# Patient Record
Sex: Female | Born: 1975 | Race: White | Hispanic: No | Marital: Married | Smoking: Never smoker
Health system: Southern US, Community
[De-identification: ages and names within clinical notes are randomized; demographics above are authoritative.]

## PROBLEM LIST (undated history)

## (undated) DIAGNOSIS — R112 Nausea with vomiting, unspecified: Secondary | ICD-10-CM

## (undated) DIAGNOSIS — T7840XA Allergy, unspecified, initial encounter: Secondary | ICD-10-CM

## (undated) DIAGNOSIS — K219 Gastro-esophageal reflux disease without esophagitis: Secondary | ICD-10-CM

## (undated) DIAGNOSIS — G43909 Migraine, unspecified, not intractable, without status migrainosus: Secondary | ICD-10-CM

## (undated) DIAGNOSIS — F419 Anxiety disorder, unspecified: Secondary | ICD-10-CM

## (undated) DIAGNOSIS — I729 Aneurysm of unspecified site: Secondary | ICD-10-CM

## (undated) DIAGNOSIS — I871 Compression of vein: Secondary | ICD-10-CM

## (undated) DIAGNOSIS — Z9889 Other specified postprocedural states: Secondary | ICD-10-CM

## (undated) DIAGNOSIS — I10 Essential (primary) hypertension: Secondary | ICD-10-CM

## (undated) HISTORY — DX: Anxiety disorder, unspecified: F41.9

## (undated) HISTORY — PX: RENAL ANGIOPLASTY: SHX2316

## (undated) HISTORY — DX: Allergy, unspecified, initial encounter: T78.40XA

## (undated) HISTORY — PX: ABDOMINAL HYSTERECTOMY: SHX81

## (undated) HISTORY — DX: Migraine, unspecified, not intractable, without status migrainosus: G43.909

## (undated) HISTORY — PX: APPENDECTOMY: SHX54

## (undated) HISTORY — PX: TUBAL LIGATION: SHX77

## (undated) HISTORY — DX: Aneurysm of unspecified site: I72.9

## (undated) HISTORY — DX: Essential (primary) hypertension: I10

## (undated) HISTORY — DX: Gastro-esophageal reflux disease without esophagitis: K21.9

## (undated) HISTORY — PX: COLONOSCOPY: SHX174

---

## 2004-09-30 HISTORY — PX: OTHER SURGICAL HISTORY: SHX169

## 2014-07-31 LAB — HM MAMMOGRAPHY: HM Mammogram: NORMAL

## 2014-07-31 LAB — HM PAP SMEAR: HM Pap smear: NORMAL

## 2015-01-29 DIAGNOSIS — I729 Aneurysm of unspecified site: Secondary | ICD-10-CM

## 2015-01-29 HISTORY — DX: Aneurysm of unspecified site: I72.9

## 2015-01-29 HISTORY — PX: OTHER SURGICAL HISTORY: SHX169

## 2015-05-18 ENCOUNTER — Ambulatory Visit (INDEPENDENT_AMBULATORY_CARE_PROVIDER_SITE_OTHER): Payer: Managed Care, Other (non HMO) | Admitting: Neurology

## 2015-05-18 ENCOUNTER — Encounter: Payer: Self-pay | Admitting: Neurology

## 2015-05-18 VITALS — BP 126/68 | HR 66 | Resp 20 | Ht 65.0 in | Wt 150.8 lb

## 2015-05-18 DIAGNOSIS — R51 Headache: Secondary | ICD-10-CM

## 2015-05-18 DIAGNOSIS — I7771 Dissection of carotid artery: Secondary | ICD-10-CM

## 2015-05-18 DIAGNOSIS — G4441 Drug-induced headache, not elsewhere classified, intractable: Secondary | ICD-10-CM

## 2015-05-18 DIAGNOSIS — R519 Headache, unspecified: Secondary | ICD-10-CM

## 2015-05-18 DIAGNOSIS — G4440 Drug-induced headache, not elsewhere classified, not intractable: Secondary | ICD-10-CM

## 2015-05-18 MED ORDER — TRAMADOL HCL 50 MG PO TABS: 50.0000 mg | ORAL_TABLET | Freq: Three times a day (TID) | ORAL | Status: DC | PRN

## 2015-05-18 MED ORDER — TOPIRAMATE 50 MG PO TABS: 50.0000 mg | ORAL_TABLET | Freq: Two times a day (BID) | ORAL | Status: DC

## 2015-05-18 NOTE — Progress Notes (Addendum)
NEUROLOGY CONSULTATION NOTE  Samadhi Mahurin MRN: [id number] DOB: Jun 18, 1976  Referring provider: Dr. Gean Maidens (prior neurologist in [place]) Primary care provider: none  Reason for consult:  headache  HISTORY OF PRESENT ILLNESS: Jessica Molina is a 39 year old right-handed female with hypertension and hypotension and recent left internal carotid artery dissection who presents for headache.  History obtained by patient and prior neurologist's notes.  Reports of CTA of neck, MRI and MRA of head reviewed.  In February, she was in the hospital for hypotension.  She has history of fluctuating blood pressure and her blood pressure was in the 50s/30s.  She had several falls.  While in the hospital, it was noted that she had asymmetric pupils.  She denied focal numbness or weakness.  MRI of the brain was unremarkable.  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  She was started on ASA.  Repeat CTA of neck from 01/03/15 showed "mild irregularity of the distal cervical left ICA without significant stenosis, as well as a pseudoaneurysm projecting posteriorly from the distal cervical segment measuring up to 3.3 x 7.3 cm".  She underwent stent and embolization of the left ICA in May by Dr. Darnell Level.  Follow up CTA of the brain on 02/14/15 showed status post stenting of the upper cervical ICA with small residual pseudoaneurysm along the posterior wall, decreased from previous exam.  She was instructed that she could discontinue ASA in late July.  Since February, she has had chronic headaches.  They are located on top and at the crown of her head.  They are of a non-throbbing quality  There were initially severe and constant.  They have since improved.  They are now usually 4-6/10 intensity and rarely severe.  They are rarely associated with nausea but she sometimes notes photophobia or phonophobia.  They typically last  hours to one day.  They still occur daily.  She takes tramadol 50mg  and was taking it daily up until a week ago when she ran out.  She also takes topiramate 50mg  twice daily.  Prior medications included ibuprofen, Tylenol, Excedrin and Fioricet.  She does report past history of migraines when she was younger and she had headaches during her pregnancy with her twins.  She has a follow up with the vascular surgeon, Dr. Adela Glimpse, in December.  PAST MEDICAL HISTORY: Past Medical History  Diagnosis Date  . Headache   . Aneurysm     PAST SURGICAL HISTORY: Past Surgical History  Procedure Laterality Date  . Appendectomy    . Cesarean section      MEDICATIONS: No current outpatient prescriptions on file prior to visit.   No current facility-administered medications on file prior to visit.    ALLERGIES: Allergies  Allergen Reactions  . Lisinopril Cough    FAMILY HISTORY: Family History  Problem Relation Age of Onset  . Cancer Mother     breast   . Thyroid disease Mother   . Hypertension Father   . Stroke Father   . Cancer Father     melanoma   . Cancer Maternal Grandmother     breast   . Thyroid disease Maternal Grandmother   . Cancer Maternal Grandfather     unknown   . Cancer Paternal Grandmother     unknow     SOCIAL HISTORY: Social History   Social History  . Marital Status: Unknown    Spouse Name: N/A  .  Number of Children: N/A  . Years of Education: N/A   Occupational History  . Not on file.   Social History Main Topics  . Smoking status: Never Smoker   . Smokeless tobacco: Never Used  . Alcohol Use: 0.0 oz/week    0 Standard drinks or equivalent per week     Comment: social   . Drug Use: No  . Sexual Activity:    Partners: Male   Other Topics Concern  . Not on file   Social History Narrative  . No narrative on file    REVIEW OF SYSTEMS: Constitutional: No fevers, chills, or sweats, no generalized fatigue, change in appetite Eyes: No visual  changes, double vision, eye pain Ear, nose and throat: No hearing loss, ear pain, nasal congestion, sore throat Cardiovascular: No chest pain, palpitations Respiratory:  No shortness of breath at rest or with exertion, wheezes GastrointestinaI: No nausea, vomiting, diarrhea, abdominal pain, fecal incontinence Genitourinary:  No dysuria, urinary retention or frequency Musculoskeletal:  No neck pain, back pain Integumentary: No rash, pruritus, skin lesions Neurological: as above Psychiatric: No depression, insomnia, anxiety Endocrine: No palpitations, fatigue, diaphoresis, mood swings, change in appetite, change in weight, increased thirst Hematologic/Lymphatic:  No anemia, purpura, petechiae. Allergic/Immunologic: no itchy/runny eyes, nasal congestion, recent allergic reactions, rashes  PHYSICAL EXAM: Filed Vitals:   05/18/15 0829  BP: 126/68  Pulse: 66  Resp: 20   General: No acute distress.  Patient appears well-groomed.  Head:  Normocephalic/atraumatic Eyes:  fundi unremarkable, without vessel changes, exudates, hemorrhages or papilledema. Neck: supple, no paraspinal tenderness, full range of motion Back: No paraspinal tenderness Heart: regular rate and rhythm Lungs: Clear to auscultation bilaterally. Vascular: No carotid bruits. Neurological Exam: Mental status: alert and oriented to person, place, and time, recent and remote memory intact, fund of knowledge intact, attention and concentration intact, speech fluent and not dysarthric, language intact. Cranial nerves: CN I: not tested CN II: OD 2.20mm, OS 2mm, round and reactive to light, visual fields intact, fundi unremarkable, without vessel changes, exudates, hemorrhages or papilledema. CN III, IV, VI:  full range of motion, no nystagmus, no ptosis CN V: facial sensation intact CN VII: upper and lower face symmetric CN VIII: hearing intact CN IX, X: gag intact, uvula midline CN XI: sternocleidomastoid and trapezius muscles  intact CN XII: tongue midline Bulk & Tone: normal, no fasciculations. Motor:  5/5 throughout Sensation:  Pinprick and vibration intact Deep Tendon Reflexes:  2+ throughout, toes downgoing Finger to nose testing:  intact Heel to shin:  intact Gait:  Normal station and stride.  Able to turn and tandem walk. Romberg negative.  IMPRESSION: History of left internal carotid artery dissection, possibly traumatic from one of her falls Chronic daily headache, complicated by medication overuse  Hypertension/fluctuating blood pressure  PLAN: 1.  Continue topiramate  twice daily 2.  Tramadol  as needed for headache, limited to no more than 2 days out of the week. 3.  Will refer to establish with PCP to manage her other medications, such as her antihypertensive medications. 4.  Follow up with Dr. Adela Glimpse as scheduled 5.  Follow up with me in 3 months.  Thank you for allowing me to take part in the care of this patient.  Shon Millet, DO  CC: Gean Maidens, MD  Darnell Level, MD

## 2015-05-18 NOTE — Patient Instructions (Signed)
1.  Continue topiramate  twice daily 2.  Tramadol  as needed up to every 8 hours but take no more than 2 days out of the week 3.  Establish care with primary care provider 4.  Follow up in 3 months.

## 2015-05-22 ENCOUNTER — Telehealth: Payer: Self-pay | Admitting: *Deleted

## 2015-05-22 ENCOUNTER — Encounter: Payer: Self-pay | Admitting: *Deleted

## 2015-05-22 ENCOUNTER — Ambulatory Visit: Payer: 59 | Admitting: Family Medicine

## 2015-05-22 NOTE — Telephone Encounter (Signed)
Pre-Visit Call completed with patient and chart updated.   Pre-Visit Info documented in Specialty Comments under [employer].    

## 2015-05-23 ENCOUNTER — Encounter: Payer: Self-pay | Admitting: Family Medicine

## 2015-05-23 ENCOUNTER — Ambulatory Visit (INDEPENDENT_AMBULATORY_CARE_PROVIDER_SITE_OTHER): Payer: Managed Care, Other (non HMO) | Admitting: Family Medicine

## 2015-05-23 VITALS — BP 128/87 | HR 75 | Temp 98.5°F | Ht 63.0 in | Wt 153.0 lb

## 2015-05-23 DIAGNOSIS — R51 Headache: Secondary | ICD-10-CM

## 2015-05-23 DIAGNOSIS — R519 Headache, unspecified: Secondary | ICD-10-CM

## 2015-05-23 DIAGNOSIS — Z8742 Personal history of other diseases of the female genital tract: Secondary | ICD-10-CM

## 2015-05-23 DIAGNOSIS — G4700 Insomnia, unspecified: Secondary | ICD-10-CM | POA: Diagnosis not present

## 2015-05-23 DIAGNOSIS — I150 Renovascular hypertension: Secondary | ICD-10-CM | POA: Diagnosis not present

## 2015-05-23 DIAGNOSIS — I10 Essential (primary) hypertension: Secondary | ICD-10-CM | POA: Insufficient documentation

## 2015-05-23 DIAGNOSIS — R110 Nausea: Secondary | ICD-10-CM | POA: Diagnosis not present

## 2015-05-23 DIAGNOSIS — Z808 Family history of malignant neoplasm of other organs or systems: Secondary | ICD-10-CM

## 2015-05-23 DIAGNOSIS — I7771 Dissection of carotid artery: Secondary | ICD-10-CM | POA: Insufficient documentation

## 2015-05-23 MED ORDER — ZOLPIDEM TARTRATE ER 12.5 MG PO TBCR: EXTENDED_RELEASE_TABLET | ORAL | Status: DC

## 2015-05-23 MED ORDER — ALPRAZOLAM 0.25 MG PO TABS: 0.2500 mg | ORAL_TABLET | Freq: Two times a day (BID) | ORAL | Status: DC | PRN

## 2015-05-23 MED ORDER — PROMETHAZINE HCL 25 MG PO TABS: ORAL_TABLET | ORAL | Status: DC

## 2015-05-23 MED ORDER — ESCITALOPRAM OXALATE 10 MG PO TABS: 10.0000 mg | ORAL_TABLET | Freq: Every day | ORAL | Status: DC

## 2015-05-23 NOTE — Assessment & Plan Note (Signed)
Cont norvasc and chlorthiadone Per nephrology

## 2015-05-23 NOTE — Progress Notes (Signed)
Pre visit review using our clinic review tool, if applicable. No additional management support is needed unless otherwise documented below in the visit note. 

## 2015-05-23 NOTE — Assessment & Plan Note (Signed)
Per neuro 

## 2015-05-23 NOTE — Patient Instructions (Addendum)

## 2015-05-23 NOTE — Progress Notes (Signed)
Patient ID: Jessica Molina, female   DOB: 04/18/1976, 39 y.o.   MRN: [id number]   Jessica Molina  female [id number] Jul 22, 1976 39 y.o. 05/23/2015      Progress Note-Follow Up  Subjective   HPI  Patient is in today to establish.  She has a hx of renovascular htn and carotid dissection.   Chief Complaint  Patient presents with  . Establish Care    Wants to discuss medication's    Past Medical History  Diagnosis Date  . Headache   . Aneurysm   . Hypertension     Past Surgical History  Procedure Laterality Date  . Appendectomy    . Cesarean section    . Renal angioplasty      Family History  Problem Relation Age of Onset  . Breast cancer Mother     breast   . Thyroid disease Mother   . Cancer Mother 8    breast  . Hypertension Father   . Stroke Father   . Melanoma Father     melanoma   . Cancer Father     melanoma  . Breast cancer Maternal Grandmother     breast   . Thyroid disease Maternal Grandmother   . Cancer Maternal Grandfather     unknown   . Cancer Paternal Grandmother     unknow     Social History   Social History  . Marital Status: Unknown    Spouse Name: N/A  . Number of Children: N/A  . Years of Education: N/A   Occupational History  . Not on file.   Social History Main Topics  . Smoking status: Never Smoker   . Smokeless tobacco: Never Used  . Alcohol Use: 0.0 oz/week    0 Standard drinks or equivalent per week     Comment: social   . Drug Use: No  . Sexual Activity:    Partners: Male   Other Topics Concern  . Not on file   Social History Narrative    Current Outpatient Prescriptions on File Prior to Visit  Medication Sig Dispense Refill  . amLODipine (NORVASC) 10 MG tablet once daily.     . chlorthalidone (HYGROTON) 25 MG tablet Take 25 mg by mouth daily as needed.     Marland Kitchen losartan (COZAAR) 100 MG tablet Take 100 mg by mouth daily.    Marland Kitchen oxyCODONE-acetaminophen (PERCOCET/ROXICET) 5-325 MG per tablet ! tablet by motuh as  needed    . topiramate (TOPAMAX) 50 MG tablet Take 1 tablet (50 mg total) by mouth 2 (two) times daily. 60 tablet 6  . traMADol (ULTRAM) 50 MG tablet Take 1 tablet (50 mg total) by mouth every 8 (eight) hours as needed. 240 tablet 0   No current facility-administered medications on file prior to visit.    Allergies  Allergen Reactions  . Lisinopril Cough    Review of Systems  Review of Systems  Constitutional: Negative for fever, chills and malaise/fatigue.  HENT: Negative for congestion and hearing loss.   Eyes: Negative for discharge.  Respiratory: Negative for cough, sputum production and shortness of breath.   Cardiovascular: Negative for chest pain, palpitations and leg swelling.  Gastrointestinal: Negative for heartburn, nausea, vomiting, abdominal pain, diarrhea, constipation and blood in stool.  Genitourinary: Negative for dysuria, urgency, frequency and hematuria.  Musculoskeletal: Negative for myalgias, back pain and falls.  Skin: Negative for rash.  Neurological: Negative for dizziness, sensory change, loss of consciousness, weakness and headaches.  Endo/Heme/Allergies: Negative for environmental allergies.  Does not bruise/bleed easily.  Psychiatric/Behavioral: Negative for depression and suicidal ideas. The patient is not nervous/anxious and does not have insomnia.     Objective  Filed Vitals:   05/23/15 0837  BP: 128/87  Pulse: 75  Temp: 98.5 F (36.9 C)  TempSrc: Oral  Height: 5' 3" (1.6 m)  Weight: 153 lb (69.4 kg)  SpO2: 100%   Body mass index is 27.11 kg/(m^2).  Physical Exam  Physical Exam  Constitutional: She is oriented to person, place, and time and well-developed, well-nourished, and in no distress. No distress.  HENT:  Right Ear: External ear normal.  Left Ear: External ear normal.  Nose: Nose normal.  Mouth/Throat: Oropharynx is clear and moist.  Eyes: EOM are normal. Pupils are equal, round, and reactive to light. Right eye exhibits no  discharge. Left eye exhibits no discharge.  Neck: Normal range of motion. Neck supple.  Cardiovascular: Normal rate, regular rhythm and intact distal pulses.   No murmur heard. Pulmonary/Chest: Effort normal. No respiratory distress. She has no wheezes. She has no rales. She exhibits no tenderness.  Abdominal: Soft. She exhibits no distension and no mass. There is no tenderness. There is no rebound and no guarding.  Musculoskeletal: Normal range of motion. She exhibits no edema or tenderness.  Lymphadenopathy:    She has no cervical adenopathy.  Neurological: She is alert and oriented to person, place, and time. Gait normal.  Skin: No rash noted. She is not diaphoretic. No erythema.  Psychiatric: Affect and judgment normal.  Nursing note and vitals reviewed.   No results found for: TSH No results found for: WBC, HGB, HCT, MCV, PLT No results found for: EGFR, GFR No results found for: CHOL No results found for: HDL No results found for: LDLCALC No results found for: TRIG No results found for: CHOLHDL No results found for: HGBA1C    Assessment & Plan  1. Insomnia Refill ambien  - zolpidem (AMBIEN CR) 12.5 MG CR tablet; 1 tab by mouth nightly  Dispense: 30 tablet; Refill: 0  2. Nausea without vomiting  - promethazine (PHENERGAN) 25 MG tablet; Take 1 tablet by mouth once or twice a week as needed  Dispense: 30 tablet; Refill: 0  3. Hx carotid dissection-- per neurosurgeon in [place]  4 renovascular htn-- con't norvasc and chlorthiadone per nephrology

## 2015-05-23 NOTE — Assessment & Plan Note (Signed)
Surgery done in [place]  Still sees him

## 2015-06-28 ENCOUNTER — Other Ambulatory Visit: Payer: Self-pay | Admitting: Family Medicine

## 2015-06-28 NOTE — Telephone Encounter (Signed)
Pt was seen on 05/23/15 Last refill was 05/23/15  Please advise on refill.

## 2015-06-29 MED ORDER — ALPRAZOLAM 0.25 MG PO TABS: 0.2500 mg | ORAL_TABLET | Freq: Two times a day (BID) | ORAL | Status: DC | PRN

## 2015-06-29 MED ORDER — ZOLPIDEM TARTRATE ER 12.5 MG PO TBCR: 12.5000 mg | EXTENDED_RELEASE_TABLET | Freq: Every day | ORAL | Status: DC

## 2015-06-29 NOTE — Addendum Note (Signed)
Addended by: Neldon Labella on: 06/29/2015 08:15 AM   Modules accepted: Orders

## 2015-07-12 ENCOUNTER — Other Ambulatory Visit (HOSPITAL_COMMUNITY): Payer: Self-pay | Admitting: Neurosurgery

## 2015-07-12 DIAGNOSIS — I720 Aneurysm of carotid artery: Secondary | ICD-10-CM

## 2015-07-19 ENCOUNTER — Ambulatory Visit (HOSPITAL_COMMUNITY): Payer: Managed Care, Other (non HMO)

## 2015-07-25 ENCOUNTER — Ambulatory Visit (HOSPITAL_COMMUNITY): Payer: Managed Care, Other (non HMO)

## 2015-07-27 ENCOUNTER — Ambulatory Visit (HOSPITAL_COMMUNITY): Payer: Managed Care, Other (non HMO)

## 2015-08-01 ENCOUNTER — Other Ambulatory Visit: Payer: Self-pay

## 2015-08-01 MED ORDER — TOPIRAMATE 50 MG PO TABS: 50.0000 mg | ORAL_TABLET | Freq: Two times a day (BID) | ORAL | Status: DC

## 2015-08-01 NOTE — Telephone Encounter (Signed)
Last OV: 05/18/15 Next OV: 08/22/15

## 2015-08-02 ENCOUNTER — Other Ambulatory Visit: Payer: Self-pay | Admitting: Family Medicine

## 2015-08-02 ENCOUNTER — Ambulatory Visit (HOSPITAL_COMMUNITY)
Admission: RE | Admit: 2015-08-02 | Discharge: 2015-08-02 | Disposition: A | Discharge status: Home / Self Care | Payer: Managed Care, Other (non HMO) | Source: Ambulatory Visit | Attending: Neurosurgery | Admitting: Neurosurgery

## 2015-08-02 ENCOUNTER — Encounter (HOSPITAL_COMMUNITY): Payer: Self-pay

## 2015-08-02 DIAGNOSIS — R51 Headache: Secondary | ICD-10-CM | POA: Insufficient documentation

## 2015-08-02 DIAGNOSIS — I720 Aneurysm of carotid artery: Secondary | ICD-10-CM | POA: Diagnosis present

## 2015-08-02 DIAGNOSIS — I773 Arterial fibromuscular dysplasia: Secondary | ICD-10-CM | POA: Insufficient documentation

## 2015-08-02 DIAGNOSIS — Z95828 Presence of other vascular implants and grafts: Secondary | ICD-10-CM | POA: Diagnosis not present

## 2015-08-02 MED ORDER — IOHEXOL 350 MG/ML SOLN
100.0000 mL | Freq: Once | INTRAVENOUS | Status: AC | PRN
  Administered 2015-08-02: 100 mL via INTRAVENOUS

## 2015-08-03 NOTE — Telephone Encounter (Signed)
Last seen 05/23/15 and filled 06/29/15 #30  Please advise     KP

## 2015-08-15 ENCOUNTER — Other Ambulatory Visit: Payer: Self-pay

## 2015-08-15 MED ORDER — ESCITALOPRAM OXALATE 10 MG PO TABS: 10.0000 mg | ORAL_TABLET | Freq: Every day | ORAL | Status: DC

## 2015-08-22 ENCOUNTER — Ambulatory Visit: Payer: 59 | Admitting: Neurology

## 2015-08-29 ENCOUNTER — Other Ambulatory Visit: Payer: Self-pay | Admitting: Family Medicine

## 2015-08-30 NOTE — Telephone Encounter (Signed)
Last OV 05/23/15 Alprazolam last filled 06/29/15 #30 with 0 ambien last filled 08/03/15 #30 with 0  No CSC on file

## 2015-08-30 NOTE — Telephone Encounter (Signed)
Medication filled to pharmacy as requested.   

## 2015-09-02 ENCOUNTER — Other Ambulatory Visit: Payer: Self-pay | Admitting: Family Medicine

## 2015-09-04 ENCOUNTER — Other Ambulatory Visit: Payer: Self-pay | Admitting: Family Medicine

## 2015-09-04 MED ORDER — ZOLPIDEM TARTRATE ER 12.5 MG PO TBCR: 12.5000 mg | EXTENDED_RELEASE_TABLET | Freq: Every day | ORAL | Status: DC

## 2015-09-04 NOTE — Addendum Note (Signed)
Addended by: Geannie RisenBRODMERKEL, JESSICA L on: 09/04/2015 04:29 PM   Modules accepted: Orders

## 2015-09-04 NOTE — Telephone Encounter (Signed)
Medication filled to pharmacy as requested.   

## 2015-09-04 NOTE — Telephone Encounter (Signed)
Ok for Ambien refill #30

## 2015-09-04 NOTE — Telephone Encounter (Signed)
Last OV 823/16 ambien last filled 08/30/15 #30 with 0

## 2015-09-04 NOTE — Telephone Encounter (Signed)
Please advise, two requests come in on this/

## 2015-09-04 NOTE — Telephone Encounter (Signed)
Med denied, faxed to pharmacy on 08/30/15

## 2015-09-04 NOTE — Telephone Encounter (Signed)
Pt called in to fu on Rx refill request. Pt says that she is now all out. She says that the pharmacy asked that she call in to request refill.   Medication: AMBIEN CR  CB: [phone]  Pharmacy: [id number] - [place], [place] - [address]

## 2015-10-02 ENCOUNTER — Other Ambulatory Visit: Payer: Self-pay | Admitting: Physician Assistant

## 2015-10-02 NOTE — Telephone Encounter (Signed)
Will defer further refills of patient's medications to PCP  

## 2015-10-03 NOTE — Telephone Encounter (Signed)
Last seen 05/23/15 and filled 08/30/15 #30  Please advise     KP

## 2015-10-05 ENCOUNTER — Other Ambulatory Visit: Payer: Self-pay | Admitting: Family Medicine

## 2015-10-25 ENCOUNTER — Other Ambulatory Visit: Payer: Self-pay | Admitting: Neurology

## 2015-10-25 NOTE — Telephone Encounter (Signed)
Last OV: 05/18/15 Next OV: 12/19/15 1.  Continue topiramate  twice daily

## 2015-11-07 ENCOUNTER — Other Ambulatory Visit (HOSPITAL_COMMUNITY)
Admission: RE | Admit: 2015-11-07 | Discharge: 2015-11-07 | Disposition: A | Discharge status: Home / Self Care | Payer: Managed Care, Other (non HMO) | Source: Ambulatory Visit | Attending: Women's Health | Admitting: Women's Health

## 2015-11-07 ENCOUNTER — Encounter: Payer: Self-pay | Admitting: Women's Health

## 2015-11-07 ENCOUNTER — Ambulatory Visit (INDEPENDENT_AMBULATORY_CARE_PROVIDER_SITE_OTHER): Payer: Managed Care, Other (non HMO) | Admitting: Women's Health

## 2015-11-07 ENCOUNTER — Other Ambulatory Visit: Payer: Self-pay | Admitting: Family Medicine

## 2015-11-07 VITALS — BP 128/80 | Ht 63.0 in | Wt 136.0 lb

## 2015-11-07 DIAGNOSIS — Z01419 Encounter for gynecological examination (general) (routine) without abnormal findings: Secondary | ICD-10-CM | POA: Diagnosis not present

## 2015-11-07 DIAGNOSIS — Z1151 Encounter for screening for human papillomavirus (HPV): Secondary | ICD-10-CM | POA: Insufficient documentation

## 2015-11-07 DIAGNOSIS — N920 Excessive and frequent menstruation with regular cycle: Secondary | ICD-10-CM

## 2015-11-07 LAB — CBC WITH DIFFERENTIAL/PLATELET
Basophils Absolute: 0 10*3/uL (ref 0.0–0.1)
Basophils Relative: 0 % (ref 0–1)
Eosinophils Absolute: 0 10*3/uL (ref 0.0–0.7)
Eosinophils Relative: 0 % (ref 0–5)
HCT: 45.8 % (ref 36.0–46.0)
Hemoglobin: 14.9 g/dL (ref 12.0–15.0)
Lymphocytes Relative: 17 % (ref 12–46)
Lymphs Abs: 1.5 10*3/uL (ref 0.7–4.0)
MCH: 27.7 pg (ref 26.0–34.0)
MCHC: 32.5 g/dL (ref 30.0–36.0)
MCV: 85.3 fL (ref 78.0–100.0)
MPV: 11.1 fL (ref 8.6–12.4)
Monocytes Absolute: 0.4 10*3/uL (ref 0.1–1.0)
Monocytes Relative: 4 % (ref 3–12)
Neutro Abs: 7 10*3/uL (ref 1.7–7.7)
Neutrophils Relative %: 79 % — ABNORMAL HIGH (ref 43–77)
Platelets: 438 10*3/uL — ABNORMAL HIGH (ref 150–400)
RBC: 5.37 MIL/uL — ABNORMAL HIGH (ref 3.87–5.11)
RDW: 14.1 % (ref 11.5–15.5)
WBC: 8.9 10*3/uL (ref 4.0–10.5)

## 2015-11-07 LAB — TSH: TSH: 0.72 mIU/L

## 2015-11-07 LAB — PROLACTIN: Prolactin: 3 ng/mL

## 2015-11-07 NOTE — Patient Instructions (Addendum)
Menorrhagia Menorrhagia is the medical term for when your menstrual periods are heavy or last longer than usual. With menorrhagia, every period you have may cause enough blood loss and cramping that you are unable to maintain your usual activities. CAUSES  In some cases, the cause of heavy periods is unknown, but a number of conditions may cause menorrhagia. Common causes include:  A problem with the hormone-producing thyroid gland (hypothyroid).  Noncancerous growths in the uterus (polyps or fibroids).  An imbalance of the estrogen and progesterone hormones.  One of your ovaries not releasing an egg during one or more months.  Side effects of having an intrauterine device (IUD).  Side effects of some medicines, such as anti-inflammatory medicines or blood thinners.  A bleeding disorder that stops your blood from clotting normally. SIGNS AND SYMPTOMS  During a normal period, bleeding lasts between 4 and 8 days. Signs that your periods are too heavy include:  You routinely have to change your pad or tampon every 1 or 2 hours because it is completely soaked.  You pass blood clots larger than 1 inch (2.5 cm) in size.  You have bleeding for more than 7 days.  You need to use pads and tampons at the same time because of heavy bleeding.  You need to wake up to change your pads or tampons during the night.  You have symptoms of anemia, such as tiredness, fatigue, or shortness of breath. DIAGNOSIS  Your health care provider will perform a physical exam and ask you questions about your symptoms and menstrual history. Other tests may be ordered based on what the health care provider finds during the exam. These tests can include:  Blood tests. Blood tests are used to check if you are pregnant or have hormonal changes, a bleeding or thyroid disorder, low iron levels (anemia), or other problems.  Endometrial biopsy. Your health care provider takes a sample of tissue from the inside of your  uterus to be examined under a microscope.  Pelvic ultrasound. This test uses sound waves to make a picture of your uterus, ovaries, and vagina. The pictures can show if you have fibroids or other growths.  Hysteroscopy. For this test, your health care provider will use a small telescope to look inside your uterus. Based on the results of your initial tests, your health care provider may recommend further testing. TREATMENT  Treatment may not be needed. If it is needed, your health care provider may recommend treatment with one or more medicines first. If these do not reduce bleeding enough, a surgical treatment might be an option. The best treatment for you will depend on:   Whether you need to prevent pregnancy.  Your desire to have children in the future.  The cause and severity of your bleeding.  Your opinion and personal preference.  Medicines for menorrhagia may include:  Birth control methods that use hormones. These include the pill, skin patch, vaginal ring, shots that you get every 3 months, hormonal IUD, and implant. These treatments reduce bleeding during your menstrual period.  Medicines that thicken blood and slow bleeding.  Medicines that reduce swelling, such as ibuprofen.  Medicines that contain a synthetic hormone called progestin.   Medicines that make the ovaries stop working for a short time.  You may need surgical treatment for menorrhagia if the medicines are unsuccessful. Treatment options include:  Dilation and curettage (D&C). In this procedure, your health care provider opens (dilates) your cervix and then scrapes or suctions tissue from  the lining of your uterus to reduce menstrual bleeding.  Operative hysteroscopy. This procedure uses a tiny tube with a light (hysteroscope) to view your uterine cavity and can help in the surgical removal of a polyp that may be causing heavy periods.  Endometrial ablation. Through various techniques, your health care  provider permanently destroys the entire lining of your uterus (endometrium). After endometrial ablation, most women have little or no menstrual flow. Endometrial ablation reduces your ability to become pregnant.  Endometrial resection. This surgical procedure uses an electrosurgical wire loop to remove the lining of the uterus. This procedure also reduces your ability to become pregnant.  Hysterectomy. Surgical removal of the uterus and cervix is a permanent procedure that stops menstrual periods. Pregnancy is not possible after a hysterectomy. This procedure requires anesthesia and hospitalization. HOME CARE INSTRUCTIONS   Only take over-the-counter or prescription medicines as directed by your health care provider. Take prescribed medicines exactly as directed. Do not change or switch medicines without consulting your health care provider.  Take any prescribed iron pills exactly as directed by your health care provider. Long-term heavy bleeding may result in low iron levels. Iron pills help replace the iron your body lost from heavy bleeding. Iron may cause constipation. If this becomes a problem, increase the bran, fruits, and roughage in your diet.  Do not take aspirin or medicines that contain aspirin 1 week before or during your menstrual period. Aspirin may make the bleeding worse.  If you need to change your sanitary pad or tampon more than once every 2 hours, stay in bed and rest as much as possible until the bleeding stops.  Eat well-balanced meals. Eat foods high in iron. Examples are leafy green vegetables, meat, liver, eggs, and whole grain breads and cereals. Do not try to lose weight until the abnormal bleeding has stopped and your blood iron level is back to normal. SEEK MEDICAL CARE IF:   You soak through a pad or tampon every 1 or 2 hours, and this happens every time you have a period.  You need to use pads and tampons at the same time because you are bleeding so much.  You  need to change your pad or tampon during the night.  You have a period that lasts for more than 8 days.  You pass clots bigger than 1 inch wide.  You have irregular periods that happen more or less often than once a month.  You feel dizzy or faint.  You feel very weak or tired.  You feel short of breath or feel your heart is beating too fast when you exercise.  You have nausea and vomiting or diarrhea while you are taking your medicine.  You have any problems that may be related to the medicine you are taking. SEEK IMMEDIATE MEDICAL CARE IF:   You soak through 4 or more pads or tampons in 2 hours.  You have any bleeding while you are pregnant. MAKE SURE YOU:   Understand these instructions.  Will watch your condition.  Will get help right away if you are not doing well or get worse.   This information is not intended to replace advice given to you by your health care provider. Make sure you discuss any questions you have with your health care provider.   Document Released: 09/16/2005 Document Revised: 09/21/2013 Document Reviewed: 03/07/2013 Elsevier Interactive Patient Education 2016 [place] Maintenance, Female Adopting a healthy lifestyle and getting preventive care can go a long way  to promote health and wellness. Talk with your health care provider about what schedule of regular examinations is right for you. This is a good chance for you to check in with your provider about disease prevention and staying healthy. In between checkups, there are plenty of things you can do on your own. Experts have done a lot of research about which lifestyle changes and preventive measures are most likely to keep you healthy. Ask your health care provider for more information. WEIGHT AND DIET  Eat a healthy diet  Be sure to include plenty of vegetables, fruits, low-fat dairy products, and lean protein.  Do not eat a lot of foods high in solid fats, added sugars, or  salt.  Get regular exercise. This is one of the most important things you can do for your health.  Most adults should exercise for at least 150 minutes each week. The exercise should increase your heart rate and make you sweat (moderate-intensity exercise).  Most adults should also do strengthening exercises at least twice a week. This is in addition to the moderate-intensity exercise.  Maintain a healthy weight  Body mass index (BMI) is a measurement that can be used to identify possible weight problems. It estimates body fat based on height and weight. Your health care provider can help determine your BMI and help you achieve or maintain a healthy weight.  For females 37 years of age and older:   A BMI below 18.5 is considered underweight.  A BMI of 18.5 to 24.9 is normal.  A BMI of 25 to 29.9 is considered overweight.  A BMI of 30 and above is considered obese.  Watch levels of cholesterol and blood lipids  You should start having your blood tested for lipids and cholesterol at 41 years of age, then have this test every 5 years.  You may need to have your cholesterol levels checked more often if:  Your lipid or cholesterol levels are high.  You are older than 41 years of age.  You are at high risk for heart disease.  CANCER SCREENING   Lung Cancer  Lung cancer screening is recommended for adults 43-97 years old who are at high risk for lung cancer because of a history of smoking.  A yearly low-dose CT scan of the lungs is recommended for people who:  Currently smoke.  Have quit within the past 15 years.  Have at least a 30-pack-year history of smoking. A pack year is smoking an average of one pack of cigarettes a day for 1 year.  Yearly screening should continue until it has been 15 years since you quit.  Yearly screening should stop if you develop a health problem that would prevent you from having lung cancer treatment.  Breast Cancer  Practice breast  self-awareness. This means understanding how your breasts normally appear and feel.  It also means doing regular breast self-exams. Let your health care provider know about any changes, no matter how small.  If you are in your 20s or 30s, you should have a clinical breast exam (CBE) by a health care provider every 1-3 years as part of a regular health exam.  If you are 74 or older, have a CBE every year. Also consider having a breast X-ray (mammogram) every year.  If you have a family history of breast cancer, talk to your health care provider about genetic screening.  If you are at high risk for breast cancer, talk to your health care provider about having  an MRI and a mammogram every year.  Breast cancer gene (BRCA) assessment is recommended for women who have family members with BRCA-related cancers. BRCA-related cancers include:  Breast.  Ovarian.  Tubal.  Peritoneal cancers.  Results of the assessment will determine the need for genetic counseling and BRCA1 and BRCA2 testing. Cervical Cancer Your health care provider may recommend that you be screened regularly for cancer of the pelvic organs (ovaries, uterus, and vagina). This screening involves a pelvic examination, including checking for microscopic changes to the surface of your cervix (Pap test). You may be encouraged to have this screening done every 3 years, beginning at age 52.  For women ages 30-65, health care providers may recommend pelvic exams and Pap testing every 3 years, or they may recommend the Pap and pelvic exam, combined with testing for human papilloma virus (HPV), every 5 years. Some types of HPV increase your risk of cervical cancer. Testing for HPV may also be done on women of any age with unclear Pap test results.  Other health care providers may not recommend any screening for nonpregnant women who are considered low risk for pelvic cancer and who do not have symptoms. Ask your health care provider if a  screening pelvic exam is right for you.  If you have had past treatment for cervical cancer or a condition that could lead to cancer, you need Pap tests and screening for cancer for at least 20 years after your treatment. If Pap tests have been discontinued, your risk factors (such as having a new sexual partner) need to be reassessed to determine if screening should resume. Some women have medical problems that increase the chance of getting cervical cancer. In these cases, your health care provider may recommend more frequent screening and Pap tests. Colorectal Cancer  This type of cancer can be detected and often prevented.  Routine colorectal cancer screening usually begins at 41 years of age and continues through 41 years of age.  Your health care provider may recommend screening at an earlier age if you have risk factors for colon cancer.  Your health care provider may also recommend using home test kits to check for hidden blood in the stool.  A small camera at the end of a tube can be used to examine your colon directly (sigmoidoscopy or colonoscopy). This is done to check for the earliest forms of colorectal cancer.  Routine screening usually begins at age 8.  Direct examination of the colon should be repeated every 5-10 years through 41 years of age. However, you may need to be screened more often if early forms of precancerous polyps or small growths are found. Skin Cancer  Check your skin from head to toe regularly.  Tell your health care provider about any new moles or changes in moles, especially if there is a change in a mole's shape or color.  Also tell your health care provider if you have a mole that is larger than the size of a pencil eraser.  Always use sunscreen. Apply sunscreen liberally and repeatedly throughout the day.  Protect yourself by wearing long sleeves, pants, a wide-brimmed hat, and sunglasses whenever you are outside. HEART DISEASE, DIABETES, AND HIGH  BLOOD PRESSURE   High blood pressure causes heart disease and increases the risk of stroke. High blood pressure is more likely to develop in:  People who have blood pressure in the high end of the normal range (130-139/85-89 mm Hg).  People who are overweight or obese.  People who are African American.  If you are 85-51 years of age, have your blood pressure checked every 3-5 years. If you are 92 years of age or older, have your blood pressure checked every year. You should have your blood pressure measured twice--once when you are at a hospital or clinic, and once when you are not at a hospital or clinic. Record the average of the two measurements. To check your blood pressure when you are not at a hospital or clinic, you can use:  An automated blood pressure machine at a pharmacy.  A home blood pressure monitor.  If you are between 16 years and 78 years old, ask your health care provider if you should take aspirin to prevent strokes.  Have regular diabetes screenings. This involves taking a blood sample to check your fasting blood sugar level.  If you are at a normal weight and have a low risk for diabetes, have this test once every three years after 41 years of age.  If you are overweight and have a high risk for diabetes, consider being tested at a younger age or more often. PREVENTING INFECTION  Hepatitis B  If you have a higher risk for hepatitis B, you should be screened for this virus. You are considered at high risk for hepatitis B if:  You were born in a country where hepatitis B is common. Ask your health care provider which countries are considered high risk.  Your parents were born in a high-risk country, and you have not been immunized against hepatitis B (hepatitis B vaccine).  You have HIV or AIDS.  You use needles to inject street drugs.  You live with someone who has hepatitis B.  You have had sex with someone who has hepatitis B.  You get hemodialysis  treatment.  You take certain medicines for conditions, including cancer, organ transplantation, and autoimmune conditions. Hepatitis C  Blood testing is recommended for:  Everyone born from [birth year] through 1966.  Anyone with known risk factors for hepatitis C. Sexually transmitted infections (STIs)  You should be screened for sexually transmitted infections (STIs) including gonorrhea and chlamydia if:  You are sexually active and are younger than 41 years of age.  You are older than 41 years of age and your health care provider tells you that you are at risk for this type of infection.  Your sexual activity has changed since you were last screened and you are at an increased risk for chlamydia or gonorrhea. Ask your health care provider if you are at risk.  If you do not have HIV, but are at risk, it may be recommended that you take a prescription medicine daily to prevent HIV infection. This is called pre-exposure prophylaxis (PrEP). You are considered at risk if:  You are sexually active and do not regularly use condoms or know the HIV status of your partner(s).  You take drugs by injection.  You are sexually active with a partner who has HIV. Talk with your health care provider about whether you are at high risk of being infected with HIV. If you choose to begin PrEP, you should first be tested for HIV. You should then be tested every 3 months for as long as you are taking PrEP.  PREGNANCY   If you are premenopausal and you may become pregnant, ask your health care provider about preconception counseling.  If you may become pregnant, take 400 to 800 micrograms (mcg) of folic acid every day.  If you  want to prevent pregnancy, talk to your health care provider about birth control (contraception). OSTEOPOROSIS AND MENOPAUSE   Osteoporosis is a disease in which the bones lose minerals and strength with aging. This can result in serious bone fractures. Your risk for osteoporosis can  be identified using a bone density scan.  If you are 25 years of age or older, or if you are at risk for osteoporosis and fractures, ask your health care provider if you should be screened.  Ask your health care provider whether you should take a calcium or vitamin D supplement to lower your risk for osteoporosis.  Menopause may have certain physical symptoms and risks.  Hormone replacement therapy may reduce some of these symptoms and risks. Talk to your health care provider about whether hormone replacement therapy is right for you.  HOME CARE INSTRUCTIONS   Schedule regular health, dental, and eye exams.  Stay current with your immunizations.   Do not use any tobacco products including cigarettes, chewing tobacco, or electronic cigarettes.  If you are pregnant, do not drink alcohol.  If you are breastfeeding, limit how much and how often you drink alcohol.  Limit alcohol intake to no more than 1 drink per day for nonpregnant women. One drink equals 12 ounces of beer, 5 ounces of wine, or 1 ounces of hard liquor.  Do not use street drugs.  Do not share needles.  Ask your health care provider for help if you need support or information about quitting drugs.  Tell your health care provider if you often feel depressed.  Tell your health care provider if you have ever been abused or do not feel safe at home.   This information is not intended to replace advice given to you by your health care provider. Make sure you discuss any questions you have with your health care provider.   Document Released: 04/01/2011 Document Revised: 10/07/2014 Document Reviewed: 08/18/2013 Elsevier Interactive Patient Education 2016 [place]   [id number]

## 2015-11-07 NOTE — Telephone Encounter (Signed)
Last seen 05/23/15 and filled 10/05/15 #30  Please advise    KP

## 2015-11-07 NOTE — Addendum Note (Signed)
Addended by: Kem Parkinson on: 11/07/2015 09:48 AM   Modules accepted: Orders

## 2015-11-07 NOTE — Progress Notes (Signed)
Jessica Molina December 01, 1975 [id number]    History:    Presents for annual exam.  Regular monthly cycle, for the past 5 months cycles have been lasting up to 2 weeks heavy changing pad and tampon every 1-2 hours on the heaviest days. BTL. Cycles lasting 6-7 days prior. Same partner greater than 17 years. 2004 LEEP in [place] with normal Paps after. Had a normal screening mammogram. Mother died of breast cancer with metastasis to bone, first diagnosed with breast cancer at about age 24. Recently moved here from [place].   Past medical history, past surgical history, family history and social history were all reviewed and documented in the EPIC chart. CNA. 4 daughters ages 23, 76, 38 year old twins.  ROS:  A ROS was performed and pertinent positives and negatives are included.  Exam:  Filed Vitals:   11/07/15 0854  BP: 128/80    General appearance:  Normal Thyroid:  Symmetrical, normal in size, without palpable masses or nodularity. Respiratory  Auscultation:  Clear without wheezing or rhonchi Cardiovascular  Auscultation:  Regular rate, without rubs, murmurs or gallops  Edema/varicosities:  Not grossly evident Abdominal  Soft,nontender, without masses, guarding or rebound.  Liver/spleen:  No organomegaly noted  Hernia:  None appreciated  Skin  Inspection:  Grossly normal   Breasts: Examined lying and sitting.     Right: Without masses, retractions, discharge or axillary adenopathy.     Left: Without masses, retractions, discharge or axillary adenopathy. Gentitourinary   Inguinal/mons:  Normal without inguinal adenopathy  External genitalia:  Normal  BUS/Urethra/Skene's glands:  Normal  Vagina:  Normal  Cervix:  Normal  Uterus:  Retroverted, normal in size, shape and contour.  Midline and mobile  Adnexa/parametria:     Rt: Without masses or tenderness.   Lt: Without masses or tenderness.  Anus and perineum: Normal  Digital rectal exam: Normal sphincter tone without palpated  masses or tenderness  Assessment/Plan:  40 y.o. MWF G3 P4 for annual exam.   Monthly cycle, past 5 months lasting 2 weeks heavy./Dyspareunia/BTL 2004 LEEP normal Paps after Mother breast cancer age 62 BRCA unknown deceased Hypertension-managed by [facility] meds and  labs  Plan: CBC, lipid panel, TSH, UA, Pap with HR HPV typing. Schedule sonohysterogram with Dr. Phineas Real after next cycle. SBE's, annual screening mammogram 3-D tomography reviewed and encouraged breast center information given instructed to schedule. Encouraged regular exercise, calcium rich diet, MVI daily, vitamin D 1000 daily encouraged.    Huel Cote [facility], 9:37 AM 11/07/2015

## 2015-11-08 ENCOUNTER — Telehealth: Payer: Self-pay | Admitting: Family Medicine

## 2015-11-08 LAB — URINALYSIS W MICROSCOPIC + REFLEX CULTURE
Bacteria, UA: NONE SEEN [HPF]
Bilirubin Urine: NEGATIVE
Casts: NONE SEEN [LPF]
Crystals: NONE SEEN [HPF]
Glucose, UA: NEGATIVE
Hgb urine dipstick: NEGATIVE
Ketones, ur: NEGATIVE
Leukocytes, UA: NEGATIVE
Nitrite: NEGATIVE
Protein, ur: NEGATIVE
Specific Gravity, Urine: 1.017 (ref 1.001–1.035)
pH: 8 (ref 5.0–8.0)

## 2015-11-08 LAB — CYTOLOGY - PAP

## 2015-11-08 NOTE — Telephone Encounter (Signed)
Updated.      KP 

## 2015-11-08 NOTE — Telephone Encounter (Signed)
Patient had her Flu Shot in late December 2016 @ [facility]

## 2015-11-09 ENCOUNTER — Other Ambulatory Visit: Payer: Self-pay | Admitting: Women's Health

## 2015-11-09 ENCOUNTER — Other Ambulatory Visit: Payer: Self-pay

## 2015-11-09 DIAGNOSIS — Z1231 Encounter for screening mammogram for malignant neoplasm of breast: Secondary | ICD-10-CM

## 2015-11-09 LAB — URINE CULTURE: Colony Count: >=100000

## 2015-11-09 MED ORDER — FLUCONAZOLE 150 MG PO TABS: 150.0000 mg | ORAL_TABLET | Freq: Once | ORAL | Status: DC

## 2015-11-11 ENCOUNTER — Other Ambulatory Visit: Payer: Self-pay | Admitting: Neurology

## 2015-11-13 ENCOUNTER — Other Ambulatory Visit: Payer: Self-pay | Admitting: Gynecology

## 2015-11-13 DIAGNOSIS — N939 Abnormal uterine and vaginal bleeding, unspecified: Secondary | ICD-10-CM

## 2015-11-13 NOTE — Telephone Encounter (Signed)
Last OV: 05/18/15 Next OV: 12/19/15

## 2015-11-21 ENCOUNTER — Telehealth: Payer: Self-pay | Admitting: Gynecology

## 2015-11-21 NOTE — Telephone Encounter (Signed)
11/21/15-I told pt today that her [employer] ins will cover the sonohysterogram and bx if needed with her $30 copay. Per [phone] [email or web address]

## 2015-11-23 ENCOUNTER — Ambulatory Visit: Payer: Managed Care, Other (non HMO) | Admitting: Family Medicine

## 2015-11-23 ENCOUNTER — Other Ambulatory Visit: Payer: Managed Care, Other (non HMO)

## 2015-11-23 ENCOUNTER — Ambulatory Visit: Payer: Managed Care, Other (non HMO) | Admitting: Gynecology

## 2015-11-27 ENCOUNTER — Other Ambulatory Visit: Payer: Self-pay | Admitting: Family Medicine

## 2015-11-27 NOTE — Telephone Encounter (Signed)
Last seen 05/23/15 and filled 10/03/15 #30  Please advise    KP

## 2015-11-27 NOTE — Telephone Encounter (Signed)
Faxed.   KP 

## 2015-11-29 ENCOUNTER — Other Ambulatory Visit: Payer: Self-pay | Admitting: Gynecology

## 2015-11-29 ENCOUNTER — Encounter: Payer: Self-pay | Admitting: Gynecology

## 2015-11-29 ENCOUNTER — Ambulatory Visit (INDEPENDENT_AMBULATORY_CARE_PROVIDER_SITE_OTHER): Payer: Managed Care, Other (non HMO)

## 2015-11-29 ENCOUNTER — Ambulatory Visit (INDEPENDENT_AMBULATORY_CARE_PROVIDER_SITE_OTHER): Payer: Managed Care, Other (non HMO) | Admitting: Gynecology

## 2015-11-29 VITALS — BP 120/76

## 2015-11-29 DIAGNOSIS — N920 Excessive and frequent menstruation with regular cycle: Secondary | ICD-10-CM

## 2015-11-29 DIAGNOSIS — N939 Abnormal uterine and vaginal bleeding, unspecified: Secondary | ICD-10-CM | POA: Diagnosis not present

## 2015-11-29 NOTE — Patient Instructions (Signed)
Office will call with the biopsy results.  You will be thinking about her options to include observation, hormonal manipulation, Mirena IUD, endometrial ablation, hysterectomy.

## 2015-11-29 NOTE — Progress Notes (Signed)
Kiannah Grunow [birth year] [id number]        40 y.o.  [name] presents for sonohysterogram. Recently saw [place] with a history of worsening menorrhagia requiring double to triple protection with bleed through episodes. Sometimes menses would last up to 2 weeks.  They do occur monthly without intermenstrual bleeding. Recent hemoglobin 14 with normal TSH. History of C-section 1 for twins at approximately 32 weeks with 2 other vaginal deliveries.  Past medical history,surgical history, problem list, medications, allergies, family history and social history were all reviewed and documented in the EPIC chart.  Directed ROS with pertinent positives and negatives documented in the history of present illness/assessment and plan.  Exam: [place] assistant Filed Vitals:   11/29/15 1132  BP: 120/76   General appearance:  Normal Abdomen soft nontender without masses guarding rebound pelvic external BUS vagina normal. Cervix normal. Uterus anteverted normal size midline mobile nontender. Adnexa without masses or tenderness.  Ultrasound shows uterus normal size and echotexture. Endometrial echo 16 mm. Right and left ovaries normal. Cul-de-sac negative.  Sonohysterogram performed, sterile technique, easy catheter introduction, good distention with no abnormalities. Endometrial sample taken. Patient tolerated well.  Assessment/Plan:  40 y.o. [id number] with worsening menorrhagia. Ultrasound/sonohysterogram normal. Reviewed options with the patient to include observation, hormonal manipulation, Mirena IUD, endometrial ablation, hysterectomy. The pros/cons, risks/benefits of each choice reviewed. Is status post BTL with no future childbearing desires. 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 Patient will think of her options and she will follow up with the biopsy results in  several days.    Dara Lords MD, 12:01 PM 11/29/2015

## 2015-11-30 ENCOUNTER — Ambulatory Visit
Admission: RE | Admit: 2015-11-30 | Discharge: 2015-11-30 | Disposition: A | Discharge status: Home / Self Care | Payer: Managed Care, Other (non HMO) | Source: Ambulatory Visit

## 2015-11-30 DIAGNOSIS — Z1231 Encounter for screening mammogram for malignant neoplasm of breast: Secondary | ICD-10-CM

## 2015-12-05 ENCOUNTER — Ambulatory Visit: Payer: Managed Care, Other (non HMO) | Admitting: Family Medicine

## 2015-12-05 ENCOUNTER — Telehealth: Payer: Self-pay

## 2015-12-05 NOTE — Telephone Encounter (Signed)
Patient called with some general questions about surgery and recovery for hysterectomy.  I could not be specific since not stated how hysterectomy will be performed but I did provider her with ballpark estimate regarding stay and recovery.  She would like for me to check her ins benefits and send her a financial letter about benefits/surgery prepayment and I will get that out to her this week.  She knows she does not want surgery prior to May. She will consider and call me when she is ready to schedule.

## 2015-12-10 ENCOUNTER — Other Ambulatory Visit: Payer: Self-pay | Admitting: Family Medicine

## 2015-12-11 ENCOUNTER — Other Ambulatory Visit: Payer: Self-pay | Admitting: Family Medicine

## 2015-12-11 NOTE — Telephone Encounter (Signed)
Last seen 05/23/15 and filled 11/07/15 #30   Please advise     KP

## 2015-12-11 NOTE — Telephone Encounter (Signed)
Pt would like to know if she can receive a 90 day supply instead of 30. Pt says that she has to call in to often to refill. Pt would like to have confirmation when Rx is sent to pharmacy if possible .    Thanks.

## 2015-12-11 NOTE — Telephone Encounter (Signed)
We do not prescribe 90 days of Ambien.     KP

## 2015-12-12 ENCOUNTER — Telehealth: Payer: Self-pay

## 2015-12-12 NOTE — Telephone Encounter (Signed)
The Rx was faxed yesterday for 12.5 of Ambien. The patient is requesting refills on her Ambien.. Please advise    KP

## 2015-12-12 NOTE — Telephone Encounter (Signed)
Patient has decided to proceed with hysterectomy. Please send me your order sheet. Thanks.

## 2015-12-12 NOTE — Telephone Encounter (Signed)
Called pt. Explained to pt that PCP is only able to prescribe 30 days at a time. Pt would like to know if she can have refills on file instead to prevent her from having to call back so often.  I think I miss understood her question before.

## 2015-12-13 NOTE — Telephone Encounter (Signed)
Ok to put 1 refill

## 2015-12-18 ENCOUNTER — Encounter: Payer: Self-pay | Admitting: Family Medicine

## 2015-12-18 ENCOUNTER — Ambulatory Visit (INDEPENDENT_AMBULATORY_CARE_PROVIDER_SITE_OTHER): Payer: Managed Care, Other (non HMO) | Admitting: Family Medicine

## 2015-12-18 VITALS — BP 132/76 | HR 78 | Temp 98.8°F | Ht 63.0 in | Wt 127.8 lb

## 2015-12-18 DIAGNOSIS — G4700 Insomnia, unspecified: Secondary | ICD-10-CM | POA: Diagnosis not present

## 2015-12-18 DIAGNOSIS — I10 Essential (primary) hypertension: Secondary | ICD-10-CM | POA: Diagnosis not present

## 2015-12-18 MED ORDER — ZOLPIDEM TARTRATE ER 12.5 MG PO TBCR: 12.5000 mg | EXTENDED_RELEASE_TABLET | Freq: Every day | ORAL | Status: DC

## 2015-12-18 NOTE — Progress Notes (Signed)
Patient ID: Jessica Molina, female    DOB: [birth year]  Age: 40 y.o. MRN: [id number]    Subjective:  Subjective HPI Jessica Molina presents for insomnia and htn f/u.  Pt had fever and cough   Review of Systems  Constitutional: Negative for diaphoresis, appetite change, fatigue and unexpected weight change.  Eyes: Negative for pain, redness and visual disturbance.  Respiratory: Negative for cough, chest tightness, shortness of breath and wheezing.   Cardiovascular: Negative for chest pain, palpitations and leg swelling.  Endocrine: Negative for cold intolerance, heat intolerance, polydipsia, polyphagia and polyuria.  Genitourinary: Negative for dysuria, frequency and difficulty urinating.  Neurological: Negative for dizziness, light-headedness, numbness and headaches.    History Past Medical History  Diagnosis Date  . Headache   . Aneurysm (HCC)     Left carotid, repaired with no special restrictions or treatments to follow    She has past surgical history that includes Appendectomy; Renal angioplasty; ANEURYSM SURGERY (01/2015); Cesarean section; and BTL (2006).   Her family history includes Breast cancer in her maternal grandmother and mother; Cancer in her father, maternal grandfather, and paternal grandmother; Cancer (age of onset: 55) in her mother; Hypertension in her father; Melanoma in her father; Stroke in her father; Thyroid disease in her maternal grandmother and mother.She reports that she has never smoked. She has never used smokeless tobacco. She reports that she drinks alcohol. She reports that she does not use illicit drugs.  Current Outpatient Prescriptions on File Prior to Visit  Medication Sig Dispense Refill  . ALPRAZolam (XANAX) 0.25 MG tablet TAKE 1 TABLET TWICE A DAY AS NEEDED 30 tablet 0  . amLODipine (NORVASC) 10 MG tablet once daily.     . chlorthalidone (HYGROTON) 25 MG tablet Take 25 mg by mouth daily as needed.     Marland Kitchen losartan (COZAAR) 100 MG tablet Take 100  mg by mouth daily. Reported on 11/07/2015    . promethazine (PHENERGAN) 25 MG tablet Take 1 tablet by mouth once or twice a week as needed 30 tablet 0  . traMADol (ULTRAM) 50 MG tablet TAKE 1 TABLET BY MOUTH EVERY 8 HRS AS NEEDED 240 tablet 0   No current facility-administered medications on file prior to visit.     Objective:  Objective Physical Exam  Constitutional: She is oriented to person, place, and time. She appears well-developed and well-nourished.  HENT:  Head: Normocephalic and atraumatic.  Eyes: Conjunctivae and EOM are normal.  Neck: Normal range of motion. Neck supple. No JVD present. Carotid bruit is not present. No thyromegaly present.  Cardiovascular: Normal rate, regular rhythm and normal heart sounds.   No murmur heard. Pulmonary/Chest: Effort normal and breath sounds normal. No respiratory distress. She has no wheezes. She has no rales. She exhibits no tenderness.  Musculoskeletal: She exhibits no edema.  Neurological: She is alert and oriented to person, place, and time.  Psychiatric: She has a normal mood and affect.  Nursing note and vitals reviewed.  BP 132/76 mmHg  Pulse 78  Temp(Src) 98.8 F (37.1 C) (Oral)  Ht  (1.6 m)  Wt 127 lb 12.8 oz (57.97 kg)  BMI 22.64 kg/m2  SpO2 99%  LMP 11/15/2015 Wt Readings from Last 3 Encounters:  12/19/15 128 lb (58.06 kg)  12/18/15 127 lb 12.8 oz (57.97 kg)  11/07/15 136 lb (61.689 kg)     Lab Results  Component Value Date   WBC 8.9 11/07/2015   HGB 14.9 11/07/2015   HCT 45.8 11/07/2015  PLT 438* 11/07/2015   TSH 0.72 11/07/2015    Mm Screening Breast Tomo Bilateral  12/15/2015  CLINICAL DATA:  Screening. EXAM: DIGITAL SCREENING BILATERAL MAMMOGRAM WITH 3D TOMO WITH CAD COMPARISON:  Previous exam(s). ACR Breast Density Category b: There are scattered areas of fibroglandular density. FINDINGS: There are no findings suspicious for malignancy. Images were processed with CAD. IMPRESSION: No mammographic  evidence of malignancy. A result letter of this screening mammogram will be mailed directly to the patient. RECOMMENDATION: Screening mammogram in one year. (Code:SM-B-01Y) BI-RADS CATEGORY  1: Negative. Electronically Signed   By: Hulan Saashomas  Lawrence M.D.   On: 12/15/2015 08:17     Assessment & Plan:  Plan I have changed Jessica Molina's zolpidem. I am also having her maintain her amLODipine, chlorthalidone, losartan, promethazine, traMADol, and ALPRAZolam.  Meds ordered this encounter  Medications  . zolpidem (AMBIEN CR) 12.5 MG CR tablet    Sig: Take 1 tablet (12.5 mg total) by mouth at bedtime.    Dispense:  30 tablet    Refill:  2    Not to exceed 5 additional fills before 05/05/2016    Problem List Items Addressed This Visit    None    Visit Diagnoses    Insomnia    -  Primary    Relevant Medications    zolpidem (AMBIEN CR) 12.5 MG CR tablet       Follow-up: Return for annual exam, fasting.  Loreen FreudYvonne Lowne, DO

## 2015-12-18 NOTE — Progress Notes (Signed)
Pre visit review using our clinic review tool, if applicable. No additional management support is needed unless otherwise documented below in the visit note. 

## 2015-12-19 ENCOUNTER — Other Ambulatory Visit (INDEPENDENT_AMBULATORY_CARE_PROVIDER_SITE_OTHER): Payer: Managed Care, Other (non HMO)

## 2015-12-19 ENCOUNTER — Telehealth: Payer: Self-pay

## 2015-12-19 ENCOUNTER — Ambulatory Visit (INDEPENDENT_AMBULATORY_CARE_PROVIDER_SITE_OTHER): Payer: Managed Care, Other (non HMO) | Admitting: Neurology

## 2015-12-19 ENCOUNTER — Encounter: Payer: Self-pay | Admitting: Neurology

## 2015-12-19 VITALS — BP 122/84 | HR 71 | Ht 63.0 in | Wt 128.0 lb

## 2015-12-19 DIAGNOSIS — R202 Paresthesia of skin: Secondary | ICD-10-CM

## 2015-12-19 DIAGNOSIS — I7771 Dissection of carotid artery: Secondary | ICD-10-CM

## 2015-12-19 DIAGNOSIS — G43709 Chronic migraine without aura, not intractable, without status migrainosus: Secondary | ICD-10-CM | POA: Diagnosis not present

## 2015-12-19 DIAGNOSIS — G43109 Migraine with aura, not intractable, without status migrainosus: Secondary | ICD-10-CM

## 2015-12-19 LAB — VITAMIN B12: Vitamin B-12: 424 pg/mL (ref 211–911)

## 2015-12-19 MED ORDER — PROPRANOLOL HCL ER 60 MG PO CP24: 60.0000 mg | ORAL_CAPSULE | Freq: Every day | ORAL | Status: DC

## 2015-12-19 NOTE — Telephone Encounter (Signed)
Results were left on pt's voicemail, with instructions to call back with any questions or concerns in relation to results.   

## 2015-12-19 NOTE — Progress Notes (Addendum)
NEUROLOGY FOLLOW UP OFFICE NOTE  Jessica Molina [id number]  HISTORY OF PRESENT ILLNESS: Jessica Abboticole Hypes is a 40 year old right-handed female with hypertension and hypotension and recent left internal carotid artery dissection who follows up for chronic daily headaches.  UPDATE: She followed up with her vascular surgeon, Dr. Adela GlimpseBernard, in December.  CTA was unremarkable.  She was told to follow up with him as needed.  She is taking the topiramate 50mg  twice daily.  Headaches are pretty much unchanged.  They occur daily.  She limits intake of tramadol.  She reports a couple of new issues: Over the past 6 months, she has had episode of numbness and tingling involving the fingers and feet.  They occurred while in the gym and lasts all day.  Sometimes, the symptoms are so severe, she cannot keep her balance.  It has occurred about 5 times over the past 6 months.  She has had 2 episodes of visual disturbance.  She reports seeing wavy rainbows in her peripheral vision (right more than left).  Her right pupil dilates larger than baseline.  There is no associated headache.  It lasts about one hour.  HISTORY: In February, she was in the hospital for hypotension.  She has history of fluctuating blood pressure and her blood pressure was in the 50s/30s.  She had several falls.  While in the hospital, it was noted that she had asymmetric pupils.  She denied focal numbness or weakness.  MRI of the brain was unremarkable.  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  She was started on ASA.  Repeat CTA of neck from 01/03/15 showed "mild irregularity of the distal cervical left ICA without significant stenosis, as well as a pseudoaneurysm projecting posteriorly from the distal cervical segment measuring up to 3.3 x 7.3 cm".  She underwent stent and embolization of the left ICA in May by Dr. Darnell LevelJoe David Bernard.  Follow up CTA of the brain on  02/14/15 showed status post stenting of the upper cervical ICA with small residual pseudoaneurysm along the posterior wall, decreased from previous exam.  She was instructed that she could discontinue ASA in late July.  Since February, she has had chronic headaches.  They are located on top and at the crown of her head.  They are of a non-throbbing quality  There were initially severe and constant.  They have since improved.  At first office visit, they were usually 4-6/10 intensity and rarely severe.  They were rarely associated with nausea but she sometimes notes photophobia or phonophobia.  They typically last hours to one day.  They occur daily.  Prior medications included ibuprofen, Tylenol, Excedrin and Fioricet.  She does report past history of migraines when she was younger and she had headaches during her pregnancy with her twins.  PAST MEDICAL HISTORY: Past Medical History  Diagnosis Date  . Headache   . Aneurysm (HCC)     Left carotid, repaired with no special restrictions or treatments to follow    MEDICATIONS: Current Outpatient Prescriptions on File Prior to Visit  Medication Sig Dispense Refill  . ALPRAZolam (XANAX) 0.25 MG tablet TAKE 1 TABLET TWICE A DAY AS NEEDED 30 tablet 0  . amLODipine (NORVASC) 10 MG tablet once daily.     . chlorthalidone (HYGROTON) 25 MG tablet Take 25 mg by mouth daily as needed.     Marland Kitchen. losartan (COZAAR) 100 MG tablet Take 100 mg by mouth  daily. Reported on 11/07/2015    . promethazine (PHENERGAN) 25 MG tablet Take 1 tablet by mouth once or twice a week as needed 30 tablet 0  . traMADol (ULTRAM) 50 MG tablet TAKE 1 TABLET BY MOUTH EVERY 8 HRS AS NEEDED 240 tablet 0  . zolpidem (AMBIEN CR) 12.5 MG CR tablet Take 1 tablet (12.5 mg total) by mouth at bedtime. 30 tablet 2   No current facility-administered medications on file prior to visit.    ALLERGIES: Allergies  Allergen Reactions  . Lisinopril Cough  . Metoprolol Other (See Comments)    did not  feel well when taking     FAMILY HISTORY: Family History  Problem Relation Age of Onset  . Breast cancer Mother     breast   . Thyroid disease Mother   . Cancer Mother 61    breast  . Hypertension Father   . Stroke Father   . Melanoma Father     melanoma   . Cancer Father     melanoma  . Breast cancer Maternal Grandmother     breast   . Thyroid disease Maternal Grandmother   . Cancer Maternal Grandfather     unknown   . Cancer Paternal Grandmother     unknow     SOCIAL HISTORY: Social History   Social History  . Marital Status: Married    Spouse Name: N/A  . Number of Children: N/A  . Years of Education: N/A   Occupational History  . Not on file.   Social History Main Topics  . Smoking status: Never Smoker   . Smokeless tobacco: Never Used  . Alcohol Use: 0.0 oz/week    0 Standard drinks or equivalent per week     Comment: social   . Drug Use: No  . Sexual Activity:    Partners: Male   Other Topics Concern  . Not on file   Social History Narrative    REVIEW OF SYSTEMS: Constitutional: No fevers, chills, or sweats, no generalized fatigue, change in appetite Eyes: No visual changes, double vision, eye pain Ear, nose and throat: No hearing loss, ear pain, nasal congestion, sore throat Cardiovascular: No chest pain, palpitations Respiratory:  No shortness of breath at rest or with exertion, wheezes GastrointestinaI: No nausea, vomiting, diarrhea, abdominal pain, fecal incontinence Genitourinary:  No dysuria, urinary retention or frequency Musculoskeletal:  No neck pain, back pain Integumentary: No rash, pruritus, skin lesions Neurological: as above Psychiatric: No depression, insomnia, anxiety Endocrine: No palpitations, fatigue, diaphoresis, mood swings, change in appetite, change in weight, increased thirst Hematologic/Lymphatic:  No anemia, purpura, petechiae. Allergic/Immunologic: no itchy/runny eyes, nasal congestion, recent allergic reactions,  rashes  PHYSICAL EXAM: Filed Vitals:   12/19/15 0855  BP: 122/84  Pulse: 71   General: No acute distress.  Patient appears well-groomed.  normal body habitus. Head:  Normocephalic/atraumatic Eyes:  Fundoscopic exam unremarkable without vessel changes, exudates, hemorrhages or papilledema. Neck: supple, no paraspinal tenderness, full range of motion Heart:  Regular rate and rhythm Lungs:  Clear to auscultation bilaterally Back: No paraspinal tenderness Neurological Exam: alert and oriented to person, place, and time. Attention span and concentration intact, recent and remote memory intact, fund of knowledge intact.  Speech fluent and not dysarthric, language intact.  OD 3mm, OS 2.5 mm, otherwise, CN II-XII intact. Fundoscopic exam unremarkable without vessel changes, exudates, hemorrhages or papilledema.  Bulk and tone normal, muscle strength 5/5 throughout.  Sensation to light touch, temperature and vibration intact.  Deep  tendon reflexes 2+ throughout, toes downgoing.  Finger to nose and heel to shin testing intact.  Gait normal, Romberg negative.  IMPRESSION: 1.  Chronic migraines 2.  Paresthesias.  Unclear etiology as they are episodic and infrequent and only occur with exercise.  Consider side effect of topiramate 3.  Visual disturbance.  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  History of carotid artery dissection  PLAN: 1.  We will discontinue topiramate.  Instead, we will try Inderal LA  daily and see if headaches improve and if she no longer has episodes of paresthesia 2.  She will continue tramadol as needed, limited to no more than 2 days out of the week. 3.  Will check B12 level 4.  Follow up in 5 to 6 months.  She is to call in 4 weeks with update. 5.  Get notes from Dr. Adela Glimpse.  15 minutes spent face to face with patient, over 50% spent discussing management and diagnosis.  Shon Millet, DO  CC:  Loreen Freud, DO

## 2015-12-19 NOTE — Telephone Encounter (Signed)
-----   Message from Drema DallasAdam R Jaffe, DO sent at 12/19/2015 12:33 PM EDT ----- b12 is normal

## 2015-12-19 NOTE — Patient Instructions (Signed)
1.  Start propranolol ER 60mg  at bedtime.  Caution for lightheadedness as it may lower blood pressure and heart rate.  Call in 4 to 5 weeks with update on headaches 2.  Decrease topamax to 50mg  at bedtime for 7 days, then stop.  Monitor for episodes of numbness 3.  Follow up in 6 months. 4.  Check B12

## 2015-12-20 NOTE — Patient Instructions (Signed)
Hypertension Hypertension, commonly called high blood pressure, is when the force of blood pumping through your arteries is too strong. Your arteries are the blood vessels that carry blood from your heart throughout your body. A blood pressure reading consists of a higher number over a lower number, such as 110/72. The higher number (systolic) is the pressure inside your arteries when your heart pumps. The lower number (diastolic) is the pressure inside your arteries when your heart relaxes. Ideally you want your blood pressure below 120/80. Hypertension forces your heart to work harder to pump blood. Your arteries may become narrow or stiff. Having untreated or uncontrolled hypertension can cause heart attack, stroke, kidney disease, and other problems. RISK FACTORS Some risk factors for high blood pressure are controllable. Others are not.  Risk factors you cannot control include:   Race. You may be at higher risk if you are African American.  Age. Risk increases with age.  Gender. Men are at higher risk than women before age 45 years. After age 65, women are at higher risk than men. Risk factors you can control include:  Not getting enough exercise or physical activity.  Being overweight.  Getting too much fat, sugar, calories, or salt in your diet.  Drinking too much alcohol. SIGNS AND SYMPTOMS Hypertension does not usually cause signs or symptoms. Extremely high blood pressure (hypertensive crisis) may cause headache, anxiety, shortness of breath, and nosebleed. DIAGNOSIS To check if you have hypertension, your health care provider will measure your blood pressure while you are seated, with your arm held at the level of your heart. It should be measured at least twice using the same arm. Certain conditions can cause a difference in blood pressure between your right and left arms. A blood pressure reading that is higher than normal on one occasion does not mean that you need treatment. If  it is not clear whether you have high blood pressure, you may be asked to return on a different day to have your blood pressure checked again. Or, you may be asked to monitor your blood pressure at home for 1 or more weeks. TREATMENT Treating high blood pressure includes making lifestyle changes and possibly taking medicine. Living a healthy lifestyle can help lower high blood pressure. You may need to change some of your habits. Lifestyle changes may include:  Following the DASH diet. This diet is high in fruits, vegetables, and whole grains. It is low in salt, red meat, and added sugars.  Keep your sodium intake below 2,300 mg per day.  Getting at least 30-45 minutes of aerobic exercise at least 4 times per week.  Losing weight if necessary.  Not smoking.  Limiting alcoholic beverages.  Learning ways to reduce stress. Your health care provider may prescribe medicine if lifestyle changes are not enough to get your blood pressure under control, and if one of the following is true:  You are 18-59 years of age and your systolic blood pressure is above 140.  You are 60 years of age or older, and your systolic blood pressure is above 150.  Your diastolic blood pressure is above 90.  You have diabetes, and your systolic blood pressure is over 140 or your diastolic blood pressure is over 90.  You have kidney disease and your blood pressure is above 140/90.  You have heart disease and your blood pressure is above 140/90. Your personal target blood pressure may vary depending on your medical conditions, your age, and other factors. HOME CARE INSTRUCTIONS    Have your blood pressure rechecked as directed by your health care provider.   Take medicines only as directed by your health care provider. Follow the directions carefully. Blood pressure medicines must be taken as prescribed. The medicine does not work as well when you skip doses. Skipping doses also puts you at risk for  problems.  Do not smoke.   Monitor your blood pressure at home as directed by your health care provider. SEEK MEDICAL CARE IF:   You think you are having a reaction to medicines taken.  You have recurrent headaches or feel dizzy.  You have swelling in your ankles.  You have trouble with your vision. SEEK IMMEDIATE MEDICAL CARE IF:  You develop a severe headache or confusion.  You have unusual weakness, numbness, or feel faint.  You have severe chest or abdominal pain.  You vomit repeatedly.  You have trouble breathing. MAKE SURE YOU:   Understand these instructions.  Will watch your condition.  Will get help right away if you are not doing well or get worse.   This information is not intended to replace advice given to you by your health care provider. Make sure you discuss any questions you have with your health care provider.   Document Released: 09/16/2005 Document Revised: 01/31/2015 Document Reviewed: 07/09/2013 Elsevier Interactive Patient Education 2016 [employer]  

## 2015-12-20 NOTE — Assessment & Plan Note (Signed)
norvasc chlorthiadone Cozaar con't meds Stable

## 2015-12-21 ENCOUNTER — Encounter: Payer: Self-pay | Admitting: Gynecology

## 2015-12-21 ENCOUNTER — Telehealth: Payer: Self-pay | Admitting: *Deleted

## 2015-12-21 NOTE — Telephone Encounter (Signed)
Message relayed to patient. Verbalized understanding and denied questions. States she will stop propranolol, however, she does not want to start an antidepressant. States she has never done well with them in the past. Has had weight gain which she said caused greater depression. Pt aware that was essentially the last option.

## 2015-12-21 NOTE — Telephone Encounter (Signed)
I would stop the Inderal.  At this point, we are pretty much left with antidepressants.  Instead, we can try nortriptyline 10mg  at bedtime, which is an antidepressant frequently used for chronic headache.  She should call in 4 weeks with update.  I would like her to stop tramadol, due to potential side effects of tramadol with antidepressants (such as serotonin syndrome).

## 2015-12-21 NOTE — Telephone Encounter (Signed)
Patient called stating that she was seen on March 21 and was started on Propranolol.  Her BP readings this morning were 101/69 pulse 52 and 98/67 pulse 49.  She is also feeling light headed.  Please advise.

## 2015-12-28 ENCOUNTER — Other Ambulatory Visit: Payer: Self-pay | Admitting: Family Medicine

## 2016-01-08 ENCOUNTER — Telehealth: Payer: Self-pay

## 2016-01-08 NOTE — Telephone Encounter (Signed)
Patient called to cancel her LAVH, Bilat Salpingectomy.  She said she is going to be moving and cannot be down recovering from surgery.  She said she wants me to cancel it and she will call me in a few mos when she is ready to reschedule.  Surgery cancelled.

## 2016-01-29 ENCOUNTER — Institutional Professional Consult (permissible substitution): Payer: Managed Care, Other (non HMO) | Admitting: Gynecology

## 2016-02-06 ENCOUNTER — Encounter (HOSPITAL_COMMUNITY): Admission: RE | Payer: Self-pay | Source: Ambulatory Visit

## 2016-02-06 ENCOUNTER — Ambulatory Visit (HOSPITAL_COMMUNITY)
Admission: RE | Admit: 2016-02-06 | Payer: Managed Care, Other (non HMO) | Source: Ambulatory Visit | Admitting: Gynecology

## 2016-02-06 SURGERY — HYSTERECTOMY, VAGINAL, LAPAROSCOPY-ASSISTED, WITH SALPINGECTOMY
Anesthesia: General | Laterality: Bilateral

## 2016-02-06 SURGERY — Surgical Case
Anesthesia: *Unknown

## 2016-02-12 ENCOUNTER — Other Ambulatory Visit: Payer: Self-pay

## 2016-02-12 ENCOUNTER — Telehealth: Payer: Self-pay | Admitting: Neurology

## 2016-02-12 DIAGNOSIS — I7771 Dissection of carotid artery: Secondary | ICD-10-CM

## 2016-02-12 DIAGNOSIS — R51 Headache: Secondary | ICD-10-CM

## 2016-02-12 DIAGNOSIS — R519 Headache, unspecified: Secondary | ICD-10-CM

## 2016-02-12 MED ORDER — PREDNISONE 10 MG PO TABS: ORAL_TABLET | ORAL | Status: DC

## 2016-02-12 NOTE — Telephone Encounter (Signed)
Pt is having really bad headaches with rainbow vision and bad pain and would like to talk to someone please call [phone]

## 2016-02-12 NOTE — Telephone Encounter (Signed)
We can prescribe her a prednisone taper to try and break the headache, which I believe are migraines.  Prednisone 10mg  tablets.  Take 6tabs x1day, then 5tabs x1day, then 4tabs x1day, then 3tabs x1day, then 2tabs x1day, then 1tab x1day, then STOP  I would like to repeat CTA of head and neck to rule out any change or recurrence of carotid dissection.

## 2016-02-12 NOTE — Telephone Encounter (Signed)
Spoke w/ patient. She has been having off/on headaches over the last 3 days. The pain in on the top/back of her head. Pain level is 6 or 7/10. She is also experiencing some Right sided neck pain, states in the same place I had pain w/ my pseudo-aneurysm. Pt is also experiencing "rainbow color" vision. Pt has not taken any medication. Please advise.

## 2016-02-13 ENCOUNTER — Telehealth: Payer: Self-pay | Admitting: Neurology

## 2016-02-13 ENCOUNTER — Emergency Department (HOSPITAL_COMMUNITY): Payer: Managed Care, Other (non HMO)

## 2016-02-13 ENCOUNTER — Emergency Department (HOSPITAL_COMMUNITY)
Admission: EM | Admit: 2016-02-13 | Discharge: 2016-02-13 | Disposition: A | Discharge status: Home / Self Care | Payer: Managed Care, Other (non HMO) | Attending: Emergency Medicine | Admitting: Emergency Medicine

## 2016-02-13 ENCOUNTER — Encounter (HOSPITAL_COMMUNITY): Payer: Self-pay

## 2016-02-13 DIAGNOSIS — Z79899 Other long term (current) drug therapy: Secondary | ICD-10-CM | POA: Diagnosis not present

## 2016-02-13 DIAGNOSIS — Z7952 Long term (current) use of systemic steroids: Secondary | ICD-10-CM | POA: Insufficient documentation

## 2016-02-13 DIAGNOSIS — Z79891 Long term (current) use of opiate analgesic: Secondary | ICD-10-CM | POA: Diagnosis not present

## 2016-02-13 DIAGNOSIS — I773 Arterial fibromuscular dysplasia: Secondary | ICD-10-CM

## 2016-02-13 DIAGNOSIS — M542 Cervicalgia: Secondary | ICD-10-CM | POA: Diagnosis present

## 2016-02-13 LAB — I-STAT CHEM 8, ED
BUN: 8 mg/dL (ref 6–20)
Calcium, Ion: 1.25 mmol/L — ABNORMAL HIGH (ref 1.12–1.23)
Chloride: 106 mmol/L (ref 101–111)
Creatinine, Ser: 0.7 mg/dL (ref 0.44–1.00)
Glucose, Bld: 86 mg/dL (ref 65–99)
HCT: 39 % (ref 36.0–46.0)
Hemoglobin: 13.3 g/dL (ref 12.0–15.0)
Potassium: 4 mmol/L (ref 3.5–5.1)
Sodium: 142 mmol/L (ref 135–145)
TCO2: 25 mmol/L (ref 0–100)

## 2016-02-13 MED ORDER — MAGNESIUM SULFATE 2 GM/50ML IV SOLN
2.0000 g | Freq: Once | INTRAVENOUS | Status: AC
  Administered 2016-02-13: 2 g via INTRAVENOUS
  Filled 2016-02-13: qty 50

## 2016-02-13 MED ORDER — PROCHLORPERAZINE EDISYLATE 5 MG/ML IJ SOLN
10.0000 mg | Freq: Once | INTRAMUSCULAR | Status: AC
  Administered 2016-02-13: 10 mg via INTRAVENOUS
  Filled 2016-02-13: qty 2

## 2016-02-13 MED ORDER — SODIUM CHLORIDE 0.9 % IV BOLUS (SEPSIS)
1000.0000 mL | Freq: Once | INTRAVENOUS | Status: AC
  Administered 2016-02-13: 1000 mL via INTRAVENOUS

## 2016-02-13 MED ORDER — DIPHENHYDRAMINE HCL 50 MG/ML IJ SOLN
25.0000 mg | Freq: Once | INTRAMUSCULAR | Status: AC
  Administered 2016-02-13: 25 mg via INTRAVENOUS
  Filled 2016-02-13: qty 1

## 2016-02-13 MED ORDER — DEXAMETHASONE SODIUM PHOSPHATE 4 MG/ML IJ SOLN
12.0000 mg | Freq: Once | INTRAMUSCULAR | Status: AC
  Administered 2016-02-13: 12 mg via INTRAVENOUS
  Filled 2016-02-13: qty 3

## 2016-02-13 MED ORDER — IOPAMIDOL (ISOVUE-370) INJECTION 76%
100.0000 mL | Freq: Once | INTRAVENOUS | Status: AC | PRN
  Administered 2016-02-13: 100 mL via INTRAVENOUS

## 2016-02-13 MED ORDER — KETOROLAC TROMETHAMINE 30 MG/ML IJ SOLN
30.0000 mg | Freq: Once | INTRAMUSCULAR | Status: AC
  Administered 2016-02-13: 30 mg via INTRAVENOUS
  Filled 2016-02-13: qty 1

## 2016-02-13 NOTE — ED Notes (Signed)
MD at bedside. 

## 2016-02-13 NOTE — ED Notes (Signed)
Discharge instructions and follow up care reviewed with patient. Patient verbalized understanding. 

## 2016-02-13 NOTE — ED Notes (Signed)
Pt presents with c/o migraine and neck pain. Pt reports that she has a hx of neck surgery and also of migraines. Pt has been referred to her neurologist and then was sent here for possible CT of her neck. Pt reports some nausea, one episode of vomiting yesterday, no diarrhea.

## 2016-02-13 NOTE — Telephone Encounter (Signed)
CTA orders placed yesterday. Pt will call [facility] To schedule. Pt also states that pain is not going away w/ Tramadol. She took 50 mg at 6 a.m. And states that she's had no relief. Worst headache pt has said she's ever had. Pt would like to know if there is anything else she can take, or if she has to wait it out. Pt started prednisone last night.

## 2016-02-13 NOTE — ED Notes (Signed)
Patient transported to CT 

## 2016-02-13 NOTE — Telephone Encounter (Signed)
My advice is to go to the ED, given her history and the fact that she is saying these are the worst headache of her life.

## 2016-02-13 NOTE — Telephone Encounter (Signed)
Relayed message to pt. [facility] were to give her medication or something. Reiterated to pt that with her condition, it was better to err on the side of caution. Pt CTA were scheduled. [facility] Cannot see her until next Friday 02/23/16. Okay to wait that long?   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   Please advise.

## 2016-02-13 NOTE — Telephone Encounter (Signed)
Jessica Molina 09/21/1976. She called wanting to see about a follow up (CTA)? She also said she was not feeling well. She said the Tramadol was not working. Her number is [phone]. Thank you

## 2016-02-13 NOTE — Telephone Encounter (Signed)
Message relayed to patient. Verbalized understanding and denied questions. Pt stated she would see about [facility]

## 2016-02-13 NOTE — ED Provider Notes (Signed)
CSN: [id number]     Arrival date & time 02/13/16  1047 History   First MD Initiated Contact with Patient 02/13/16 1101     Chief Complaint  Patient presents with  . Migraine  . Neck Pain     (Consider location/radiation/quality/duration/timing/severity/associated sxs/prior Treatment) Patient is a 40 y.o. female presenting with headaches.  Headache Pain location:  Generalized Quality:  Dull Onset quality:  Gradual Duration:  2 days Timing:  Constant Progression:  Worsening Chronicity:  New Similar to prior headaches: yes   Relieved by:  None tried Worsened by:  Light Ineffective treatments:  None tried Associated symptoms: back pain (chronic) and neck pain   Associated symptoms: no abdominal pain, no fatigue, no fever, no near-syncope, no neck stiffness, no numbness and no syncope     Past Medical History  Diagnosis Date  . Headache   . Aneurysm (HCC)     Left carotid, repaired with no special restrictions or treatments to follow   Past Surgical History  Procedure Laterality Date  . Appendectomy    . Renal angioplasty    . Aneurysm surgery  01/2015  . Cesarean section      Twins,  BTL  . Btl  2006   Family History  Problem Relation Age of Onset  . Breast cancer Mother     breast   . Thyroid disease Mother   . Cancer Mother 60    breast  . Hypertension Father   . Stroke Father   . Melanoma Father     melanoma   . Cancer Father     melanoma  . Breast cancer Maternal Grandmother     breast   . Thyroid disease Maternal Grandmother   . Cancer Maternal Grandfather     unknown   . Cancer Paternal Grandmother     unknow    Social History  Substance Use Topics  . Smoking status: Never Smoker   . Smokeless tobacco: Never Used  . Alcohol Use: 0.0 oz/week    0 Standard drinks or equivalent per week     Comment: social    OB History    Gravida Para Term Preterm AB TAB SAB Ectopic Multiple Living   4    0   0  4     Review of Systems  Constitutional:  Negative for fever and fatigue.  Eyes: Positive for visual disturbance.  Cardiovascular: Negative for syncope and near-syncope.  Gastrointestinal: Negative for abdominal pain.  Genitourinary: Negative for dysuria.  Musculoskeletal: Positive for back pain (chronic) and neck pain. Negative for neck stiffness.  Neurological: Positive for headaches. Negative for numbness.  All other systems reviewed and are negative.     Allergies  Lisinopril and Metoprolol  Home Medications   Prior to Admission medications   Medication Sig Start Date End Date Taking? Authorizing Provider  ALPRAZolam (XANAX) 0.25 MG tablet TAKE 1 TABLET TWICE DAILY AS NEEDED. Patient taking differently: TAKE 1 TABLET TWICE DAILY AS NEEDED FOR ANXIETY. 12/28/15  Yes Yvonne R Lowne Chase, DO  amLODipine (NORVASC) 10 MG tablet take one tablet by mouth once daily. 02/14/15  Yes Historical Provider, MD  chlorthalidone (HYGROTON) 25 MG tablet Take 25 mg by mouth daily.  04/18/15 04/17/16 Yes Historical Provider, MD  losartan (COZAAR) 100 MG tablet Take 100 mg by mouth daily. Reported on 11/07/2015   Yes Historical Provider, MD  Multiple Vitamin (MULTIVITAMIN WITH MINERALS) TABS tablet Take 1 tablet by mouth daily.   Yes Historical Provider, MD  predniSONE (DELTASONE) 10 MG tablet 10mg  tablets. Take 6tabs x1day, then 5tabs x1day, then 4tabs x1day, then 3tabs x1day, then 2tabs x1day, then 1tab x1day, then STOP 02/12/16  Yes Adam Mliss Fritz, DO  promethazine (PHENERGAN) 25 MG tablet Take 1 tablet by mouth once or twice a week as needed Patient taking differently: Take 25 mg by mouth every 4 (four) hours as needed for nausea or vomiting.  05/23/15  Yes Grayling Congress Lowne Chase, DO  traMADol (ULTRAM) 50 MG tablet TAKE 1 TABLET BY MOUTH EVERY 8 HRS AS NEEDED Patient taking differently: TAKE 1 TABLET BY MOUTH EVERY 8 HRS AS NEEDED FOR PAIN. 10/25/15  Yes Drema Dallas, DO  zolpidem (AMBIEN CR) 12.5 MG CR tablet Take 1 tablet (12.5 mg total) by mouth at  bedtime. 12/18/15  Yes Yvonne R Lowne Chase, DO   BP 135/83 mmHg  Pulse 90  Temp(Src) 97.7 F (36.5 C) (Oral)  Resp 18  SpO2 99%  LMP 02/06/2016 (Approximate) Physical Exam  Constitutional: She is oriented to person, place, and time. She appears well-developed and well-nourished.  HENT:  Head: Normocephalic and atraumatic.  Neck: Normal range of motion.  Cardiovascular: Normal rate and regular rhythm.   Pulmonary/Chest: Effort normal and breath sounds normal. No stridor. No respiratory distress.  Abdominal: Soft. She exhibits no distension. There is no tenderness.  Genitourinary: Guaiac negative stool. No vaginal discharge found.  Musculoskeletal: Normal range of motion. She exhibits tenderness (trapezius). She exhibits no edema.  Neurological: She is alert and oriented to person, place, and time. No cranial nerve deficit. Coordination normal.  No altered mental status, able to give full seemingly accurate history.  Face is symmetric, EOM's intact, pupils equal and reactive, vision intact, tongue and uvula midline without deviation Upper and Lower extremity motor 5/5, intact pain perception in distal extremities, 2+ reflexes in biceps, patella and achilles tendons. Finger to nose normal, heel to shin normal. Walks without assistance or evident ataxia.   Skin: Skin is warm and dry.  Nursing note and vitals reviewed.   ED Course  Procedures (including critical care time) Labs Review Labs Reviewed  I-STAT CHEM 8, ED - Abnormal; Notable for the following:    Calcium, Ion 1.25 (*)    All other components within normal limits    Imaging Review Ct Angio Head W/cm &/or Wo Cm  02/13/2016  CLINICAL DATA:  Migraine and neck pain.  Evaluate for aneurysm. EXAM: CT ANGIOGRAPHY HEAD AND NECK TECHNIQUE: Multidetector CT imaging of the head and neck was performed using the standard protocol during bolus administration of intravenous contrast. Multiplanar CT image reconstructions and MIPs were  obtained to evaluate the vascular anatomy. Carotid stenosis measurements (when applicable) are obtained utilizing NASCET criteria, using the distal internal carotid diameter as the denominator. CONTRAST:  100 cc Isovue 370 intravenous COMPARISON:  08/02/2015 CTA of the neck FINDINGS: CT HEAD Skull and Sinuses:Negative for fracture or destructive process. The visualized mastoids, middle ears, and imaged paranasal sinuses are clear. Visualized orbits: Negative. Brain: Unremarkable. No evidence of acute infarction, hemorrhage, hydrocephalus, or mass lesion/mass effect. CTA NECK Aortic arch: No aneurysm or dissection where seen. Three vessel branching Right carotid system: Mid and distal cervical beading and widening consistent with fibromuscular dysplasia. No change from prior to suggest aneurysm formation. Posterior wall proximal ICA contour abnormality which is stable and attributed to early noncalcified atherosclerosis. Left carotid system: Distal ICA stent without signs of compromise. No stenosis. Vertebral arteries:Dominant right vertebral artery with early origin from  the right subclavian. No subclavian artery stenosis. Smooth and patent to the dura. Skeleton: No contributory finding. Other neck: No incidental mass, adenopathy or inflammation to explain the history. Dilated right laryngeal ventricle without other signs of vocal paresis. CTA HEAD Anterior circulation: Symmetric carotid arteries. Bilateral para clinoid ICA narrowing, moderate, at atherosclerotic calcifications. Hypoplastic right A1 segment. Negative for aneurysm or major vessel occlusion. Posterior circulation: Strong right dominance. No unusual vertebrobasilar branching. No flow limiting stenosis or major branch occlusion. Negative for aneurysm. Venous sinuses: Patent Anatomic variants: None significant Delayed phase: No parenchymal enhancement or mass. IMPRESSION: 1. No acute arterial finding.  Negative for aneurysm. 2. Fibromuscular dysplasia  noted at the right cervical ICA. 3. Widely patent left cervical ICA stent. 4. Moderate range bilateral para clinoid ICA stenosis neighboring atherosclerotic calcification. Electronically Signed   By: Marnee Spring M.D.   On: 02/13/2016 13:58   Ct Angio Neck W/cm &/or Wo/cm  02/13/2016  CLINICAL DATA:  Migraine and neck pain.  Evaluate for aneurysm. EXAM: CT ANGIOGRAPHY HEAD AND NECK TECHNIQUE: Multidetector CT imaging of the head and neck was performed using the standard protocol during bolus administration of intravenous contrast. Multiplanar CT image reconstructions and MIPs were obtained to evaluate the vascular anatomy. Carotid stenosis measurements (when applicable) are obtained utilizing NASCET criteria, using the distal internal carotid diameter as the denominator. CONTRAST:  100 cc Isovue 370 intravenous COMPARISON:  08/02/2015 CTA of the neck FINDINGS: CT HEAD Skull and Sinuses:Negative for fracture or destructive process. The visualized mastoids, middle ears, and imaged paranasal sinuses are clear. Visualized orbits: Negative. Brain: Unremarkable. No evidence of acute infarction, hemorrhage, hydrocephalus, or mass lesion/mass effect. CTA NECK Aortic arch: No aneurysm or dissection where seen. Three vessel branching Right carotid system: Mid and distal cervical beading and widening consistent with fibromuscular dysplasia. No change from prior to suggest aneurysm formation. Posterior wall proximal ICA contour abnormality which is stable and attributed to early noncalcified atherosclerosis. Left carotid system: Distal ICA stent without signs of compromise. No stenosis. Vertebral arteries:Dominant right vertebral artery with early origin from the right subclavian. No subclavian artery stenosis. Smooth and patent to the dura. Skeleton: No contributory finding. Other neck: No incidental mass, adenopathy or inflammation to explain the history. Dilated right laryngeal ventricle without other signs of vocal  paresis. CTA HEAD Anterior circulation: Symmetric carotid arteries. Bilateral para clinoid ICA narrowing, moderate, at atherosclerotic calcifications. Hypoplastic right A1 segment. Negative for aneurysm or major vessel occlusion. Posterior circulation: Strong right dominance. No unusual vertebrobasilar branching. No flow limiting stenosis or major branch occlusion. Negative for aneurysm. Venous sinuses: Patent Anatomic variants: None significant Delayed phase: No parenchymal enhancement or mass. IMPRESSION: 1. No acute arterial finding.  Negative for aneurysm. 2. Fibromuscular dysplasia noted at the right cervical ICA. 3. Widely patent left cervical ICA stent. 4. Moderate range bilateral para clinoid ICA stenosis neighboring atherosclerotic calcification. Electronically Signed   By: Marnee Spring M.D.   On: 02/13/2016 13:58   I have personally reviewed and evaluated these images and lab results as part of my medical decision-making.   EKG Interpretation None      MDM   Final diagnoses:  Fibromuscular dysplasia (HCC)    Here at request of neurologist and neurosurgeon for headache similar to previous but associated with left sided neck pain as well. ttp in trapezius area after moving this weekend so possibly MSK as well. Does have some type of stent in place on left. cta done to ensure patency. Ha cocktail  provided with moderate relief in symptoms. Doubt meningitis or SAH based on exam and imaging. Will dc to fu w/ neurology and medicine (for fibromuscular dysplasia).   New Prescriptions: Discharge Medication List as of 02/13/2016  3:45 PM      I have personally and contemperaneously reviewed labs and imaging and used in my decision making as above.   A medical screening exam was performed and I feel the patient has had an appropriate workup for their chief complaint at this time and likelihood of emergent condition existing is low and thus workup can continue on an outpatient basis.. Their  vital signs are stable. They have been counseled on decision, discharge, follow up and which symptoms necessitate immediate return to the emergency department.  They verbally stated understanding and agreement with plan and discharged in stable condition.      Marily MemosJason Mitchell Epling, MD 02/14/16 [phone]

## 2016-02-13 NOTE — Telephone Encounter (Signed)
Prednisone should help break it.  If it is the worst headache of her life, she should go to the ED.

## 2016-02-15 ENCOUNTER — Other Ambulatory Visit: Payer: Self-pay | Admitting: Family Medicine

## 2016-02-23 ENCOUNTER — Other Ambulatory Visit: Payer: Managed Care, Other (non HMO)

## 2016-03-18 ENCOUNTER — Telehealth: Payer: Self-pay

## 2016-03-18 DIAGNOSIS — I720 Aneurysm of carotid artery: Secondary | ICD-10-CM

## 2016-03-18 NOTE — Telephone Encounter (Signed)
Ok to refer.

## 2016-03-18 NOTE — Telephone Encounter (Signed)
Last seen 12/18/15, please advise     KP

## 2016-03-18 NOTE — Telephone Encounter (Signed)
Ref placed.      KP 

## 2016-03-18 NOTE — Telephone Encounter (Signed)
Patient calling to get a referral for migraine headaches and pseudoaneurysm carotid artery and had recent CTA see [place] notes and it was recommended that she see a neurologist. She wants to see [facility] [phone]  Call patient at [phone] when referral is ready

## 2016-03-25 ENCOUNTER — Other Ambulatory Visit: Payer: Self-pay | Admitting: Neurology

## 2016-04-09 ENCOUNTER — Ambulatory Visit (INDEPENDENT_AMBULATORY_CARE_PROVIDER_SITE_OTHER): Payer: Managed Care, Other (non HMO) | Admitting: Neurology

## 2016-04-09 ENCOUNTER — Encounter: Payer: Self-pay | Admitting: Neurology

## 2016-04-09 VITALS — BP 114/87 | HR 78 | Ht 63.0 in | Wt 136.8 lb

## 2016-04-09 DIAGNOSIS — G43709 Chronic migraine without aura, not intractable, without status migrainosus: Secondary | ICD-10-CM | POA: Diagnosis not present

## 2016-04-09 DIAGNOSIS — IMO0002 Reserved for concepts with insufficient information to code with codable children: Secondary | ICD-10-CM | POA: Insufficient documentation

## 2016-04-09 MED ORDER — NORTRIPTYLINE HCL 10 MG PO CAPS: 20.0000 mg | ORAL_CAPSULE | Freq: Every day | ORAL | Status: DC

## 2016-04-09 MED ORDER — ONDANSETRON 4 MG PO TBDP: 4.0000 mg | ORAL_TABLET | Freq: Three times a day (TID) | ORAL | Status: DC | PRN

## 2016-04-09 MED ORDER — RIZATRIPTAN BENZOATE 5 MG PO TBDP: 5.0000 mg | ORAL_TABLET | ORAL | Status: DC | PRN

## 2016-04-09 MED ORDER — ALPRAZOLAM 0.5 MG PO TABS: 0.5000 mg | ORAL_TABLET | Freq: Every evening | ORAL | Status: DC | PRN

## 2016-04-09 NOTE — Progress Notes (Signed)
PATIENT: Jessica Molina DOB: 06/13/1976  Chief Complaint  Patient presents with  . Pseudoaneurysm of Carotid Artery    She was diagnosed with this condition in March 2016 and underwent star closure surgery in May 2016.  . Migraine    The frequency of her migraines have increased and she is now getting two per week.  She usually takes Tramadol but it has only been mildly helpful.  She has never tried triptan medications.  She has been on Topamax in the past but developed dizziness and arm numbness.  She tends to have visual disturbances and nausea with her migraines.     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  In February 2016, she developed significant blood pressure variation, with associated strokelike symptoms, slurred speech, [facility], was diagnosed with atypical left carotid artery aneurysm, had star close device in May 2016 [facility]sur[facility] , has been followed up regularly, which showed no recurrent aneurysm. She had significant elevated blood pressure since 2013, was found to have bilateral renal artery stenosis, was diagnosed with fibromuscular dysplasia, had bilateral renal artery angioplasty 3 times in the past, which only helped her blood pressure control temporarily, she continue have significant blood pressure variations, [facility]  She reported migraine headaches since 2015, sometimes preceding by visual aura, lasting for a few hours, followed by lateralized severe pounding headache with light noise sensitivity, nauseous, her headache last about one day, since February 2017, she is having headache 2-3 times each week, she has tried tramadol without significant help.  Previously she tried  Topamax, complains of numbness tingling of her hands, without helping her headache much, antidepression in the past, cause worsening mood swing,  Trigger for her migraines are weather change, stress, sleep deprivation, she work on night shift 7 PM to 7 AM 2 days a week  She is now taking frequent over counter medication, Aleve Tylenol with limited help  REVIEW OF SYSTEMS: Full 14 system review of systems performed and notable only for as above  ALLERGIES: Allergies  Allergen Reactions  . Lisinopril Cough  . Metoprolol Other (See Comments)    did not feel well when taking     HOME MEDICATIONS: Current Outpatient Prescriptions  Medication Sig Dispense Refill  . ALPRAZolam (XANAX) 0.25 MG tablet TAKE 1 TABLET TWICE DAILY AS NEEDED 30 tablet 0  . amLODipine (NORVASC) 10 MG tablet take one tablet by mouth once daily.    . chlorthalidone (HYGROTON) 25 MG tablet Take 25 mg by mouth daily.     Marland Kitchen. losartan (COZAAR) 100 MG tablet Take 100 mg by mouth daily. Reported on 11/07/2015    . Multiple Vitamin (MULTIVITAMIN WITH MINERALS) TABS tablet Take 1 tablet by mouth daily.    . promethazine (PHENERGAN) 25 MG tablet Take 1 tablet by mouth once or twice a week as needed (Patient taking differently: Take 25 mg by mouth every 4 (four) hours as needed for nausea or vomiting. ) 30 tablet 0  . traMADol (ULTRAM) 50 MG tablet TAKE 1 TABLET BY MOUTH EVERY 8 HRS AS NEEDED**INS WILL ONLY ALLOW 30 DAYS** 90 tablet 0  . zolpidem (AMBIEN CR) 12.5 MG CR tablet Take 1 tablet (12.5 mg total) by mouth at bedtime. 30  tablet 2   No current facility-administered medications for this visit.    PAST MEDICAL HISTORY: Past Medical History  Diagnosis Date  . Migraine   . Aneurysm (HCC)     Left carotid, repaired with no special restrictions or treatments to follow  . Anxiety   . Hypertension     PAST SURGICAL HISTORY: Past Surgical History  Procedure Laterality Date  . Appendectomy    . Renal angioplasty    .  Aneurysm surgery  01/2015  . Cesarean section      Twins,  BTL  . Btl  2006    FAMILY HISTORY: Family History  Problem Relation Age of Onset  . Breast cancer Mother     breast   . Thyroid disease Mother   . Cancer Mother 47    breast  . Hypertension Father   . Stroke Father   . Melanoma Father     melanoma   . Cancer Father     melanoma  . Breast cancer Maternal Grandmother     breast   . Thyroid disease Maternal Grandmother   . Cancer Maternal Grandfather     unknown   . Cancer Paternal Grandmother     unknow   . Hyperlipidemia Mother     SOCIAL HISTORY:  Social History   Social History  . Marital Status: Married    Spouse Name: N/A  . Number of Children: 4  . Years of Education: HS+   Occupational History  . Nurse Tech    Social History Main Topics  . Smoking status: Never Smoker   . Smokeless tobacco: Never Used  . Alcohol Use: 0.0 oz/week    0 Standard drinks or equivalent per week     Comment: 2-3 drinks per week  . Drug Use: No  . Sexual Activity:    Partners: Male   Other Topics Concern  . Not on file   Social History Narrative   Lives at home with husband and children.   Right-handed.   2-3 cups caffeine per week.     PHYSICAL EXAM   Filed Vitals:   04/09/16 0917  BP: 114/87  Pulse: 78  Height: 5\' 3"  (1.6 m)  Weight: 136 lb 12 oz (62.029 kg)    Not recorded      Body mass index is 24.23 kg/(m^2).  PHYSICAL EXAMNIATION:  Gen: NAD, conversant, well nourised, obese, well groomed                     Cardiovascular: Regular rate rhythm, no peripheral edema, warm, nontender. Eyes: Conjunctivae clear without exudates or hemorrhage Neck: Supple, no carotid bruise. Pulmonary: Clear to auscultation bilaterally   NEUROLOGICAL EXAM:  MENTAL STATUS: Speech:    Speech is normal; fluent and spontaneous with normal comprehension.  Cognition:     Orientation to time, place and person     Normal recent and remote memory     Normal  Attention span and concentration     Normal Language, naming, repeating,spontaneous speech     Fund of knowledge   CRANIAL NERVES: CN II: Visual fields are full to confrontation. Fundoscopic exam is normal with sharp discs and no vascular changes. Pupils are round equal and briskly reactive to light. CN III, IV, VI: extraocular movement are normal. No ptosis. CN V: Facial sensation is intact to pinprick in all 3 divisions bilaterally. Corneal responses are intact.  CN VII: Face is symmetric with normal eye closure and smile. CN VIII:  Hearing is normal to rubbing fingers CN IX, X: Palate elevates symmetrically. Phonation is normal. CN XI: Head turning and shoulder shrug are intact CN XII: Tongue is midline with normal movements and no atrophy.  MOTOR: There is no pronator drift of out-stretched arms. Muscle bulk and tone are normal. Muscle strength is normal.  REFLEXES: Reflexes are 2+ and symmetric at the biceps, triceps, knees, and ankles. Plantar responses are flexor.  SENSORY: Intact to light touch, pinprick, positional sensation and vibratory sensation are intact in fingers and toes.  COORDINATION: Rapid alternating movements and fine finger movements are intact. There is no dysmetria on finger-to-nose and heel-knee-shin.    GAIT/STANCE: Posture is normal. Gait is steady with normal steps, base, arm swing, and turning. Heel and toe walking are normal. Tandem gait is normal.  Romberg is absent.   DIAGNOSTIC DATA (LABS, IMAGING, TESTING) - I reviewed patient records, labs, notes, testing and imaging myself where available.   ASSESSMENT AND PLAN  Natayla Cadenhead is a 40 y.o. female   Chronic migraine headaches History of left carotid artery aneurysm, status post Starstent placement  MRI brain and MRA head to rule out structural relation  Start preventive medications nortriptyline 10 mg titrating to 20 mg every day  Maxalt 5 mg, Zofran as needed for abortive  treatment    Levert Feinstein, M.D. Ph.D.  [facility]   [address] [place], [place] [address] Ph: [phone] Fax: [phone]  CC:Donato Schultz, DO

## 2016-04-09 NOTE — Patient Instructions (Signed)
Magnesium oxide 400 mg twice a day Riboflavin  100 mg twice a day 

## 2016-04-24 ENCOUNTER — Telehealth: Payer: Self-pay | Admitting: Neurology

## 2016-04-24 DIAGNOSIS — G43109 Migraine with aura, not intractable, without status migrainosus: Secondary | ICD-10-CM

## 2016-04-24 MED ORDER — SUMATRIPTAN SUCCINATE 50 MG PO TABS: ORAL_TABLET | ORAL | 0 refills | Status: DC

## 2016-04-24 NOTE — Telephone Encounter (Signed)
Discussed with pt over the phone. She said Maxalt not able to control pain effectively. She stated that she tried everytime to take it at early phase of HA but not always can do so. I still emphasized that the rescue medication should be taking as early as possible of HA onset. She expressed understanding.   We will give her imitrex for a try. I have ordered to her pharmacy. She understands that she can take extra one in 2 hours if no relief, but no more than 2 a day and no more than 5 a week. She expressed appreciation.   Marvel Plan, MD PhD Stroke Neurology 04/24/2016 5:52 PM  Meds ordered this encounter  Medications  . SUMAtriptan (IMITREX) 50 MG tablet    Sig: May repeat in 2 hours if headache persists or recurs. No more than 2 a day and no more than 5 a week.    Dispense:  10 tablet    Refill:  0

## 2016-04-24 NOTE — Telephone Encounter (Signed)
Pt called in stating her headaches are more frequently and her pupil size is also changing more often. 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 She says that she is not getting enough sleep.  Please call and [phone]

## 2016-04-24 NOTE — Telephone Encounter (Signed)
Left message for a return call

## 2016-04-24 NOTE — Telephone Encounter (Signed)
Spoke to patient - she just started taking nortriptyline 20mg , qhs on 04/10/16 and is aware that it is a little premature for this medication to be working just yet.  She has pending appointments for MRI brain and MRA head on 05/08/16.  Encouraged her to develop a regular sleep schedule.  States she has tried Maxalt for migraine management, without relief.  She is requesting to try an alternate rescue medication.  She has never tried any other triptans - allergies: lisinopril, metoprolol.

## 2016-04-24 NOTE — Addendum Note (Signed)
Addended by: Marvel Plan on: 04/24/2016 05:53 PM   Modules accepted: Orders

## 2016-04-26 ENCOUNTER — Other Ambulatory Visit: Payer: Self-pay | Admitting: Family Medicine

## 2016-04-26 DIAGNOSIS — G4700 Insomnia, unspecified: Secondary | ICD-10-CM

## 2016-04-26 NOTE — Telephone Encounter (Signed)
Last seen and filled 12/18/15 #30 with 2 refills   Please advise    KP

## 2016-05-01 ENCOUNTER — Other Ambulatory Visit: Payer: Managed Care, Other (non HMO)

## 2016-05-07 ENCOUNTER — Other Ambulatory Visit: Payer: Self-pay | Admitting: Neurology

## 2016-05-08 ENCOUNTER — Ambulatory Visit (INDEPENDENT_AMBULATORY_CARE_PROVIDER_SITE_OTHER): Payer: Managed Care, Other (non HMO)

## 2016-05-08 DIAGNOSIS — G43709 Chronic migraine without aura, not intractable, without status migrainosus: Secondary | ICD-10-CM | POA: Diagnosis not present

## 2016-05-08 DIAGNOSIS — IMO0002 Reserved for concepts with insufficient information to code with codable children: Secondary | ICD-10-CM

## 2016-05-13 ENCOUNTER — Other Ambulatory Visit: Payer: Self-pay | Admitting: Family Medicine

## 2016-05-13 NOTE — Telephone Encounter (Signed)
Last filled #30 02/15/16 Last seen 12/18/15 Please Advise.

## 2016-05-22 ENCOUNTER — Ambulatory Visit (INDEPENDENT_AMBULATORY_CARE_PROVIDER_SITE_OTHER): Payer: Managed Care, Other (non HMO) | Admitting: Neurology

## 2016-05-22 ENCOUNTER — Encounter: Payer: Self-pay | Admitting: Neurology

## 2016-05-22 VITALS — BP 130/84 | HR 76 | Resp 20 | Ht 63.0 in | Wt 136.0 lb

## 2016-05-22 DIAGNOSIS — G43109 Migraine with aura, not intractable, without status migrainosus: Secondary | ICD-10-CM

## 2016-05-22 DIAGNOSIS — I7771 Dissection of carotid artery: Secondary | ICD-10-CM

## 2016-05-22 MED ORDER — ELETRIPTAN HYDROBROMIDE 40 MG PO TABS
40.0000 mg | ORAL_TABLET | ORAL | 6 refills | Status: DC | PRN
Start: 2016-05-22 — End: 2016-07-31

## 2016-05-22 MED ORDER — DICLOFENAC POTASSIUM(MIGRAINE) 50 MG PO PACK: 50.0000 mg | PACK | ORAL | 11 refills | Status: DC | PRN

## 2016-05-22 MED ORDER — NORTRIPTYLINE HCL 25 MG PO CAPS: 50.0000 mg | ORAL_CAPSULE | Freq: Every day | ORAL | 11 refills | Status: DC

## 2016-05-22 NOTE — Progress Notes (Signed)
PATIENT: Jessica Molina DOB: 08/20/1976  Chief Complaint  Patient presents with  . 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  In February 2016, she developed significant blood pressure variation, with associated strokelike symptoms, slurred speech, [facility], was diagnosed with atypical left carotid artery aneurysm, had star close device in May 2016 by neurosurgeon Dr[facility]ard [facility] [place], has been followed up regularly, which showed no recurrent aneurysm. She had significant elevated blood pressure since 2013, was found to have bilateral renal artery stenosis, was diagnosed with fibromuscular dysplasia, had bilateral renal artery angioplasty 3 times in the past, which only helped her blood pressure control temporarily, she continue have significant blood pressure variations, has been [facility].  She reported migraine headaches since 2015, sometimes preceding by visual aura, lasting for a few hours, followed by lateralized severe pounding headache with light noise sensitivity, nauseous, her headache last about one day, since February 2017, she is having headache 2-3 times each week, she has tried tramadol without significant help.  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  We have personally reviewed MRI of the brain in August 2017, nonspecific white matter small vessel disease, MRA of the brain was normal  REVIEW OF SYSTEMS: Full 14 system review of systems performed and notable only for as above  ALLERGIES: Allergies  Allergen Reactions  . Lisinopril Cough  . Metoprolol Other (See Comments)    did not feel well when taking     HOME MEDICATIONS: Current Outpatient Prescriptions  Medication Sig Dispense Refill  . ALPRAZolam (XANAX) 0.25 MG tablet TAKE 1 TABLET TWICE DAILY AS NEEDED. 30 tablet 0  . amLODipine (NORVASC) 10 MG tablet take one tablet by mouth once daily.    Marland Kitchen. losartan (COZAAR) 100 MG tablet Take 100 mg by mouth daily. Reported on 11/07/2015    . Multiple Vitamin (MULTIVITAMIN WITH MINERALS) TABS tablet Take 1 tablet by mouth daily.    . nortriptyline (PAMELOR) 10 MG capsule Take 2 capsules (20 mg total) by mouth at bedtime. 60 capsule 11  . ondansetron (ZOFRAN ODT) 4 MG disintegrating tablet Take 1 tablet (4 mg total) by mouth every 8 (eight) hours as needed for nausea or vomiting. 20 tablet 6  . promethazine (PHENERGAN) 25 MG tablet Take 1 tablet by mouth once or twice a week as needed (Patient taking differently: Take 25 mg by mouth every 4 (four) hours as needed for nausea or  vomiting. ) 30 tablet 0  . SUMAtriptan (IMITREX) 50 MG tablet May repeat in 2 hours if headache persists or recurs. No more than 2 a day and no more than 5 a week. 10 tablet 0  . zolpidem (AMBIEN CR) 12.5 MG CR tablet TAKE 1 TABLET BY MOUTH AT BEDTIME 30 tablet 2  . chlorthalidone  (HYGROTON) 25 MG tablet Take 25 mg by mouth daily.      No current facility-administered medications for this visit.     PAST MEDICAL HISTORY: Past Medical History:  Diagnosis Date  . Aneurysm (HCC)    Left carotid, repaired with no special restrictions or treatments to follow  . Anxiety   . Hypertension   . Migraine     PAST SURGICAL HISTORY: Past Surgical History:  Procedure Laterality Date  . ANEURYSM SURGERY  01/2015  . APPENDECTOMY    . BTL  2006  . CESAREAN SECTION     Twins,  BTL  . RENAL ANGIOPLASTY      FAMILY HISTORY: Family History  Problem Relation Age of Onset  . Breast cancer Mother     breast   . Thyroid disease Mother   . Cancer Mother [id number]    breast  . Hypertension Father   . Stroke Father   . Melanoma Father     melanoma   . Cancer Father     melanoma  . Breast cancer Maternal Grandmother     breast   . Thyroid disease Maternal Grandmother   . Cancer Maternal Grandfather     unknown   . Cancer Paternal Grandmother     unknow   . Hyperlipidemia Mother     SOCIAL HISTORY:  Social History   Social History  . Marital status: Married    Spouse name: N/A  . Number of children: 4  . Years of education: HS+   Occupational History  . Nurse Tech    Social History Main Topics  . Smoking status: Never Smoker  . Smokeless tobacco: Never Used  . Alcohol use 0.0 oz/week     Comment: 2-3 drinks per week  . Drug use: No  . Sexual activity: Yes    Partners: Male   Other Topics Concern  . Not on file   Social History Narrative   Lives at home with husband and children.   Right-handed.   2-3 cups caffeine per week.     PHYSICAL EXAM   Vitals:   05/22/16 0728  BP: 130/84  Pulse: 76  Resp: 20  Weight: 136 lb (61.7 kg)  Height: 5\' 3"  (1.6 m)    Not recorded      Body mass index is 24.09 kg/m.  PHYSICAL EXAMNIATION:  Gen: NAD, conversant, well nourised, obese, well groomed                     Cardiovascular: Regular rate  rhythm, no peripheral edema, warm, nontender. Eyes: Conjunctivae clear without exudates or hemorrhage Neck: Supple, no carotid bruise. Pulmonary: Clear to auscultation bilaterally   NEUROLOGICAL EXAM:  MENTAL STATUS: Speech:    Speech is normal; fluent and spontaneous with normal comprehension.  Cognition:     Orientation to time, place and person     Normal recent and remote memory     Normal Attention span and concentration     Normal Language, naming, repeating,spontaneous speech     Fund of knowledge   CRANIAL NERVES: CN II: Visual fields are full to confrontation. Fundoscopic  exam is normal with sharp discs and no vascular changes. Pupils are round equal and briskly reactive to light. CN III, IV, VI: extraocular movement are normal. No ptosis. CN V: Facial sensation is intact to pinprick in all 3 divisions bilaterally. Corneal responses are intact.  CN VII: Face is symmetric with normal eye closure and smile. CN VIII: Hearing is normal to rubbing fingers CN IX, X: Palate elevates symmetrically. Phonation is normal. CN XI: Head turning and shoulder shrug are intact CN XII: Tongue is midline with normal movements and no atrophy.  MOTOR: There is no pronator drift of out-stretched arms. Muscle bulk and tone are normal. Muscle strength is normal.  REFLEXES: Reflexes are 2+ and symmetric at the biceps, triceps, knees, and ankles. Plantar responses are flexor.  SENSORY: Intact to light touch, pinprick, positional sensation and vibratory sensation are intact in fingers and toes.  COORDINATION: Rapid alternating movements and fine finger movements are intact. There is no dysmetria on finger-to-nose and heel-knee-shin.    GAIT/STANCE: Posture is normal. Gait is steady with normal steps, base, arm swing, and turning. Heel and toe walking are normal. Tandem gait is normal.  Romberg is absent.   DIAGNOSTIC DATA (LABS, IMAGING, TESTING) - I reviewed patient records, labs, notes,  testing and imaging myself where available.   ASSESSMENT AND PLAN  Amandajo Gonder is a 40 y.o. female   Chronic migraine headaches History of left carotid artery aneurysm, status post Starstent placement  Titrating up preventive medications nortriptyline 25 mg 2 tablets every night  Relpax, Cambian, zofran as needed  Levert Feinstein, M.D. Ph.D.  [facility]   [address], [address] [place], [place] [address] Ph: [phone] Fax: [phone]  CC:Donato Schultz, DO

## 2016-05-29 ENCOUNTER — Emergency Department (HOSPITAL_COMMUNITY): Payer: Managed Care, Other (non HMO)

## 2016-05-29 ENCOUNTER — Encounter (HOSPITAL_COMMUNITY): Payer: Self-pay | Admitting: Emergency Medicine

## 2016-05-29 ENCOUNTER — Emergency Department (HOSPITAL_COMMUNITY)
Admission: EM | Admit: 2016-05-29 | Discharge: 2016-05-29 | Disposition: A | Discharge status: Home / Self Care | Payer: Managed Care, Other (non HMO) | Attending: Emergency Medicine | Admitting: Emergency Medicine

## 2016-05-29 ENCOUNTER — Other Ambulatory Visit: Payer: Self-pay

## 2016-05-29 DIAGNOSIS — R0789 Other chest pain: Secondary | ICD-10-CM | POA: Diagnosis present

## 2016-05-29 DIAGNOSIS — G43909 Migraine, unspecified, not intractable, without status migrainosus: Secondary | ICD-10-CM | POA: Diagnosis not present

## 2016-05-29 DIAGNOSIS — I10 Essential (primary) hypertension: Secondary | ICD-10-CM | POA: Insufficient documentation

## 2016-05-29 DIAGNOSIS — Z79899 Other long term (current) drug therapy: Secondary | ICD-10-CM | POA: Diagnosis not present

## 2016-05-29 LAB — CBC WITH DIFFERENTIAL/PLATELET
Basophils Absolute: 0 10*3/uL (ref 0.0–0.1)
Basophils Relative: 1 %
Eosinophils Absolute: 0 10*3/uL (ref 0.0–0.7)
Eosinophils Relative: 0 %
HCT: 43.1 % (ref 36.0–46.0)
Hemoglobin: 14.7 g/dL (ref 12.0–15.0)
Lymphocytes Relative: 36 %
Lymphs Abs: 2.6 10*3/uL (ref 0.7–4.0)
MCH: 30.3 pg (ref 26.0–34.0)
MCHC: 34.1 g/dL (ref 30.0–36.0)
MCV: 88.9 fL (ref 78.0–100.0)
Monocytes Absolute: 0.3 10*3/uL (ref 0.1–1.0)
Monocytes Relative: 4 %
Neutro Abs: 4.4 10*3/uL (ref 1.7–7.7)
Neutrophils Relative %: 59 %
Platelets: 412 10*3/uL — ABNORMAL HIGH (ref 150–400)
RBC: 4.85 MIL/uL (ref 3.87–5.11)
RDW: 12.5 % (ref 11.5–15.5)
WBC: 7.3 10*3/uL (ref 4.0–10.5)

## 2016-05-29 LAB — TROPONIN I: Troponin I: <0.03 ng/mL (ref <0.03)

## 2016-05-29 LAB — BASIC METABOLIC PANEL
Anion gap: 14 (ref 5–15)
BUN: 9 mg/dL (ref 6–20)
CO2: 23 mmol/L (ref 22–32)
Calcium: 10.2 mg/dL (ref 8.9–10.3)
Chloride: 101 mmol/L (ref 101–111)
Creatinine, Ser: 0.72 mg/dL (ref 0.44–1.00)
GFR calc Af Amer: >60 mL/min (ref >60)
GFR calc non Af Amer: >60 mL/min (ref >60)
Glucose, Bld: 125 mg/dL — ABNORMAL HIGH (ref 65–99)
Potassium: 3.6 mmol/L (ref 3.5–5.1)
Sodium: 138 mmol/L (ref 135–145)

## 2016-05-29 LAB — D-DIMER, QUANTITATIVE: D-Dimer, Quant: <0.27 ug/mL-FEU (ref 0.00–0.50)

## 2016-05-29 MED ORDER — KETOROLAC TROMETHAMINE 30 MG/ML IJ SOLN
30.0000 mg | Freq: Once | INTRAMUSCULAR | Status: AC
  Administered 2016-05-29: 30 mg via INTRAVENOUS
  Filled 2016-05-29: qty 1

## 2016-05-29 MED ORDER — MAGNESIUM SULFATE IN D5W 1-5 GM/100ML-% IV SOLN
1.0000 g | Freq: Once | INTRAVENOUS | Status: AC
  Administered 2016-05-29: 1 g via INTRAVENOUS
  Filled 2016-05-29: qty 100

## 2016-05-29 MED ORDER — SODIUM CHLORIDE 0.9 % IV BOLUS (SEPSIS)
1000.0000 mL | Freq: Once | INTRAVENOUS | Status: AC
  Administered 2016-05-29: 1000 mL via INTRAVENOUS

## 2016-05-29 MED ORDER — LOSARTAN POTASSIUM 50 MG PO TABS: 100.0000 mg | ORAL_TABLET | Freq: Every day | ORAL | Status: DC

## 2016-05-29 MED ORDER — PROCHLORPERAZINE EDISYLATE 5 MG/ML IJ SOLN
10.0000 mg | Freq: Once | INTRAMUSCULAR | Status: AC
  Administered 2016-05-29: 10 mg via INTRAVENOUS
  Filled 2016-05-29: qty 2

## 2016-05-29 MED ORDER — VALPROATE SODIUM 500 MG/5ML IV SOLN
500.0000 mg | Freq: Once | INTRAVENOUS | Status: AC
  Administered 2016-05-29: 500 mg via INTRAVENOUS
  Filled 2016-05-29: qty 5

## 2016-05-29 MED ORDER — AMLODIPINE BESYLATE 5 MG PO TABS
10.0000 mg | ORAL_TABLET | Freq: Once | ORAL | Status: AC
  Administered 2016-05-29: 10 mg via ORAL
  Filled 2016-05-29: qty 2

## 2016-05-29 MED ORDER — DIPHENHYDRAMINE HCL 50 MG/ML IJ SOLN
25.0000 mg | Freq: Once | INTRAMUSCULAR | Status: AC
  Administered 2016-05-29: 25 mg via INTRAVENOUS
  Filled 2016-05-29: qty 1

## 2016-05-29 MED ORDER — DEXAMETHASONE SODIUM PHOSPHATE 10 MG/ML IJ SOLN
10.0000 mg | Freq: Once | INTRAMUSCULAR | Status: AC
  Administered 2016-05-29: 10 mg via INTRAVENOUS
  Filled 2016-05-29: qty 1

## 2016-05-29 NOTE — ED Provider Notes (Signed)
Pt is feeling better.  She is still a little tachycardic, but ddimer is negative and o2 sat on ra 99%, so I don't think she has a PE.  CP is gone.  She wants to go home and try to sleep.  She knows she can return at any time.   Jacalyn LefevreJulie Laird Runnion, MD 05/29/16 [phone]

## 2016-05-29 NOTE — ED Provider Notes (Signed)
[facility]-EMERGENCY DEPT Provider Note   CSN: [id number] Arrival date & time: 05/29/16  0525     History   Chief Complaint Chief Complaint  Patient presents with  . Migraine  . Chest Pain    HPI Jessica Molina is a 40 y.o. female.  Patient presents to the ER for evaluation of headache and chest pain. Patient has a history of migraine headaches. Patient reports that she started to have a headache yesterday. Her headache did not respond to her rescue medication. Overnight the headache has progressively worsened. She has had nausea but no vomiting. Headache is frontal and middle of the head, consistent with previous migraines.  Patient also complaining of chest pain. She has discomfort in the left side of her chest that radiates to her left arm with some tingling in the arm. There is no associated shortness of breath.      Past Medical History:  Diagnosis Date  . Aneurysm (HCC)    Left carotid, repaired with no special restrictions or treatments to follow  . Anxiety   . Hypertension   . Migraine     Patient Active Problem List   Diagnosis Date Noted  . Chronic migraine 04/09/2016  . Chronic migraine without aura without status migrainosus, not intractable 12/19/2015  . Migraine aura without headache 12/19/2015  . HTN, goal below 130/80 05/23/2015  . 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 Cephalalgia 05/18/2015  . Chronic daily headache 05/18/2015    Past Surgical History:  Procedure Laterality Date  . ANEURYSM SURGERY  01/2015  . APPENDECTOMY    . BTL  2006  . CESAREAN SECTION     Twins,  BTL  . RENAL ANGIOPLASTY      OB History    Gravida Para Term Preterm AB Living   4       0 4   SAB TAB Ectopic Multiple Live Births       0           Home Medications    Prior to Admission medications   Medication Sig Start Date End Date Taking? Authorizing Provider  ALPRAZolam Prudy Feeler) 0.25 MG tablet TAKE 1 TABLET TWICE DAILY AS NEEDED. 05/13/16  Yes Yvonne R Lowne  Chase, DO  amLODipine (NORVASC) 10 MG tablet take one tablet by mouth once daily. 02/14/15  Yes Historical Provider, MD  Diclofenac Potassium 50 MG PACK Take 50 mg by mouth as needed. 05/22/16  Yes Levert Feinstein, MD  eletriptan (RELPAX) 40 MG tablet Take 1 tablet (40 mg total) by mouth as needed for migraine or headache. May repeat in 2 hours if headache persists or recurs. 05/22/16  Yes Levert Feinstein, MD  hydrochlorothiazide (HYDRODIURIL) 25 MG tablet Take 25 mg by mouth daily.   Yes Historical Provider, MD  losartan (COZAAR) 100 MG tablet Take 100 mg by mouth daily. Reported on 11/07/2015   Yes Historical Provider, MD  Multiple Vitamin (MULTIVITAMIN WITH MINERALS) TABS tablet Take 1 tablet by mouth daily.   Yes Historical Provider, MD  nortriptyline (PAMELOR) 25 MG capsule Take 2 capsules (50 mg total) by mouth at bedtime. 05/22/16  Yes Levert Feinstein, MD  ondansetron (ZOFRAN ODT) 4 MG disintegrating tablet Take 1 tablet (4 mg total) by mouth every 8 (eight) hours as needed for nausea or vomiting. 04/09/16  Yes Levert Feinstein, MD  promethazine (PHENERGAN) 25 MG tablet Take 1 tablet by mouth once or twice a week as needed Patient taking differently: Take 25 mg by mouth every 4 (four)  hours as needed for nausea or vomiting.  05/23/15  Yes Donato SchultzYvonne R Lowne Chase, DO  SUMAtriptan (IMITREX) 50 MG tablet May repeat in 2 hours if headache persists or recurs. No more than 2 a day and no more than 5 a week. 04/24/16  Yes Marvel PlanJindong Xu, MD  zolpidem (AMBIEN CR) 12.5 MG CR tablet TAKE 1 TABLET BY MOUTH AT BEDTIME 04/26/16  Yes Donato SchultzYvonne R Lowne Chase, DO    Family History Family History  Problem Relation Age of Onset  . Breast cancer Mother     breast   . Thyroid disease Mother   . Cancer Mother [id number]    breast  . Hyperlipidemia Mother   . Hypertension Father   . Stroke Father   . Melanoma Father     melanoma   . Cancer Father     melanoma  . Breast cancer Maternal Grandmother     breast   . Thyroid disease Maternal Grandmother     . Cancer Maternal Grandfather     unknown   . Cancer Paternal Grandmother     unknow     Social History Social History  Substance Use Topics  . Smoking status: Never Smoker  . Smokeless tobacco: Never Used  . Alcohol use 0.0 oz/week     Comment: 2-3 drinks per week     Allergies   Lisinopril and Metoprolol   Review of Systems Review of Systems  Respiratory: Negative for shortness of breath.   Cardiovascular: Positive for chest pain.  Neurological: Positive for headaches.  All other systems reviewed and are negative.    Physical Exam Updated Vital Signs BP 134/88   Pulse 107   Temp 97.9 F (36.6 C) (Oral)   Resp 20   Ht 5\' 3"  (1.6 m)   Wt 135 lb (61.2 kg)   LMP 05/21/2016 (Approximate)   SpO2 99%   BMI 23.91 kg/m   Physical Exam  Constitutional: She is oriented to person, place, and time. She appears well-developed and well-nourished. No distress.  HENT:  Head: Normocephalic and atraumatic.  Right Ear: Hearing normal.  Left Ear: Hearing normal.  Nose: Nose normal.  Mouth/Throat: Oropharynx is clear and moist and mucous membranes are normal.  Eyes: Conjunctivae and EOM are normal. Pupils are equal, round, and reactive to light.  Neck: Normal range of motion. Neck supple.  Cardiovascular: Regular rhythm, S1 normal and S2 normal.  Exam reveals no gallop and no friction rub.   No murmur heard. Pulmonary/Chest: Effort normal and breath sounds normal. No respiratory distress. She exhibits no tenderness.  Abdominal: Soft. Normal appearance and bowel sounds are normal. There is no hepatosplenomegaly. There is no tenderness. There is no rebound, no guarding, no tenderness at McBurney's point and negative Murphy's sign. No hernia.  Musculoskeletal: Normal range of motion.  Neurological: She is alert and oriented to person, place, and time. She has normal strength. No cranial nerve deficit or sensory deficit. Coordination normal. GCS eye subscore is 4. GCS verbal  subscore is 5. GCS motor subscore is 6.  Skin: Skin is warm, dry and intact. No rash noted. No cyanosis.  Psychiatric: She has a normal mood and affect. Her speech is normal and behavior is normal. Thought content normal.  Nursing note and vitals reviewed.    ED Treatments / Results  Labs (all labs ordered are listed, but only abnormal results are displayed) Labs Reviewed  CBC WITH DIFFERENTIAL/PLATELET - Abnormal; Notable for the following:  Result Value   Platelets 412 (*)    All other components within normal limits  BASIC METABOLIC PANEL - Abnormal; Notable for the following:    Glucose, Bld 125 (*)    All other components within normal limits  TROPONIN I  D-DIMER, QUANTITATIVE (NOT AT [facility]    EKG  EKG Interpretation None      ED ECG REPORT   Date: 05/29/2016  Rate: 117  Rhythm: sinus tachycardia  QRS Axis: normal  Intervals: normal  ST/T Wave abnormalities: nonspecific ST changes  Conduction Disutrbances:none  Narrative Interpretation:   Old EKG Reviewed: none available  I have personally reviewed the EKG tracing and agree with the computerized printout as noted.  Radiology Ct Head Wo Contrast  Result Date: 05/29/2016 CLINICAL DATA:  Headache at vertex since yesterday, history migraines, hypertension EXAM: CT HEAD WITHOUT CONTRAST TECHNIQUE: Contiguous axial images were obtained from the base of the skull through the vertex without intravenous contrast. Sagittal and coronal MPR images reconstructed from axial data set. COMPARISON:  02/13/2016 FINDINGS: Normal ventricular morphology. No midline shift or mass effect. Normal appearance of brain parenchyma. No intracranial hemorrhage, mass lesion, evidence of acute infarction, or extra-axial fluid collection. Bones and sinuses unremarkable. IMPRESSION: No acute intracranial abnormalities. Electronically Signed   By: Ulyses Southward M.D.   On: 05/29/2016 09:07   Dg Chest Port 1 View  Result Date: 05/29/2016 CLINICAL  DATA:  chest pain EXAM: PORTABLE CHEST 1 VIEW COMPARISON:  None. FINDINGS: A single AP portable view of the chest demonstrates no focal airspace consolidation or alveolar edema. The lungs are grossly clear. There is no large effusion or pneumothorax. Cardiac and mediastinal contours appear unremarkable. IMPRESSION: No active disease. Electronically Signed   By: Ellery Plunk M.D.   On: 05/29/2016 05:59    Procedures Procedures (including critical care time)  Medications Ordered in ED Medications  sodium chloride 0.9 % bolus 1,000 mL (0 mLs Intravenous Stopped 05/29/16 0657)  dexamethasone (DECADRON) injection 10 mg (10 mg Intravenous Given 05/29/16 0558)  prochlorperazine (COMPAZINE) injection 10 mg (10 mg Intravenous Given 05/29/16 0558)  ketorolac (TORADOL) 30 MG/ML injection 30 mg (30 mg Intravenous Given 05/29/16 0558)  diphenhydrAMINE (BENADRYL) injection 25 mg (25 mg Intravenous Given 05/29/16 0558)  amLODipine (NORVASC) tablet 10 mg (10 mg Oral Given 05/29/16 0641)  magnesium sulfate IVPB 1 g 100 mL (0 g Intravenous Stopped 05/29/16 0744)  valproate (DEPACON) 500 mg in dextrose 5 % 50 mL IVPB (0 mg Intravenous Stopped 05/29/16 0921)     Initial Impression / Assessment and Plan / ED Course  I have reviewed the triage vital signs and the nursing notes.  Pertinent labs & imaging results that were available during my care of the patient were reviewed by me and considered in my medical decision making (see chart for details).  Clinical Course    Patient presented with complaints of chest pain and headache. She has a history of migraines with chronic recurrence. She did not report unusual features with her headache today. Additionally she was complaining of left-sided chest pain with numbness of the left hand and arm. No neurologic deficits noted. EKG does not show ischemic changes. Troponin negative. Patient has minimal cardiac risk factors, specifically only hypertension.  Patient  administered migraine cocktail and only had minimal improvement. At this point patient was scheduled for CT scan to further evaluate head. She was given additional medication for migraine, including magnesium and Depakote. Case signed out to oncoming ER physician to follow-up  CT scan and progress.  Final Clinical Impressions(s) / ED Diagnoses   Final diagnoses:  Migraine without status migrainosus, not intractable, unspecified migraine type  Atypical chest pain    New Prescriptions Discharge Medication List as of 05/29/2016  9:24 AM       Gilda Crease, MD 05/29/16 [phone]

## 2016-05-29 NOTE — ED Triage Notes (Signed)
Pt. reports migraine headache with nausea onset yesterday , denies emesis , no blurred vision , pt. added left chest pain onset this morning , denies SOB , no diaphoresis .

## 2016-06-07 ENCOUNTER — Encounter: Payer: Self-pay | Admitting: Medical

## 2016-06-07 ENCOUNTER — Other Ambulatory Visit (HOSPITAL_COMMUNITY)
Admission: RE | Admit: 2016-06-07 | Discharge: 2016-06-07 | Disposition: A | Discharge status: Home / Self Care | Payer: Managed Care, Other (non HMO) | Source: Ambulatory Visit | Attending: Medical | Admitting: Medical

## 2016-06-07 ENCOUNTER — Ambulatory Visit (HOSPITAL_BASED_OUTPATIENT_CLINIC_OR_DEPARTMENT_OTHER)
Admission: RE | Admit: 2016-06-07 | Discharge: 2016-06-07 | Disposition: A | Discharge status: Home / Self Care | Payer: Managed Care, Other (non HMO) | Source: Ambulatory Visit | Attending: Medical | Admitting: Medical

## 2016-06-07 ENCOUNTER — Telehealth: Payer: Self-pay | Admitting: Family Medicine

## 2016-06-07 ENCOUNTER — Ambulatory Visit (INDEPENDENT_AMBULATORY_CARE_PROVIDER_SITE_OTHER): Payer: Managed Care, Other (non HMO) | Admitting: Medical

## 2016-06-07 VITALS — BP 140/100 | HR 105 | Temp 98.2°F | Ht 63.0 in | Wt 143.6 lb

## 2016-06-07 DIAGNOSIS — M545 Low back pain, unspecified: Secondary | ICD-10-CM

## 2016-06-07 DIAGNOSIS — Z113 Encounter for screening for infections with a predominantly sexual mode of transmission: Secondary | ICD-10-CM | POA: Insufficient documentation

## 2016-06-07 DIAGNOSIS — I700 Atherosclerosis of aorta: Secondary | ICD-10-CM | POA: Insufficient documentation

## 2016-06-07 DIAGNOSIS — R1030 Lower abdominal pain, unspecified: Secondary | ICD-10-CM | POA: Diagnosis not present

## 2016-06-07 DIAGNOSIS — K439 Ventral hernia without obstruction or gangrene: Secondary | ICD-10-CM | POA: Diagnosis not present

## 2016-06-07 DIAGNOSIS — R112 Nausea with vomiting, unspecified: Secondary | ICD-10-CM | POA: Diagnosis not present

## 2016-06-07 DIAGNOSIS — N760 Acute vaginitis: Secondary | ICD-10-CM | POA: Insufficient documentation

## 2016-06-07 DIAGNOSIS — M549 Dorsalgia, unspecified: Secondary | ICD-10-CM

## 2016-06-07 LAB — COMPREHENSIVE METABOLIC PANEL
ALT: 13 U/L (ref 0–35)
AST: 21 U/L (ref 0–37)
Albumin: 4.4 g/dL (ref 3.5–5.2)
Alkaline Phosphatase: 57 U/L (ref 39–117)
BUN: 11 mg/dL (ref 6–23)
CO2: 29 mEq/L (ref 19–32)
Calcium: 9 mg/dL (ref 8.4–10.5)
Chloride: 102 mEq/L (ref 96–112)
Creatinine, Ser: 0.72 mg/dL (ref 0.40–1.20)
GFR: 94.91 mL/min (ref >60.00)
Glucose, Bld: 86 mg/dL (ref 70–99)
Potassium: 3.9 mEq/L (ref 3.5–5.1)
Sodium: 136 mEq/L (ref 135–145)
Total Bilirubin: 0.4 mg/dL (ref 0.2–1.2)
Total Protein: 7.3 g/dL (ref 6.0–8.3)

## 2016-06-07 LAB — CBC WITH DIFFERENTIAL/PLATELET
Basophils Absolute: 0 10*3/uL (ref 0.0–0.1)
Basophils Relative: 0.5 % (ref 0.0–3.0)
Eosinophils Absolute: 0 10*3/uL (ref 0.0–0.7)
Eosinophils Relative: 0.2 % (ref 0.0–5.0)
HCT: 37.5 % (ref 36.0–46.0)
Hemoglobin: 12.7 g/dL (ref 12.0–15.0)
Lymphocytes Relative: 31.9 % (ref 12.0–46.0)
Lymphs Abs: 2.6 10*3/uL (ref 0.7–4.0)
MCHC: 33.8 g/dL (ref 30.0–36.0)
MCV: 89 fl (ref 78.0–100.0)
Monocytes Absolute: 0.4 10*3/uL (ref 0.1–1.0)
Monocytes Relative: 4.8 % (ref 3.0–12.0)
Neutro Abs: 5.1 10*3/uL (ref 1.4–7.7)
Neutrophils Relative %: 62.6 % (ref 43.0–77.0)
Platelets: 342 10*3/uL (ref 150.0–400.0)
RBC: 4.22 Mil/uL (ref 3.87–5.11)
RDW: 12.9 % (ref 11.5–15.5)
WBC: 8.1 10*3/uL (ref 4.0–10.5)

## 2016-06-07 LAB — POCT URINALYSIS DIPSTICK
Bilirubin, UA: NEGATIVE
Blood, UA: NEGATIVE
Glucose, UA: NEGATIVE
Ketones, UA: NEGATIVE
Leukocytes, UA: NEGATIVE
Nitrite, UA: NEGATIVE
Protein, UA: NEGATIVE
Spec Grav, UA: >=1.03
Urobilinogen, UA: 4
pH, UA: 6

## 2016-06-07 LAB — AMYLASE: Amylase: 14 U/L — ABNORMAL LOW (ref 27–131)

## 2016-06-07 LAB — LIPASE: Lipase: 15 U/L (ref 11.0–59.0)

## 2016-06-07 MED ORDER — HYDROCODONE-ACETAMINOPHEN 5-325 MG PO TABS: 1.0000 | ORAL_TABLET | Freq: Four times a day (QID) | ORAL | 0 refills | Status: DC | PRN

## 2016-06-07 MED ORDER — CEFTRIAXONE SODIUM 1 G IJ SOLR
1.0000 g | Freq: Once | INTRAMUSCULAR | Status: AC
  Administered 2016-06-07: 1 g via INTRAMUSCULAR

## 2016-06-07 NOTE — Telephone Encounter (Signed)
Patient Name: Jessica Molina  DOB: 01/17/1976    Initial Comment Caller states has some blood in urine, feels like she might have UTI, urg care gave her abx   Nurse Assessment  Nurse: Stefano GaulStringer, RN, Dwana CurdVera Date/Time (Eastern Time): 06/07/2016 11:20:26 AM  Confirm and document reason for call. If symptomatic, describe symptoms. You must click the next button to save text entered. ---Caller states she went to urgent care on Sunday. She is taking antibiotics for UTI. Had some blood in her urine on Sunday. Temp 99-101 yesterday. She is nauseated and vomiting. She is having right flank pain. She is sipping water.  Has the patient traveled out of the country within the last 30 days? ---Not Applicable  Does the patient have any new or worsening symptoms? ---Yes  Will a triage be completed? ---Yes  Related visit to physician within the last 2 weeks? ---Yes  Does the PT have any chronic conditions? (i.e. diabetes, asthma, etc.) ---No  Is the patient pregnant or possibly pregnant? (Ask all females between the ages of 7712-55) ---No  Is this a behavioral health or substance abuse call? ---No     Guidelines    Guideline Title Affirmed Question Affirmed Notes  Urinary Tract Infection on Antibiotic Follow-up Call - Female [1] Side (flank) or lower back pain AND [2] new onset since starting antibiotics    Final Disposition User   See Physician within 4 Hours (or PCP triage) Stefano GaulStringer, RN, Dwana CurdVera    Comments  appt scheduled for 1:15 pm on 06/07/2016 with Dr. Esperanza RichtersEdward Saguier   Referrals  REFERRED TO PCP OFFICE   Disagree/Comply: Comply

## 2016-06-07 NOTE — Patient Instructions (Addendum)
By history, level of pain, duration of pain and exam some concern for uti with kidney infection, other bacterial infection and possible kidney stone.  As we approach the weekend will get cbc, cmp, amylase, lipase, urine culture, urine ancillary studies and arrange for CT abd/pelvis stat.   Continue with zofran for nausea or vomiting.  For pain moderate to severe limited number of norco  We gave rocephin IM today. Can continue cipro. UC you went to state culture not back yet.  If symptoms change or worsen over weekend then ED evaluation.  Follow up Monday or Tuesday. If above studies negative next step could be US abd/pelvis

## 2016-06-07 NOTE — Telephone Encounter (Signed)
Pt seen in office today as scheduled.

## 2016-06-07 NOTE — Progress Notes (Addendum)
Subjective:    Patient ID: Jessica Molina, female    DOB: [birth year], 40 y.o.   MRN: [id number]  HPI  Pt in with some lower abdomen/suprapubic pain that started on Sunday. On Sunday pain also in her rt cva area. The pain in back has worsened since Sunday. Pain suprapbubic area   [facility] There number is [phone]. They did ua on patient and nothing showed. Culture was done but by Tuesday not back yet.   Pt was given cipro antibiotic pending culture. She has been on since Sunday. Pt stating not feeling any better with antiobiotic.   Pt pain level 4-5/10. Pt has been feeling nausea and vomiting. Vomted 5 times yesterday.But only dry heave today. Pt has tried pherngan and zofran but states did not helping stop her vomit.   LMP- May 22, 2016.Normal cycle and came when expected.  Pt is stating some fever, chills and sweats.   Pt states told had some blood in urine at [facility].   Review of Systems  Constitutional: Positive for chills, diaphoresis and fever. Negative for fatigue.  HENT: Negative for congestion, drooling and facial swelling.   Respiratory: Negative for cough, shortness of breath and wheezing.   Cardiovascular: Negative for chest pain and palpitations.  Gastrointestinal: Positive for abdominal pain. Negative for abdominal distention, anal bleeding, constipation, diarrhea, nausea, rectal pain and vomiting.  Genitourinary: Positive for dysuria, frequency and urgency. Negative for difficulty urinating, pelvic pain, vaginal bleeding, vaginal discharge and vaginal pain.       Faint urinary discomfort.  Musculoskeletal: Positive for back pain.  Skin: Negative for rash.  Hematological: Negative for adenopathy. Does not bruise/bleed easily.  Psychiatric/Behavioral: Negative for behavioral problems, confusion and decreased concentration.    Past Medical History:  Diagnosis Date  . Aneurysm (HCC)    Left carotid, repaired with no special  restrictions or treatments to follow  . Anxiety   . Hypertension   . Migraine      Social History   Social History  . Marital status: Married    Spouse name: N/A  . Number of children: 4  . Years of education: HS+   Occupational History  . Nurse Tech    Social History Main Topics  . Smoking status: Never Smoker  . Smokeless tobacco: Never Used  . Alcohol use 0.0 oz/week     Comment: 2-3 drinks per week  . Drug use: No  . Sexual activity: Yes    Partners: Male   Other Topics Concern  . Not on file   Social History Narrative   Lives at home with husband and children.   Right-handed.   2-3 cups caffeine per week.    Past Surgical History:  Procedure Laterality Date  . ANEURYSM SURGERY  01/2015  . APPENDECTOMY    . BTL  2006  . CESAREAN SECTION     Twins,  BTL  . RENAL ANGIOPLASTY      Family History  Problem Relation Age of Onset  . Breast cancer Mother     breast   . Thyroid disease Mother   . Cancer Mother 20    breast  . Hyperlipidemia Mother   . Hypertension Father   . Stroke Father   . Melanoma Father     melanoma   . Cancer Father     melanoma  . Breast cancer Maternal Grandmother     breast   . Thyroid disease Maternal Grandmother   .  Cancer Maternal Grandfather     unknown   . Cancer Paternal Grandmother     unknow     Allergies  Allergen Reactions  . Lisinopril Cough  . Metoprolol Other (See Comments)    did not feel well when taking     Current Outpatient Prescriptions on File Prior to Visit  Medication Sig Dispense Refill  . ALPRAZolam (XANAX) 0.25 MG tablet TAKE 1 TABLET TWICE DAILY AS NEEDED. 30 tablet 0  . amLODipine (NORVASC) 10 MG tablet take one tablet by mouth once daily.    Marland Kitchen. eletriptan (RELPAX) 40 MG tablet Take 1 tablet (40 mg total) by mouth as needed for migraine or headache. May repeat in 2 hours if headache persists or recurs. 10 tablet 6  . hydrochlorothiazide (HYDRODIURIL) 25 MG tablet Take 25 mg by mouth daily.      Marland Kitchen. losartan (COZAAR) 100 MG tablet Take 100 mg by mouth daily. Reported on 11/07/2015    . Multiple Vitamin (MULTIVITAMIN WITH MINERALS) TABS tablet Take 1 tablet by mouth daily.    . nortriptyline (PAMELOR) 25 MG capsule Take 2 capsules (50 mg total) by mouth at bedtime. 60 capsule 11  . ondansetron (ZOFRAN ODT) 4 MG disintegrating tablet Take 1 tablet (4 mg total) by mouth every 8 (eight) hours as needed for nausea or vomiting. 20 tablet 6  . promethazine (PHENERGAN) 25 MG tablet Take 1 tablet by mouth once or twice a week as needed (Patient taking differently: Take 25 mg by mouth every 4 (four) hours as needed for nausea or vomiting. ) 30 tablet 0  . zolpidem (AMBIEN CR) 12.5 MG CR tablet TAKE 1 TABLET BY MOUTH AT BEDTIME 30 tablet 2  . Diclofenac Potassium 50 MG PACK Take 50 mg by mouth as needed. (Patient not taking: Reported on 06/07/2016) 15 each 11  . SUMAtriptan (IMITREX) 50 MG tablet May repeat in 2 hours if headache persists or recurs. No more than 2 a day and no more than 5 a week. (Patient not taking: Reported on 06/07/2016) 10 tablet 0   No current facility-administered medications on file prior to visit.     BP (!) 140/100   Pulse (!) 105   Temp 98.2 F (36.8 C) (Oral)   Ht 5\' 3"  (1.6 m)   Wt 143 lb 9.6 oz (65.1 kg)   LMP 05/22/2016   SpO2 99%   BMI 25.44 kg/m       Objective:   Physical Exam  General Appearance- Not in acute distress.  HEENT Eyes- Scleraeral/Conjuntiva-bilat- Not Yellow. Mouth & Throat- Normal.  Chest and Lung Exam Auscultation: Breath sounds:-Normal. Adventitious sounds:- No Adventitious sounds.  Cardiovascular Auscultation:Rythm - Regular. Heart Sounds -Normal heart sounds.  Abdomen Inspection:-Inspection Normal.  Palpation/Perucssion: Palpation and Percussion of the abdomen reveal- faint suprapubic Tender, No Rebound tenderness, No rigidity(Guarding) and No Palpable abdominal masses.  No rt lower quadrant pain, no rtu upper quadrant pain. But  faint flank area pain.  Liver:-Normal.  Spleen:- Normal.   No direct adnexal pain.  Back- faint rt cva area tenderness.      Assessment & Plan:  By history, level of pain, duration of pain and exam some concern for uti with kidney infection, other bacterial infection and possible kidney stone.  As we approach the weekend will get cbc, cmp, amylase, lipase, urine culture, urine ancillary studies and arrange for CT abd/pelvis stat.   Continue with zofran for nausea or vomiting.  For pain moderate to severe limited number of  norco  We gave rocephin IM today. Can continue cipro. UC you went to state culture not back yet.  If symptoms change or worsen over weekend then ED evaluation.  Trayon Krantz, Ramon Dredge, PA-C   Note labs reviewed and cbc, cmp, amylase,lipase and CT looked ok. Not stone seen on CT.

## 2016-06-07 NOTE — Addendum Note (Signed)
Addended by: Marylouise StacksARTER, Ori Trejos E on: 06/07/2016 02:06 PM   Modules accepted: Orders

## 2016-06-10 ENCOUNTER — Ambulatory Visit (INDEPENDENT_AMBULATORY_CARE_PROVIDER_SITE_OTHER): Payer: Managed Care, Other (non HMO) | Admitting: Medical

## 2016-06-10 ENCOUNTER — Encounter: Payer: Self-pay | Admitting: Medical

## 2016-06-10 ENCOUNTER — Ambulatory Visit (HOSPITAL_BASED_OUTPATIENT_CLINIC_OR_DEPARTMENT_OTHER)
Admission: RE | Admit: 2016-06-10 | Discharge: 2016-06-10 | Disposition: A | Discharge status: Home / Self Care | Payer: Managed Care, Other (non HMO) | Source: Ambulatory Visit | Attending: Medical | Admitting: Medical

## 2016-06-10 ENCOUNTER — Telehealth: Payer: Self-pay | Admitting: Medical

## 2016-06-10 VITALS — BP 147/86 | HR 95 | Temp 98.6°F | Ht 63.0 in | Wt 143.0 lb

## 2016-06-10 DIAGNOSIS — R102 Pelvic and perineal pain: Secondary | ICD-10-CM | POA: Insufficient documentation

## 2016-06-10 DIAGNOSIS — R030 Elevated blood-pressure reading, without diagnosis of hypertension: Secondary | ICD-10-CM | POA: Diagnosis not present

## 2016-06-10 DIAGNOSIS — IMO0001 Reserved for inherently not codable concepts without codable children: Secondary | ICD-10-CM

## 2016-06-10 DIAGNOSIS — R1011 Right upper quadrant pain: Secondary | ICD-10-CM

## 2016-06-10 LAB — COMPREHENSIVE METABOLIC PANEL
ALT: 16 U/L (ref 0–35)
AST: 21 U/L (ref 0–37)
Albumin: 4.5 g/dL (ref 3.5–5.2)
Alkaline Phosphatase: 60 U/L (ref 39–117)
BUN: 14 mg/dL (ref 6–23)
CO2: 21 mEq/L (ref 19–32)
Calcium: 9.3 mg/dL (ref 8.4–10.5)
Chloride: 103 mEq/L (ref 96–112)
Creatinine, Ser: 0.76 mg/dL (ref 0.40–1.20)
GFR: 89.16 mL/min (ref >60.00)
Glucose, Bld: 99 mg/dL (ref 70–99)
Potassium: 3.7 mEq/L (ref 3.5–5.1)
Sodium: 132 mEq/L — ABNORMAL LOW (ref 135–145)
Total Bilirubin: 0.7 mg/dL (ref 0.2–1.2)
Total Protein: 7.7 g/dL (ref 6.0–8.3)

## 2016-06-10 LAB — CBC WITH DIFFERENTIAL/PLATELET
Basophils Absolute: 0 10*3/uL (ref 0.0–0.1)
Basophils Relative: 0.3 % (ref 0.0–3.0)
Eosinophils Absolute: 0 10*3/uL (ref 0.0–0.7)
Eosinophils Relative: 0.2 % (ref 0.0–5.0)
HCT: 43.4 % (ref 36.0–46.0)
Hemoglobin: 14.9 g/dL (ref 12.0–15.0)
Lymphocytes Relative: 20.2 % (ref 12.0–46.0)
Lymphs Abs: 1.8 10*3/uL (ref 0.7–4.0)
MCHC: 34.3 g/dL (ref 30.0–36.0)
MCV: 87.8 fl (ref 78.0–100.0)
Monocytes Absolute: 0.2 10*3/uL (ref 0.1–1.0)
Monocytes Relative: 2.1 % — ABNORMAL LOW (ref 3.0–12.0)
Neutro Abs: 6.9 10*3/uL (ref 1.4–7.7)
Neutrophils Relative %: 77.2 % — ABNORMAL HIGH (ref 43.0–77.0)
Platelets: 262 10*3/uL (ref 150.0–400.0)
RBC: 4.95 Mil/uL (ref 3.87–5.11)
RDW: 13.1 % (ref 11.5–15.5)
WBC: 9 10*3/uL (ref 4.0–10.5)

## 2016-06-10 LAB — URINE CYTOLOGY ANCILLARY ONLY
Chlamydia: NEGATIVE
Neisseria Gonorrhea: NEGATIVE
Trichomonas: NEGATIVE

## 2016-06-10 LAB — POCT URINALYSIS DIPSTICK
Bilirubin, UA: NEGATIVE
Glucose, UA: NEGATIVE
Ketones, UA: NEGATIVE
Leukocytes, UA: NEGATIVE
Nitrite, UA: NEGATIVE
Protein, UA: NEGATIVE
Spec Grav, UA: >=1.03
Urobilinogen, UA: 4
pH, UA: 5.5

## 2016-06-10 MED ORDER — KETOROLAC TROMETHAMINE 60 MG/2ML IM SOLN: 60.0000 mg | Freq: Once | INTRAMUSCULAR | Status: DC

## 2016-06-10 NOTE — Progress Notes (Signed)
Subjective:    Patient ID: Jessica Molina, female    DOB: [birth year], 40 y.o.   MRN: [id number]  HPI   Pt in states she still has pain in rt side abdomen. She still points to her ruq region with some flank pain. Pt was given has been using zofran for nausea or vomiting. I gave her limited number of norco over the weekend since kidney stone was in differential last time/visit. The CT of abd/pelvis for renal stones was negative.   Please see last note.  Pt has no appendix. Hx of appendix  removal.  Pt on review only one c-section. And appendix removed. NO other surgery.   Pt did use norco a couple of times. But pain is still present. She has level 6/10.   Pt urine ancillary studies are pending.I had asked LPN to do send out culture. Appears not done.  Pt states after eating nausea but does not increase her pain.   Pt thinks pain maybe little worse pain than last time.     Review of Systems  Constitutional: Negative for chills, fatigue and fever.  HENT: Negative for congestion.   Respiratory: Negative for cough, chest tightness, shortness of breath and wheezing.   Cardiovascular: Negative for chest pain and palpitations.  Gastrointestinal: Positive for abdominal pain and nausea. Negative for blood in stool, constipation, diarrhea, rectal pain and vomiting.       Rt side adnexal area pain.  Rt upper quadrant pain is worse pain.  Musculoskeletal: Negative for back pain.  Skin: Negative for rash.  Neurological: Negative for dizziness, syncope, speech difficulty, weakness, light-headedness and numbness.  Hematological: Negative for adenopathy. Does not bruise/bleed easily.  Psychiatric/Behavioral: Negative for behavioral problems and confusion.    Past Medical History:  Diagnosis Date  . Aneurysm (HCC)    Left carotid, repaired with no special restrictions or treatments to follow  . Anxiety   . Hypertension   . Migraine      Social History   Social History  . Marital  status: Married    Spouse name: N/A  . Number of children: 4  . Years of education: HS+   Occupational History  . Nurse Tech    Social History Main Topics  . Smoking status: Never Smoker  . Smokeless tobacco: Never Used  . Alcohol use 0.0 oz/week     Comment: 2-3 drinks per week  . Drug use: No  . Sexual activity: Yes    Partners: Male   Other Topics Concern  . Not on file   Social History Narrative   Lives at home with husband and children.   Right-handed.   2-3 cups caffeine per week.    Past Surgical History:  Procedure Laterality Date  . ANEURYSM SURGERY  01/2015  . APPENDECTOMY    . BTL  2006  . CESAREAN SECTION     Twins,  BTL  . RENAL ANGIOPLASTY      Family History  Problem Relation Age of Onset  . Breast cancer Mother     breast   . Thyroid disease Mother   . Cancer Mother 48    breast  . Hyperlipidemia Mother   . Hypertension Father   . Stroke Father   . Melanoma Father     melanoma   . Cancer Father     melanoma  . Breast cancer Maternal Grandmother     breast   . Thyroid disease Maternal Grandmother   . Cancer Maternal Grandfather  unknown   . Cancer Paternal Grandmother     unknow     Allergies  Allergen Reactions  . Lisinopril Cough  . Metoprolol Other (See Comments)    did not feel well when taking     Current Outpatient Prescriptions on File Prior to Visit  Medication Sig Dispense Refill  . ALPRAZolam (XANAX) 0.25 MG tablet TAKE 1 TABLET TWICE DAILY AS NEEDED. 30 tablet 0  . amLODipine (NORVASC) 10 MG tablet take one tablet by mouth once daily.    Marland Kitchen eletriptan (RELPAX) 40 MG tablet Take 1 tablet (40 mg total) by mouth as needed for migraine or headache. May repeat in 2 hours if headache persists or recurs. 10 tablet 6  . hydrochlorothiazide (HYDRODIURIL) 25 MG tablet Take 25 mg by mouth daily.    Marland Kitchen HYDROcodone-acetaminophen (NORCO) 5-325 MG tablet Take 1 tablet by mouth every 6 (six) hours as needed for moderate pain. 6 tablet  0  . losartan (COZAAR) 100 MG tablet Take 100 mg by mouth daily. Reported on 11/07/2015    . Multiple Vitamin (MULTIVITAMIN WITH MINERALS) TABS tablet Take 1 tablet by mouth daily.    . nortriptyline (PAMELOR) 25 MG capsule Take 2 capsules (50 mg total) by mouth at bedtime. 60 capsule 11  . ondansetron (ZOFRAN ODT) 4 MG disintegrating tablet Take 1 tablet (4 mg total) by mouth every 8 (eight) hours as needed for nausea or vomiting. 20 tablet 6  . promethazine (PHENERGAN) 25 MG tablet Take 1 tablet by mouth once or twice a week as needed (Patient taking differently: Take 25 mg by mouth every 4 (four) hours as needed for nausea or vomiting. ) 30 tablet 0  . zolpidem (AMBIEN CR) 12.5 MG CR tablet TAKE 1 TABLET BY MOUTH AT BEDTIME 30 tablet 2   No current facility-administered medications on file prior to visit.     BP (!) 147/86   Pulse 95   Temp 98.6 F (37 C) (Oral)   Ht 5\' 3"  (1.6 m)   Wt 143 lb (64.9 kg)   LMP 05/22/2016   SpO2 100%   BMI 25.33 kg/m      Objective:   Physical Exam  General Appearance- Not in acute distress.  HEENT Eyes- Scleraeral/Conjuntiva-bilat- Not Yellow. Mouth & Throat- Normal.  Chest and Lung Exam Auscultation: Breath sounds:-Normal. Adventitious sounds:- No Adventitious sounds.  Cardiovascular Auscultation:Rythm - Regular. Heart Sounds -Normal heart sounds.  Abdomen Inspection:-Inspection Normal.  Palpation/Perucssion: Palpation and Percussion of the abdomen reveal- Rt upper quadrant mild moderate Tenderness, No Rebound tenderness, No rigidity(Guarding) and No Palpable abdominal masses.  Liver:-Normal.  Spleen:- Normal.  Rt adnexal area mild tender to palpation.   Back- faint rt cva area pain.       Assessment & Plan:  For your abdomen pain rt upper quadrant and cva area will get abdomen US to see if any gall stones.   Your ct scan was negative for any kidney stones.  Will also get [place] of pelvis region to assess if it does look like  recent ruptured ovarian cyst as some indication on recent CT that you may of had ovarian cyst rupture.  Will get cbc and cmp today stat again. See if any changes.   Elevated bp today. Not severe when I checked. May be associated with moderate to severe pain.  Will giv toradol 30 mg im. See if this helps your pain.   Continue norco if needed.  Urine ancillary studies are pending. We got urine today  and did send out for culture.(as intended to do on last visit)  Follow up Wed or as needed  Note discussed with pt fact that her urine now showed some blood. If US studies negative but pain increases then will have to reconsider that maybe CT may have missed possible stone. Also I asked lpn to get culture of urine today.  Ieesha Abbasi, Ramon DredgeEdward, PA-C

## 2016-06-10 NOTE — Telephone Encounter (Signed)
Pt seen the by myself 06-07-2016 and 06-10-2016. Pt had negative work up so far for abd pain and back pain. [facility] before she saw me. On first visit with me, we contacted [facility] but culture was pending. Pt work up by myself has been negative. Neg ct for renal stone, net abdomen and pelvis us. Negative labs. I had wanted to get urine  culture on 06-07-2016 but our office did not send out. We sent one out yesterday but still pending. Pt still symptomatic so I need to know result of [facility]  Urine culture asap.  [facility]  number is [phone].  Will you please call Tuesday 12 th asap. Please let me know before tomorrow 11 am.

## 2016-06-10 NOTE — Patient Instructions (Addendum)
For your abdomen pain rt upper quadrant and cva area will get abdomen US to see if any gall stones.   Your ct scan was negative for any kidney stones.  Will also get US of pelvis region to assess if it does look like recent ruptured ovarian cyst as some indication on recent CT that you may of had ovarian cyst rupture.  Will get cbc and cmp today stat again. See if any changes.   Elevated bp today. Not severe when I checked. May be associated with moderate to severe pain.  Will giv toradol 30 mg im. See if this helps your pain.   Continue norco if needed.  Urine ancillary studies are pending. We got urine today and did send out for culture.(as intended to do on last visit)  Follow up Wed or as neededed

## 2016-06-11 ENCOUNTER — Ambulatory Visit: Payer: Managed Care, Other (non HMO) | Admitting: Medical

## 2016-06-11 ENCOUNTER — Telehealth: Payer: Self-pay | Admitting: Family Medicine

## 2016-06-11 DIAGNOSIS — R102 Pelvic and perineal pain: Secondary | ICD-10-CM

## 2016-06-11 LAB — URINE CULTURE: Organism ID, Bacteria: NO GROWTH

## 2016-06-11 NOTE — Telephone Encounter (Signed)
Urine culture results received via fax. Placed in Edward's red folder for review.

## 2016-06-11 NOTE — Telephone Encounter (Signed)
I remember asking for lpn to do urine pregnancy test. Quite sure I remember looking at results of test which I recall was negative. But reviewed in chart not placed by staff?

## 2016-06-11 NOTE — Telephone Encounter (Signed)
Please see gyn referral.

## 2016-06-11 NOTE — Telephone Encounter (Signed)
I'm not working with Ramon DredgeEdward.

## 2016-06-11 NOTE — Telephone Encounter (Signed)
Sp[facility] w/ [facility]. She is faxing over urine culture results to back fax machine.

## 2016-06-11 NOTE — Telephone Encounter (Signed)
I did talk with Dr. Abner GreenspanBlyth regarding pt presentation and lab findings. She recommends referring to gyn(maybe recent ruptured ovarian cyst) Dr. Katrinka BlazingSmith for evaluation of history ruptured. If severe pain or change in signs and symptoms then notify us. But also ED evaluation if needed.   Will see pt tomorrow but also try to expidite gyn evaluation.  Will call pt and communicate above to her.  Also it worth noting. Pt does not history of some gyn issues in past. Dr. Audie BoxFontaine was considering doing hysterctomy. But with new job she could not do this. So this was canceled.  [facility] Katrinka BlazingSmith. Whoever can see her quickest.

## 2016-06-11 NOTE — Telephone Encounter (Signed)
Caller name: Relationship to patient: Self Can be reached: [phone] Pharmacy:  Reason for call: Patient saw Ramon DredgeEdward yesterday and states that even with the medication given to her yesterday she is in more pain today. She also wants the results from her labs

## 2016-06-12 ENCOUNTER — Encounter: Payer: Self-pay | Admitting: Medical

## 2016-06-12 ENCOUNTER — Ambulatory Visit (INDEPENDENT_AMBULATORY_CARE_PROVIDER_SITE_OTHER): Payer: Managed Care, Other (non HMO) | Admitting: Women's Health

## 2016-06-12 ENCOUNTER — Telehealth: Payer: Self-pay | Admitting: *Deleted

## 2016-06-12 ENCOUNTER — Ambulatory Visit (INDEPENDENT_AMBULATORY_CARE_PROVIDER_SITE_OTHER): Payer: Managed Care, Other (non HMO) | Admitting: Medical

## 2016-06-12 ENCOUNTER — Encounter: Payer: Self-pay | Admitting: Women's Health

## 2016-06-12 VITALS — BP 152/86 | Ht 63.0 in | Wt 143.0 lb

## 2016-06-12 VITALS — BP 140/100 | HR 102 | Temp 98.9°F | Ht 63.0 in | Wt 143.0 lb

## 2016-06-12 DIAGNOSIS — R112 Nausea with vomiting, unspecified: Secondary | ICD-10-CM

## 2016-06-12 DIAGNOSIS — N898 Other specified noninflammatory disorders of vagina: Secondary | ICD-10-CM | POA: Diagnosis not present

## 2016-06-12 DIAGNOSIS — R1031 Right lower quadrant pain: Secondary | ICD-10-CM

## 2016-06-12 DIAGNOSIS — I10 Essential (primary) hypertension: Secondary | ICD-10-CM

## 2016-06-12 LAB — POCT URINALYSIS DIPSTICK
Bilirubin, UA: NEGATIVE
Blood, UA: NEGATIVE
Glucose, UA: NEGATIVE
Ketones, UA: NEGATIVE
Leukocytes, UA: NEGATIVE
Nitrite, UA: NEGATIVE
Protein, UA: NEGATIVE
Spec Grav, UA: >=1.03
Urobilinogen, UA: 4
pH, UA: 6

## 2016-06-12 LAB — WET PREP FOR TRICH, YEAST, CLUE
Clue Cells Wet Prep HPF POC: NONE SEEN
Trich, Wet Prep: NONE SEEN
WBC, Wet Prep HPF POC: NONE SEEN
Yeast Wet Prep HPF POC: NONE SEEN

## 2016-06-12 LAB — URINE CYTOLOGY ANCILLARY ONLY
Bacterial vaginitis: NEGATIVE
Candida vaginitis: NEGATIVE

## 2016-06-12 LAB — POCT URINE PREGNANCY: Preg Test, Ur: NEGATIVE

## 2016-06-12 MED ORDER — TRAMADOL HCL 50 MG PO TABS: 50.0000 mg | ORAL_TABLET | Freq: Four times a day (QID) | ORAL | 0 refills | Status: DC | PRN

## 2016-06-12 MED ORDER — HYDROCODONE-ACETAMINOPHEN 5-325 MG PO TABS: 1.0000 | ORAL_TABLET | Freq: Four times a day (QID) | ORAL | 0 refills | Status: DC | PRN

## 2016-06-12 MED ORDER — CLONIDINE HCL 0.1 MG PO TABS: 0.1000 mg | ORAL_TABLET | Freq: Two times a day (BID) | ORAL | 0 refills | Status: DC

## 2016-06-12 NOTE — Telephone Encounter (Signed)
Jessica Molina called from family practice stating pt has been c/o right lower quad pain, ovary pain, has been seen in there office due to this pain several times and no relief MD suggested pt follow up with GYN, pt will be seen as work in.

## 2016-06-12 NOTE — Progress Notes (Signed)
Subjective:    Patient ID: Jessica Molina, female    DOB: 1976-09-22, 40 y.o.   MRN: [id number]  HPI   Pt states pain level higher in rt lower quadrant/adnexal region. She states around 9/10 at times. Pain in upper abdomen region still present but not as bad. Work up thus negative. CT renal stone study. Abd/pelvic US neg. Some finding on CT suggesting fluid near cervix which may have indicated ruptured ovarian cyst. When she lays on rt side pain worse.  LMP- May 22, 2016. Pt still has some nausea.    Review of Systems  Constitutional: Negative for chills, fatigue and fever.  Respiratory: Negative for cough, chest tightness, shortness of breath and wheezing.   Cardiovascular: Negative for chest pain and palpitations.  Gastrointestinal: Positive for nausea. Negative for abdominal pain, blood in stool, constipation and vomiting.       Rt lower quadrant pain/adnexal area pain.  Musculoskeletal: Negative for back pain.  Skin: Negative for rash.  Neurological: Negative for dizziness, syncope, speech difficulty, weakness, light-headedness, numbness and headaches.  Hematological: Negative for adenopathy. Does not bruise/bleed easily.  Psychiatric/Behavioral: Negative for behavioral problems and confusion.   Past Medical History:  Diagnosis Date  . Aneurysm (HCC)    Left carotid, repaired with no special restrictions or treatments to follow  . Anxiety   . Hypertension   . Migraine      Social History   Social History  . Marital status: Married    Spouse name: N/A  . Number of children: 4  . Years of education: HS+   Occupational History  . Nurse Tech    Social History Main Topics  . Smoking status: Never Smoker  . Smokeless tobacco: Never Used  . Alcohol use 0.0 oz/week     Comment: 2-3 drinks per week  . Drug use: No  . Sexual activity: Yes    Partners: Male   Other Topics Concern  . Not on file   Social History Narrative   Lives at home with husband and  children.   Right-handed.   2-3 cups caffeine per week.    Past Surgical History:  Procedure Laterality Date  . ANEURYSM SURGERY  01/2015  . APPENDECTOMY    . BTL  2006  . CESAREAN SECTION     Twins,  BTL  . RENAL ANGIOPLASTY      Family History  Problem Relation Age of Onset  . Breast cancer Mother     breast   . Thyroid disease Mother   . Cancer Mother 6    breast  . Hyperlipidemia Mother   . Hypertension Father   . Stroke Father   . Melanoma Father     melanoma   . Cancer Father     melanoma  . Breast cancer Maternal Grandmother     breast   . Thyroid disease Maternal Grandmother   . Cancer Maternal Grandfather     unknown   . Cancer Paternal Grandmother     unknow     Allergies  Allergen Reactions  . Lisinopril Cough  . Metoprolol Other (See Comments)    did not feel well when taking     Current Outpatient Prescriptions on File Prior to Visit  Medication Sig Dispense Refill  . ALPRAZolam (XANAX) 0.25 MG tablet TAKE 1 TABLET TWICE DAILY AS NEEDED. 30 tablet 0  . amLODipine (NORVASC) 10 MG tablet take one tablet by mouth once daily.    Marland Kitchen eletriptan (RELPAX) 40 MG tablet  Take 1 tablet (40 mg total) by mouth as needed for migraine or headache. May repeat in 2 hours if headache persists or recurs. 10 tablet 6  . hydrochlorothiazide (HYDRODIURIL) 25 MG tablet Take 25 mg by mouth daily.    Marland Kitchen. losartan (COZAAR) 100 MG tablet Take 100 mg by mouth daily. Reported on 11/07/2015    . Multiple Vitamin (MULTIVITAMIN WITH MINERALS) TABS tablet Take 1 tablet by mouth daily.    . nortriptyline (PAMELOR) 25 MG capsule Take 2 capsules (50 mg total) by mouth at bedtime. 60 capsule 11  . ondansetron (ZOFRAN ODT) 4 MG disintegrating tablet Take 1 tablet (4 mg total) by mouth every 8 (eight) hours as needed for nausea or vomiting. 20 tablet 6  . promethazine (PHENERGAN) 25 MG tablet Take 1 tablet by mouth once or twice a week as needed (Patient taking differently: Take 25 mg by mouth  every 4 (four) hours as needed for nausea or vomiting. ) 30 tablet 0  . zolpidem (AMBIEN CR) 12.5 MG CR tablet TAKE 1 TABLET BY MOUTH AT BEDTIME 30 tablet 2  . HYDROcodone-acetaminophen (NORCO) 5-325 MG tablet Take 1 tablet by mouth every 6 (six) hours as needed for moderate pain. (Patient not taking: Reported on 06/12/2016) 6 tablet 0   Current Facility-Administered Medications on File Prior to Visit  Medication Dose Route Frequency Provider Last Rate Last Dose  . ketorolac (TORADOL) injection 60 mg  60 mg Intramuscular Once [employer]Edward Eames Dibiasio, PA-C        BP (!) 139/106   Pulse (!) 102   Temp 98.9 F (37.2 C) (Oral)   Ht 5\' 3"  (1.6 m)   Wt 143 lb (64.9 kg)   LMP 05/22/2016   SpO2 100%   BMI 25.33 kg/m       Objective:   Physical Exam   General Appearance- Not in acute distress.  HEENT Eyes- Scleraeral/Conjuntiva-bilat- Not Yellow. Mouth & Throat- Normal.  Chest and Lung Exam Auscultation: Breath sounds:-Normal. Adventitious sounds:- No Adventitious sounds.  Cardiovascular Auscultation:Rythm - Regular. Heart Sounds -Normal heart sounds.  Abdomen Inspection:-Inspection Normal.  Palpation/Perucssion: Palpation and Percussion of the abdomen reveal- faint ruqTender and moderate rt lower quadrant/adnexal area pain, No Rebound tenderness, No rigidity(Guarding) and No Palpable abdominal masses.  Liver:-Normal.  Spleen:- Normal.    Back- faint rt cva tenderness.  . Neurologic Cranial Nerve exam:- CN III-XII intact(No nystagmus), symmetric smile. Strength:- 5/5 equal and symmetric strength both upper and lower extremities.      Assessment & Plan:  For your rt lower quadrant region pain/ovary region pain we have put in referral to both Dr. Audie BoxFontaine gyn and our gyn on our floor. We are awaiting there response. Goal is to try to get you in this week. I am prescribing more pain medication during the interim.  Your urine today did not show blood. So this makes dx of kidney  stone remote as prior ct renal stone was negative as well.  For nausea use zofran tablets.  If pain increases despite above measures or changes location prior to referral then ED evaluation.  Your bp is still high. In fact little higher and you are on 3 med high dose range.  Recent uti cleared after cipro per culture results that I got back yesterday.  Follow up 7 days or as needed

## 2016-06-12 NOTE — Patient Instructions (Signed)
Laparoscopically Assisted Vaginal Hysterectomy A laparoscopically assisted vaginal hysterectomy (LAVH) is a surgical procedure to remove the uterus and cervix, and sometimes the ovaries and fallopian tubes. During an LAVH, some of the surgical removal is done through the vagina, and the rest is done through a few small surgical cuts (incisions) in the abdomen.  This procedure is usually considered in women when a vaginal hysterectomy is not an option. Your health care provider will discuss the risks and benefits of the different surgical techniques at your appointment. Generally, recovery time is faster and there are fewer complications after laparoscopic procedures than after open incisional procedures. LET YOUR HEALTH CARE PROVIDER KNOW ABOUT:   Any allergies you have.  All medicines you are taking, including vitamins, herbs, eye drops, creams, and over-the-counter medicines.  Previous problems you or members of your family have had with the use of anesthetics.  Any blood disorders you have.  Previous surgeries you have had.  Medical conditions you have. RISKS AND COMPLICATIONS Generally, this is a safe procedure. However, as with any procedure, complications can occur. Possible complications include:  Allergies to medicines.  Difficulty breathing.  Bleeding.  Infection.  Damage to other structures near your uterus and cervix. BEFORE THE PROCEDURE  Ask your health care provider about changing or stopping your regular medicines.  Take certain medicines, such as a colon-emptying preparation, as directed.  Do not eat or drink anything for at least 8 hours before your surgery.  Stop smoking if you smoke. Stopping will improve your health after surgery.  Arrange for a ride home after surgery and for help at home during recovery. PROCEDURE   An IV tube will be put into one of your veins in order to give you fluids and medicines.  You will receive medicines to relax you and  medicines that make you sleep (general anesthetic).  You may have a flexible tube (catheter) put into your bladder to drain urine.  You may have a tube put through your nose or mouth that goes into your stomach (nasogastric tube). The nasogastric tube removes digestive fluids and prevents you from feeling nauseated and from vomiting.  Tight-fitting (compression) stockings will be placed on your legs to promote circulation.  Three to four small incisions will be made in your abdomen. An incision also will be made in your vagina. Probes and tools will be inserted into the small incisions. The uterus and cervix are removed (and possibly your ovaries and fallopian tubes) through your vagina as well as through the small incisions that were made in the abdomen.  Your vagina is then sewn back to normal. AFTER THE PROCEDURE  You may have a liquid diet temporarily. You will most likely return to, and tolerate, your usual diet the day after surgery.  You will be passing urine through a catheter. It will be removed the day after surgery.  Your temperature, breathing rate, heart rate, blood pressure, and oxygen level will be monitored regularly.  You will still wear compression stockings on your legs until you are able to move around.  You will use a special device or do breathing exercises to keep your lungs clear.  You will be encouraged to walk as soon as possible.   This information is not intended to replace advice given to you by your health care provider. Make sure you discuss any questions you have with your health care provider.   Document Released: 09/05/2011 Document Revised: 10/07/2014 Document Reviewed: 04/01/2013 Elsevier Interactive Patient Education 2016 Elsevier   Inc. Endometriosis Endometriosis is a condition in which the tissue that lines the uterus (endometrium) grows outside of its normal location. The tissue may grow in many locations close to the uterus, but it commonly  grows on the ovaries, fallopian tubes, vagina, or bowel. Because the uterus expels, or sheds, its lining every menstrual cycle, there is bleeding wherever the endometrial tissue is located. This can cause pain because blood is irritating to tissues not normally exposed to it.  CAUSES  The cause of endometriosis is not known.  SIGNS AND SYMPTOMS  Often, there are no symptoms. When symptoms are present, they can vary with the location of the displaced tissue. Various symptoms can occur at different times. Although symptoms occur mainly during a woman's menstrual period, they can also occur midcycle and usually stop with menopause. Some people may go months with no symptoms at all. Symptoms may include:   Back or abdominal pain.   Heavier bleeding during periods.   Pain during intercourse.   Painful bowel movements.   Infertility. DIAGNOSIS  Your health care provider will do a physical exam and ask about your symptoms. Various tests may be done, such as:   Blood tests and urine tests. These are done to help rule out other problems.   Ultrasound. This test is done to look for abnormal tissue.   An X-ray of the lower bowel (barium enema).  Laparoscopy. In this procedure, a thin, lighted tube with a tiny camera on the end (laparoscope) is inserted into your abdomen. This helps your health care provider look for abnormal tissue to confirm the diagnosis. The health care provider may also remove a small piece of tissue (biopsy) from any abnormal tissue found. This tissue sample can then be sent to a lab so it can be looked at under a microscope. TREATMENT  Treatment will vary and may include:   Medicines to relieve pain. Nonsteroidal anti-inflammatory drugs (NSAIDs) are a type of pain medicine that can help to relieve the pain caused by endometriosis.  Hormonal therapy. When using hormonal therapy, periods are eliminated. This eliminates the monthly exposure to blood by the displaced  endometrial tissue.   Surgery. Surgery may sometimes be done to remove the abnormal endometrial tissue. In severe cases, surgery may be done to remove the fallopian tubes, uterus, and ovaries (hysterectomy). HOME CARE INSTRUCTIONS   Take all medicines as directed by your health care provider. Do not take aspirin because it may increase bleeding when you are not on hormonal therapy.   Avoid activities that produce pain, including sexual activity. SEEK MEDICAL CARE IF:  You have pelvic pain before, after, or during your periods.  You have pelvic pain between periods that gets worse during your period.  You have pelvic pain during or after sex.  You have pelvic pain with bowel movements or urination, especially during your period.  You have problems getting pregnant.  You have a fever. SEEK IMMEDIATE MEDICAL CARE IF:   Your pain is severe and is not responding to pain medicine.   You have severe nausea and vomiting, or you cannot keep foods down.   You have pain that is limited to the right lower part of your abdomen.   You have swelling or increasing pain in your abdomen.   You see blood in your stool.  MAKE SURE YOU:   Understand these instructions.  Will watch your condition.  Will get help right away if you are not doing well or get worse.  This information is not intended to replace advice given to you by your health care provider. Make sure you discuss any questions you have with your health care provider.   Document Released: 09/13/2000 Document Revised: 10/07/2014 Document Reviewed: 05/14/2013 Elsevier Interactive Patient Education [employer]2016 Elsevier Inc.

## 2016-06-12 NOTE — Telephone Encounter (Signed)
Pt seen in office today and also seen by GYN today.

## 2016-06-12 NOTE — Addendum Note (Signed)
Addended by: Kem ParkinsonBARNES, Kirrah Mustin on: 06/12/2016 02:09 PM   Modules accepted: Orders

## 2016-06-12 NOTE — Progress Notes (Signed)
Presents with right lower abdominal pain that has increased in intensity over the past week. LMP 05/22/2016 rated pain at 5-6, today rates at 8. Cycles are monthly with menorrhagia and last between 7 and 14 days. BTL.  Had a negative sonohysterogram 11/2015 Has been seen at urgent care on 9/8 and 9/11//2017, had a normal pelvic ultrasound 06/10/2016, negative abdominal CT. History of an appendectomy in the past. Was planning to proceed with hysterectomy in March but since had just started a new job postponed. States pain is debilitating, has missed work. Denies vaginal discharge, urinary symptoms or fever, has had some nausea without vomiting. Had no relief of pain with Tylenol, Motrin or hydrocodone. History of migraines .  Exam: Appears uncomfortable. Abdomen soft without rebound but uncomfortable with deep palpation. External genitalia within normal limits, speculum exam no visible discharge.  Chronic low abdominal pain Menorrhagia and dysmenorrhea  Plan: UlTRAM 50 mg every 6-8 hours when necessary #20 with no refills. Reviewed possibly perceiving with hysterectomy. Instructed to schedule appointment with Dr. Audie BoxFontaine to discuss further. Options of OCs and Mirena IUD discussed, declines.

## 2016-06-12 NOTE — Patient Instructions (Addendum)
For your rt lower quadrant region pain/ovary region pain we have put in referral to both Dr. Audie BoxFontaine gyn and our gyn on our floor. We are awaiting there response. Goal is to try to get you in this week. I am prescribing more pain medication during the interim.  Your urine today did not show blood. So this makes dx of kidney stone remote as prior ct renal stone was negative as well.  For nausea will use zofran tablets.  If pain increases despite above measures or changes location prior to referral then ED evaluation.  Your bp is still high. In fact little higher and you are on 3 med high dose range. Will rx clonidine. Wanted to rx toprol but reaction to b-blocker in past.  Recent uti cleared after cipro per culture results that I got back yesterday.  Follow up 7 days or as needed

## 2016-06-13 ENCOUNTER — Telehealth: Payer: Self-pay

## 2016-06-13 NOTE — Telephone Encounter (Addendum)
Patient saw [place] yesterday. She called because she wants to schedule LAVH, Bilateral Salpingectomy ASAP as she is in pain.(She was scheduled for this back in May but cancelled because she was moving and did not have time for recovery.)  She said she "can't work, can't function".  She said [place] recommend she see you first but she wants to go ahead and schedule ASAP. I have block time open on the 19th and 26th if you are okay with me scheduling her and fitting her in for pre op appt with you.

## 2016-06-13 NOTE — Telephone Encounter (Signed)
Okay to schedule LAVH, bilateral salpingectomies. The 26 would be good

## 2016-06-13 NOTE — Telephone Encounter (Signed)
Note that I stated incorrectly. Block time for the 19th Sept had been released last week and OR schedule is now unavailable.

## 2016-06-13 NOTE — Telephone Encounter (Signed)
Spoke with patient. [facility]  Debarah CrapeClaudia will call her to schedule pre op appt.

## 2016-06-14 ENCOUNTER — Encounter: Payer: Self-pay | Admitting: Gynecology

## 2016-06-17 ENCOUNTER — Encounter: Payer: Self-pay | Admitting: Gynecology

## 2016-06-17 ENCOUNTER — Encounter: Payer: Self-pay | Admitting: Neurology

## 2016-06-17 ENCOUNTER — Ambulatory Visit (INDEPENDENT_AMBULATORY_CARE_PROVIDER_SITE_OTHER): Payer: Managed Care, Other (non HMO) | Admitting: Gynecology

## 2016-06-17 VITALS — BP 112/66

## 2016-06-17 DIAGNOSIS — G8929 Other chronic pain: Secondary | ICD-10-CM

## 2016-06-17 DIAGNOSIS — R1031 Right lower quadrant pain: Secondary | ICD-10-CM

## 2016-06-17 DIAGNOSIS — N920 Excessive and frequent menstruation with regular cycle: Secondary | ICD-10-CM

## 2016-06-17 DIAGNOSIS — N946 Dysmenorrhea, unspecified: Secondary | ICD-10-CM | POA: Diagnosis not present

## 2016-06-17 NOTE — H&P (Signed)
Jessica Molina November 14, 1975 [id number]   History and Physical  Chief complaint: Menorrhagia, dysmenorrhea, right abdominal pain.  History of present illness: 40 y.o. [id number] with history of worsening menorrhagia and dysmenorrhea requiring triple protection with bleedthrough episodes and significant discomfort. Was to proceed with hysterectomy earlier this year but due to work constraints she postponed this. Had a negative sonohysterogram with negative endometrial biopsy. Most recently her menses have continued to worsen with significant discomfort and over the last several weeks has been having right-sided pain disabling when it occurs. Also associated nausea with some vomiting. Has been evaluated with a negative ultrasound and negative abdominal CT. Is status post appendectomy in the past. Does feel that the pain overall is getting better and wants to proceed with hysterectomy now for menorrhagia dysmenorrhea relief. Most recent hemoglobin was 14.  Endometrial biopsy 11/2015 showed proliferative endometrium and Pap smear/HPV 11/2015 was negative  Past medical history,surgical history, medications, allergies, family history and social history were all reviewed and documented in the EPIC chart.  ROS:  Was performed and pertinent positives and negatives are included in the history of present illness.  Exam:  Jessica Molina assistant  06/17/2016 Vitals:   06/17/16 1407  BP: 112/66   General: well developed, well nourished female, no acute distress HEENT: normal  Lungs: clear to auscultation without wheezing, rales or rhonchi  Cardiac: regular rate without rubs, murmurs or gallops  Abdomen: soft, nontender without masses, guarding, rebound, organomegaly  Pelvic: external bus vagina: normal   Cervix: grossly normal  Uterus: normal size, midline and mobile, nontender  Adnexa: without masses or tenderness    Assessment/Plan:  40 y.o. [id number] with history as above. Exam overall was normal without  abdominal pain or pelvic pain. Patient wants to proceed with hysterectomy for menstrual and dysmenorrhea control. Is status post BTL in the past. Also had C-section with twin delivery with 2 vaginal deliveries. Will plan on LAVH to allow for pelvic assessment given her other pain and to evaluate the bladder given her C-section history. I also reviewed with her prophylactic bilateral salpingectomies as a risk reductive surgery for "ovarian" cancer and she also wants to proceed with this but understands that if it would add significant risk to the procedure that I may not remove her fallopian tubes. We do plan on leaving her ovaries for continued hormonal production at the age of 47. She does understand the risk of ovarian conservation to include ovarian disease requiring surgery in the future as well as ovarian cancer. Also if significant pathology is encountered permission to remove one or both ovaries as an intraoperative decision.  She understands that we will attempt a laparoscopic approach but any time during the procedure I may convert this to a TAH as an intraoperative decision and she understands and accepts this. The absolute irreversible sterility of hysterectomy was discussed and sexuality following hysterectomy was also reviewed to include the possibility of persistent dyspareunia or orgasmic dysfunction following the procedure. The expected intraoperative and postoperative courses as well as the recovery period were reviewed. The risks of infection, prolonged antibiotics, reoperation for abscess or hematoma formation was discussed. The risks of hemorrhage necessitating transfusion and the risks of transfusion reaction, hepatitis, HIV, mad cow disease and other unknown entities was also discussed. Incisional complications to include opening and draining of incisions and closure by secondary intention, dehiscence and long-term issues of keloid/cosmetics and hernia formation were reviewed. The risk of  inadvertent injury to internal organs including bowel, bladder, ureters, vessels, nerves either  immediately recognized or delay recognized necessitating major exploratory reparative surgeries and future reparative surgeries including bowel resection, ostomy formation, bladder repair, ureteral damage repair was discussed with her. 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 She also most recently has had adjustments in her hypertensive medications and her last blood pressure reading was normal at 112/66. Lastly I reviewed with the patient that the hysterectomy may not address her right-sided abdominal pain and that this may be GI related and that this pain may continue or worsen following the procedure and she may need to be further evaluated by gastroenterology. The patient's comfortable with proceeding with hysterectomy now and addressing this postoperatively if her pain persists. The patient's questions were answered to her satisfaction and she is ready to proceed with surgery.    Dara LordsFONTAINE,Jessica Casanova P MD, 3:35 PM 06/17/2016

## 2016-06-17 NOTE — Patient Instructions (Signed)
Followup for surgery as scheduled. 

## 2016-06-17 NOTE — Progress Notes (Signed)
Jessica Molina 10/11/1975 [id number]   Preoperative consult  Chief complaint: Menorrhagia, dysmenorrhea, right abdominal pain.  History of present illness: 40 y.o. [name] with history of worsening menorrhagia and dysmenorrhea requiring triple protection with bleedthrough episodes and significant discomfort. Was to proceed with hysterectomy earlier this year but due to work constraints she postponed this. Had a negative sonohysterogram with negative endometrial biopsy. Most recently her menses have continued to worsen with significant discomfort and over the last several weeks has been having right-sided pain disabling when it occurs. Also associated nausea with some vomiting. Has been evaluated with a negative ultrasound and negative abdominal CT. Is status post appendectomy in the past. Does feel that the pain overall is getting better and wants to proceed with hysterectomy now for menorrhagia dysmenorrhea relief. Most recent hemoglobin was 14.  Endometrial biopsy 11/2015 showed proliferative endometrium and Pap smear/HPV 11/2015 was negative  Past medical history,surgical history, medications, allergies, family history and social history were all reviewed and documented in the EPIC chart.  ROS:  Was performed and pertinent positives and negatives are included in the history of present illness.  Exam:  Kennon PortelaKim Gardner assistant Vitals:   06/17/16 1407  BP: 112/66   General: well developed, well nourished female, no acute distress HEENT: normal  Lungs: clear to auscultation without wheezing, rales or rhonchi  Cardiac: regular rate without rubs, murmurs or gallops  Abdomen: soft, nontender without masses, guarding, rebound, organomegaly  Pelvic: external bus vagina: normal   Cervix: grossly normal  Uterus: normal size, midline and mobile, nontender  Adnexa: without masses or tenderness    Assessment/Plan:  40 y.o. [name] with history as above. Exam overall was normal without abdominal pain or  pelvic pain. Patient wants to proceed with hysterectomy for menstrual and dysmenorrhea control. Is status post BTL in the past. Also had C-section with twin delivery with 2 vaginal deliveries. Will plan on LAVH to allow for pelvic assessment given her other pain and to evaluate the bladder given her C-section history. I also reviewed with her prophylactic bilateral salpingectomies as a risk reductive surgery for "ovarian" cancer and she also wants to proceed with this but understands that if it would add significant risk to the procedure that I may not remove her fallopian tubes. We do plan on leaving her ovaries for continued hormonal production at the age of 40. She does understand the risk of ovarian conservation to include ovarian disease requiring surgery in the future as well as ovarian cancer. Also if significant pathology is encountered permission to remove one or both ovaries as an intraoperative decision.  She understands that we will attempt a laparoscopic approach but any time during the procedure I may convert this to a TAH as an intraoperative decision and she understands and accepts this. The absolute irreversible sterility of hysterectomy was discussed and sexuality following hysterectomy was also reviewed to include the possibility of persistent dyspareunia or orgasmic dysfunction following the procedure. The expected intraoperative and postoperative courses as well as the recovery period were reviewed. The risks of infection, prolonged antibiotics, reoperation for abscess or hematoma formation was discussed. The risks of hemorrhage necessitating transfusion and the risks of transfusion reaction, hepatitis, HIV, mad cow disease and other unknown entities was also discussed. Incisional complications to include opening and draining of incisions and closure by secondary intention, dehiscence and long-term issues of keloid/cosmetics and hernia formation were reviewed. The risk of inadvertent injury to  internal organs including bowel, bladder, ureters, vessels, nerves either immediately recognized or  delay recognized necessitating major exploratory reparative surgeries and future reparative surgeries including bowel resection, ostomy formation, bladder repair, ureteral damage repair was discussed with her. 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 She also most recently has had adjustments in her hypertensive medications and her last blood pressure reading was normal at 112/66. Lastly I reviewed with the patient that the hysterectomy may not address her right-sided abdominal pain and that this may be GI related and that this pain may continue or worsen following the procedure and she may need to be further evaluated by gastroenterology. The patient's comfortable with proceeding with hysterectomy now and addressing this postoperatively if her pain persists. The patient's questions were answered to her satisfaction and she is ready to proceed with surgery.    Dara Lords MD, 2:28 PM 06/17/2016

## 2016-06-18 ENCOUNTER — Ambulatory Visit: Payer: Managed Care, Other (non HMO) | Admitting: Medical

## 2016-06-18 NOTE — Patient Instructions (Signed)
Your procedure is scheduled on:  Tuesday, Sept. 26, 2017  [facility]:  7:30 AM  Pick up the phone at the desk and dial [phone].  Call this number if you have problems the morning of surgery: [phone].  Remember: Do NOT eat food or drink after:  Midnight Monday  Take these medicines the morning of surgery with a SIP OF WATER:  Amlodipine, Hydrochlorothiazide, Losartan, Xanax if needed  Do NOT wear jewelry (body piercing), metal hair clips/bobby pins, make-up, or nail polish. Do NOT wear lotions, powders, or perfumes.  You may wear deodorant. Do NOT shave for 48 hours prior to surgery. Do NOT bring valuables to the hospital. Contacts, dentures, or bridgework may not be worn into surgery.  Leave suitcase in car.  After surgery it may be brought to your room.  For patients admitted to the hospital, checkout time is 11:00 AM the day of discharge.

## 2016-06-19 ENCOUNTER — Encounter (HOSPITAL_COMMUNITY)
Admission: RE | Admit: 2016-06-19 | Discharge: 2016-06-19 | Disposition: A | Discharge status: Home / Self Care | Payer: Managed Care, Other (non HMO) | Source: Ambulatory Visit | Attending: Gynecology | Admitting: Gynecology

## 2016-06-19 ENCOUNTER — Encounter (HOSPITAL_COMMUNITY): Payer: Self-pay

## 2016-06-19 DIAGNOSIS — Z01812 Encounter for preprocedural laboratory examination: Secondary | ICD-10-CM | POA: Insufficient documentation

## 2016-06-19 HISTORY — DX: Other specified postprocedural states: R11.2

## 2016-06-19 HISTORY — DX: Other specified postprocedural states: Z98.890

## 2016-06-19 HISTORY — DX: Compression of vein: I87.1

## 2016-06-19 LAB — BASIC METABOLIC PANEL
Anion gap: 8 (ref 5–15)
BUN: 18 mg/dL (ref 6–20)
CO2: 28 mmol/L (ref 22–32)
Calcium: 9.9 mg/dL (ref 8.9–10.3)
Chloride: 98 mmol/L — ABNORMAL LOW (ref 101–111)
Creatinine, Ser: 0.7 mg/dL (ref 0.44–1.00)
GFR calc Af Amer: >60 mL/min (ref >60)
GFR calc non Af Amer: >60 mL/min (ref >60)
Glucose, Bld: 100 mg/dL — ABNORMAL HIGH (ref 65–99)
Potassium: 3.8 mmol/L (ref 3.5–5.1)
Sodium: 134 mmol/L — ABNORMAL LOW (ref 135–145)

## 2016-06-19 LAB — CBC
HCT: 38.3 % (ref 36.0–46.0)
Hemoglobin: 13.2 g/dL (ref 12.0–15.0)
MCH: 30.6 pg (ref 26.0–34.0)
MCHC: 34.5 g/dL (ref 30.0–36.0)
MCV: 88.7 fL (ref 78.0–100.0)
Platelets: 356 10*3/uL (ref 150–400)
RBC: 4.32 MIL/uL (ref 3.87–5.11)
RDW: 12.8 % (ref 11.5–15.5)
WBC: 8.7 10*3/uL (ref 4.0–10.5)

## 2016-06-19 LAB — TYPE AND SCREEN
ABO/RH(D): O POS
Antibody Screen: NEGATIVE

## 2016-06-19 LAB — ABO/RH: ABO/RH(D): O POS

## 2016-06-20 ENCOUNTER — Ambulatory Visit: Payer: Managed Care, Other (non HMO) | Admitting: Neurology

## 2016-06-24 ENCOUNTER — Other Ambulatory Visit: Payer: Self-pay | Admitting: Gynecology

## 2016-06-24 MED ORDER — PHENAZOPYRIDINE HCL 200 MG PO TABS: ORAL_TABLET | ORAL | 0 refills | Status: DC

## 2016-06-24 MED ORDER — DEXTROSE 5 % IV SOLN
2.0000 g | INTRAVENOUS | Status: AC
  Administered 2016-06-25: 2 g via INTRAVENOUS
  Filled 2016-06-24: qty 2

## 2016-06-25 ENCOUNTER — Encounter (HOSPITAL_COMMUNITY): Payer: Self-pay

## 2016-06-25 ENCOUNTER — Ambulatory Visit (HOSPITAL_COMMUNITY)
Admission: RE | Admit: 2016-06-25 | Discharge: 2016-06-26 | Disposition: A | Discharge status: Home / Self Care | Payer: Managed Care, Other (non HMO) | Source: Ambulatory Visit | Attending: Gynecology | Admitting: Gynecology

## 2016-06-25 ENCOUNTER — Encounter (HOSPITAL_COMMUNITY)
Admission: RE | Disposition: A | Discharge status: Home / Self Care | Payer: Self-pay | Source: Ambulatory Visit | Attending: Gynecology

## 2016-06-25 ENCOUNTER — Ambulatory Visit (HOSPITAL_COMMUNITY): Payer: Managed Care, Other (non HMO) | Admitting: Anesthesiology

## 2016-06-25 DIAGNOSIS — N946 Dysmenorrhea, unspecified: Secondary | ICD-10-CM

## 2016-06-25 DIAGNOSIS — R1031 Right lower quadrant pain: Secondary | ICD-10-CM | POA: Diagnosis not present

## 2016-06-25 DIAGNOSIS — N920 Excessive and frequent menstruation with regular cycle: Secondary | ICD-10-CM | POA: Diagnosis not present

## 2016-06-25 DIAGNOSIS — N7011 Chronic salpingitis: Secondary | ICD-10-CM | POA: Diagnosis not present

## 2016-06-25 DIAGNOSIS — G8929 Other chronic pain: Secondary | ICD-10-CM | POA: Diagnosis not present

## 2016-06-25 HISTORY — PX: LAPAROSCOPIC VAGINAL HYSTERECTOMY WITH SALPINGECTOMY: SHX6680

## 2016-06-25 HISTORY — PX: CYSTOSCOPY: SHX5120

## 2016-06-25 LAB — PREGNANCY, URINE: Preg Test, Ur: NEGATIVE

## 2016-06-25 SURGERY — HYSTERECTOMY, VAGINAL, LAPAROSCOPY-ASSISTED, WITH SALPINGECTOMY
Anesthesia: General | Site: Urethra

## 2016-06-25 MED ORDER — LOSARTAN POTASSIUM 50 MG PO TABS
100.0000 mg | ORAL_TABLET | Freq: Every day | ORAL | Status: DC
  Administered 2016-06-26: 100 mg via ORAL
  Filled 2016-06-25: qty 2

## 2016-06-25 MED ORDER — DEXAMETHASONE SODIUM PHOSPHATE 4 MG/ML IJ SOLN
INTRAMUSCULAR | Status: DC | PRN
  Administered 2016-06-25: 4 mg via INTRAVENOUS

## 2016-06-25 MED ORDER — MIDAZOLAM HCL 2 MG/2ML IJ SOLN
INTRAMUSCULAR | Status: DC | PRN
  Administered 2016-06-25: 2 mg via INTRAVENOUS

## 2016-06-25 MED ORDER — KETOROLAC TROMETHAMINE 30 MG/ML IJ SOLN
30.0000 mg | Freq: Four times a day (QID) | INTRAMUSCULAR | Status: DC
  Administered 2016-06-25 – 2016-06-26 (×3): 30 mg via INTRAVENOUS
  Filled 2016-06-25 (×3): qty 1

## 2016-06-25 MED ORDER — HYDROMORPHONE HCL 1 MG/ML IJ SOLN
INTRAMUSCULAR | Status: AC
  Filled 2016-06-25: qty 1

## 2016-06-25 MED ORDER — HYDROMORPHONE HCL 1 MG/ML IJ SOLN
INTRAMUSCULAR | Status: DC | PRN
  Administered 2016-06-25: .5 mg via INTRAVENOUS
  Administered 2016-06-25: 1 mg via INTRAVENOUS
  Administered 2016-06-25: .5 mg via INTRAVENOUS

## 2016-06-25 MED ORDER — PHENYLEPHRINE 40 MCG/ML (10ML) SYRINGE FOR IV PUSH (FOR BLOOD PRESSURE SUPPORT)
PREFILLED_SYRINGE | INTRAVENOUS | Status: AC
  Filled 2016-06-25: qty 40

## 2016-06-25 MED ORDER — PHENYLEPHRINE 8 MG IN D5W 100 ML (0.08MG/ML) PREMIX OPTIME
INJECTION | INTRAVENOUS | Status: DC | PRN
  Administered 2016-06-25: 80 ug/min via INTRAVENOUS

## 2016-06-25 MED ORDER — KETOROLAC TROMETHAMINE 30 MG/ML IJ SOLN: 30.0000 mg | Freq: Four times a day (QID) | INTRAMUSCULAR | Status: DC

## 2016-06-25 MED ORDER — PROPOFOL 10 MG/ML IV BOLUS
INTRAVENOUS | Status: DC | PRN
  Administered 2016-06-25: 200 mg via INTRAVENOUS
  Administered 2016-06-25: 50 mg via INTRAVENOUS

## 2016-06-25 MED ORDER — SUGAMMADEX SODIUM 200 MG/2ML IV SOLN
INTRAVENOUS | Status: AC
  Filled 2016-06-25: qty 2

## 2016-06-25 MED ORDER — SCOPOLAMINE 1 MG/3DAYS TD PT72
1.0000 | MEDICATED_PATCH | Freq: Once | TRANSDERMAL | Status: DC
  Administered 2016-06-25: 1.5 mg via TRANSDERMAL

## 2016-06-25 MED ORDER — DEXAMETHASONE SODIUM PHOSPHATE 4 MG/ML IJ SOLN
INTRAMUSCULAR | Status: AC
  Filled 2016-06-25: qty 1

## 2016-06-25 MED ORDER — KETOROLAC TROMETHAMINE 30 MG/ML IJ SOLN
INTRAMUSCULAR | Status: DC | PRN
  Administered 2016-06-25: 30 mg via INTRAVENOUS

## 2016-06-25 MED ORDER — PHENYLEPHRINE HCL 10 MG/ML IJ SOLN
INTRAMUSCULAR | Status: DC | PRN
  Administered 2016-06-25 (×5): 80 ug via INTRAVENOUS

## 2016-06-25 MED ORDER — OXYCODONE HCL 5 MG/5ML PO SOLN: 5.0000 mg | Freq: Once | ORAL | Status: DC | PRN

## 2016-06-25 MED ORDER — SUGAMMADEX SODIUM 200 MG/2ML IV SOLN
INTRAVENOUS | Status: DC | PRN
  Administered 2016-06-25: 200 mg via INTRAVENOUS

## 2016-06-25 MED ORDER — HYDROCHLOROTHIAZIDE 25 MG PO TABS
25.0000 mg | ORAL_TABLET | Freq: Every day | ORAL | Status: DC
  Administered 2016-06-26: 25 mg via ORAL
  Filled 2016-06-25: qty 1

## 2016-06-25 MED ORDER — HYDROMORPHONE HCL 1 MG/ML IJ SOLN
0.2500 mg | INTRAMUSCULAR | Status: DC | PRN
  Administered 2016-06-25 (×4): 0.5 mg via INTRAVENOUS

## 2016-06-25 MED ORDER — LIDOCAINE HCL (CARDIAC) 20 MG/ML IV SOLN
INTRAVENOUS | Status: AC
  Filled 2016-06-25: qty 5

## 2016-06-25 MED ORDER — MIDAZOLAM HCL 2 MG/2ML IJ SOLN
INTRAMUSCULAR | Status: AC
  Filled 2016-06-25: qty 2

## 2016-06-25 MED ORDER — MORPHINE SULFATE (PF) 4 MG/ML IV SOLN
2.0000 mg | INTRAVENOUS | Status: DC | PRN
  Administered 2016-06-25: 2 mg via INTRAVENOUS
  Administered 2016-06-25: 4 mg via INTRAVENOUS
  Administered 2016-06-25: 2 mg via INTRAVENOUS
  Filled 2016-06-25 (×3): qty 1

## 2016-06-25 MED ORDER — PROPOFOL 10 MG/ML IV BOLUS
INTRAVENOUS | Status: AC
  Filled 2016-06-25: qty 20

## 2016-06-25 MED ORDER — LOSARTAN POTASSIUM 50 MG PO TABS: 100.0000 mg | ORAL_TABLET | Freq: Every day | ORAL | Status: DC

## 2016-06-25 MED ORDER — ROCURONIUM BROMIDE 100 MG/10ML IV SOLN
INTRAVENOUS | Status: AC
  Filled 2016-06-25: qty 1

## 2016-06-25 MED ORDER — LIDOCAINE-EPINEPHRINE 1 %-1:100000 IJ SOLN
INTRAMUSCULAR | Status: DC | PRN
  Administered 2016-06-25: 10 mL

## 2016-06-25 MED ORDER — LIDOCAINE HCL (CARDIAC) 20 MG/ML IV SOLN
INTRAVENOUS | Status: DC | PRN
  Administered 2016-06-25: 80 mg via INTRAVENOUS

## 2016-06-25 MED ORDER — ONDANSETRON 4 MG PO TBDP
4.0000 mg | ORAL_TABLET | Freq: Three times a day (TID) | ORAL | Status: DC | PRN
Start: 2016-06-25 — End: 2016-06-25
  Administered 2016-06-25: 4 mg via ORAL
  Filled 2016-06-25 (×2): qty 1

## 2016-06-25 MED ORDER — ONDANSETRON HCL 4 MG/2ML IJ SOLN
INTRAMUSCULAR | Status: AC
  Filled 2016-06-25: qty 2

## 2016-06-25 MED ORDER — AMLODIPINE BESYLATE 10 MG PO TABS
10.0000 mg | ORAL_TABLET | Freq: Every day | ORAL | Status: DC
  Administered 2016-06-26: 10 mg via ORAL
  Filled 2016-06-25 (×2): qty 1

## 2016-06-25 MED ORDER — DEXTROSE-NACL 5-0.9 % IV SOLN
INTRAVENOUS | Status: DC
  Administered 2016-06-25 (×2): via INTRAVENOUS

## 2016-06-25 MED ORDER — DIPHENHYDRAMINE HCL 25 MG PO CAPS: 50.0000 mg | ORAL_CAPSULE | Freq: Four times a day (QID) | ORAL | Status: DC | PRN

## 2016-06-25 MED ORDER — OXYCODONE HCL 5 MG PO TABS: 5.0000 mg | ORAL_TABLET | Freq: Once | ORAL | Status: DC | PRN

## 2016-06-25 MED ORDER — HYDROMORPHONE HCL 1 MG/ML IJ SOLN
INTRAMUSCULAR | Status: AC
  Administered 2016-06-25: 0.5 mg via INTRAVENOUS
  Filled 2016-06-25: qty 1

## 2016-06-25 MED ORDER — PROMETHAZINE HCL 25 MG/ML IJ SOLN: 6.2500 mg | INTRAMUSCULAR | Status: DC | PRN

## 2016-06-25 MED ORDER — MEPERIDINE HCL 25 MG/ML IJ SOLN: 6.2500 mg | INTRAMUSCULAR | Status: DC | PRN

## 2016-06-25 MED ORDER — SCOPOLAMINE 1 MG/3DAYS TD PT72
MEDICATED_PATCH | TRANSDERMAL | Status: AC
  Administered 2016-06-25: 1.5 mg via TRANSDERMAL
  Filled 2016-06-25: qty 1

## 2016-06-25 MED ORDER — LIDOCAINE-EPINEPHRINE 1 %-1:100000 IJ SOLN
INTRAMUSCULAR | Status: AC
  Filled 2016-06-25: qty 1

## 2016-06-25 MED ORDER — FENTANYL CITRATE (PF) 100 MCG/2ML IJ SOLN
INTRAMUSCULAR | Status: DC | PRN
  Administered 2016-06-25 (×5): 50 ug via INTRAVENOUS

## 2016-06-25 MED ORDER — BUPIVACAINE HCL (PF) 0.25 % IJ SOLN
INTRAMUSCULAR | Status: AC
  Filled 2016-06-25: qty 30

## 2016-06-25 MED ORDER — ONDANSETRON HCL 4 MG/2ML IJ SOLN
INTRAMUSCULAR | Status: DC | PRN
  Administered 2016-06-25: 4 mg via INTRAVENOUS

## 2016-06-25 MED ORDER — ROCURONIUM BROMIDE 100 MG/10ML IV SOLN
INTRAVENOUS | Status: DC | PRN
  Administered 2016-06-25: 40 mg via INTRAVENOUS
  Administered 2016-06-25 (×2): 10 mg via INTRAVENOUS

## 2016-06-25 MED ORDER — LACTATED RINGERS IV SOLN
INTRAVENOUS | Status: DC
  Administered 2016-06-25: 09:00:00 via INTRAVENOUS
  Administered 2016-06-25: 125 mL/h via INTRAVENOUS
  Administered 2016-06-25: 10:00:00 via INTRAVENOUS

## 2016-06-25 MED ORDER — SCOPOLAMINE 1 MG/3DAYS TD PT72
MEDICATED_PATCH | TRANSDERMAL | Status: AC
  Filled 2016-06-25: qty 1

## 2016-06-25 MED ORDER — PROMETHAZINE HCL 25 MG PO TABS
25.0000 mg | ORAL_TABLET | Freq: Four times a day (QID) | ORAL | Status: DC | PRN
  Administered 2016-06-25 – 2016-06-26 (×2): 25 mg via ORAL
  Filled 2016-06-25 (×2): qty 1

## 2016-06-25 MED ORDER — FENTANYL CITRATE (PF) 250 MCG/5ML IJ SOLN
INTRAMUSCULAR | Status: AC
  Filled 2016-06-25: qty 5

## 2016-06-25 MED ORDER — BUPIVACAINE HCL (PF) 0.25 % IJ SOLN
INTRAMUSCULAR | Status: DC | PRN
  Administered 2016-06-25: 7 mL

## 2016-06-25 MED ORDER — HYDROCODONE-ACETAMINOPHEN 5-325 MG PO TABS
1.0000 | ORAL_TABLET | Freq: Four times a day (QID) | ORAL | Status: DC | PRN
  Administered 2016-06-25 – 2016-06-26 (×3): 2 via ORAL
  Filled 2016-06-25 (×3): qty 2

## 2016-06-25 MED ORDER — LACTATED RINGERS IR SOLN
Status: DC | PRN
  Administered 2016-06-25: 3000 mL

## 2016-06-25 SURGICAL SUPPLY — 44 items
APPLICATOR ARISTA FLEXITIP XL (MISCELLANEOUS) ×3 IMPLANT
CABLE HIGH FREQUENCY MONO STRZ (ELECTRODE) IMPLANT
CLOTH BEACON ORANGE TIMEOUT ST (SAFETY) ×3 IMPLANT
CONT PATH 16OZ SNAP LID 3702 (MISCELLANEOUS) ×3 IMPLANT
COVER BACK TABLE 60X90IN (DRAPES) ×3 IMPLANT
COVER MAYO STAND STRL (DRAPES) ×3 IMPLANT
DECANTER SPIKE VIAL GLASS SM (MISCELLANEOUS) ×6 IMPLANT
DRSG OPSITE POSTOP 3X4 (GAUZE/BANDAGES/DRESSINGS) ×3 IMPLANT
DURAPREP 26ML APPLICATOR (WOUND CARE) ×3 IMPLANT
ELECT REM PT RETURN 9FT ADLT (ELECTROSURGICAL) ×3
ELECTRODE REM PT RTRN 9FT ADLT (ELECTROSURGICAL) ×2 IMPLANT
FILTER SMOKE EVAC LAPAROSHD (FILTER) ×3 IMPLANT
GLOVE BIO SURGEON STRL SZ7.5 (GLOVE) ×9 IMPLANT
GLOVE BIOGEL PI IND STRL 7.0 (GLOVE) ×6 IMPLANT
GLOVE BIOGEL PI INDICATOR 7.0 (GLOVE) ×3
HEMOSTAT ARISTA ABSORB 3G PWDR (MISCELLANEOUS) ×3 IMPLANT
LEGGING LITHOTOMY PAIR STRL (DRAPES) ×3 IMPLANT
LIQUID BAND (GAUZE/BANDAGES/DRESSINGS) ×3 IMPLANT
NEEDLE MAYO CATGUT SZ4 (NEEDLE) IMPLANT
NS IRRIG 1000ML POUR BTL (IV SOLUTION) ×3 IMPLANT
PACK LAVH (CUSTOM PROCEDURE TRAY) ×3 IMPLANT
PACK ROBOTIC GOWN (GOWN DISPOSABLE) ×3 IMPLANT
PACK TRENDGUARD 450 HYBRID PRO (MISCELLANEOUS) ×2 IMPLANT
PAD OB MATERNITY 4.3X12.25 (PERSONAL CARE ITEMS) ×3 IMPLANT
PAD TRENDELENBURG POSITION (MISCELLANEOUS) IMPLANT
PROTECTOR NERVE ULNAR (MISCELLANEOUS) ×6 IMPLANT
SCISSORS LAP 5X35 DISP (ENDOMECHANICALS) IMPLANT
SET CYSTO W/LG BORE CLAMP LF (SET/KITS/TRAYS/PACK) ×3 IMPLANT
SET IRRIG TUBING LAPAROSCOPIC (IRRIGATION / IRRIGATOR) ×3 IMPLANT
SHEARS HARMONIC ACE PLUS 36CM (ENDOMECHANICALS) ×3 IMPLANT
SLEEVE XCEL OPT CAN 5 100 (ENDOMECHANICALS) ×3 IMPLANT
SOLUTION ELECTROLUBE (MISCELLANEOUS) IMPLANT
SUT PLAIN 4 0 FS 2 27 (SUTURE) ×3 IMPLANT
SUT VIC AB 0 CT1 18XCR BRD8 (SUTURE) ×4 IMPLANT
SUT VIC AB 0 CT1 36 (SUTURE) ×3 IMPLANT
SUT VIC AB 0 CT1 8-18 (SUTURE) ×2
SUT VICRYL 0 TIES 12 18 (SUTURE) ×3 IMPLANT
SUT VICRYL 0 UR6 27IN ABS (SUTURE) ×3 IMPLANT
SYR BULB IRRIGATION 50ML (SYRINGE) ×3 IMPLANT
TOWEL OR 17X24 6PK STRL BLUE (TOWEL DISPOSABLE) ×6 IMPLANT
TRAY FOLEY CATH SILVER 14FR (SET/KITS/TRAYS/PACK) ×3 IMPLANT
TRENDGUARD 450 HYBRID PRO PACK (MISCELLANEOUS) ×3
TROCAR XCEL NON-BLD 11X100MML (ENDOMECHANICALS) ×3 IMPLANT
WATER STERILE IRR 1000ML POUR (IV SOLUTION) ×3 IMPLANT

## 2016-06-25 NOTE — Addendum Note (Signed)
Addendum  created 06/25/16 1402 by Yolonda KidaAlison L Roshonda Sperl, CRNA   Sign clinical note

## 2016-06-25 NOTE — Progress Notes (Signed)
Patient ID: Jessica Molina, female   DOB: 02/14/1976, 40 y.o.   MRN: [id number] Jessica Abboticole Hilburn 08/28/1976 [id number]   Day of Surgery s/p Procedure(s): LAPAROSCOPIC ASSISTED VAGINAL HYSTERECTOMY WITH Bilateral SALPINGECTOMY, Lysis of Adhesions CYSTOSCOPY  Subjective: Patient reports feels well, no acute distress, pain severity reported mild, Yes.   taking PO, foley catheter in place, No. ambulating, No. passing flatus  Objective: Vital signs in last 24 hours: Temp:  [98.2 F (36.8 C)-98.9 F (37.2 C)] 98.6 F (37 C) (09/26 1355) Pulse Rate:  [70-100] 70 (09/26 1355) Resp:  [9-22] 16 (09/26 1355) BP: (95-132)/(68-86) 118/71 (09/26 1355) SpO2:  [96 %-100 %] 98 % (09/26 1355)    EXAM General: awake, alert and no distress Resp: clear to auscultation bilaterally Cardio: regular rate and rhythm GI: soft, minimal tenderness, bowel sounds active, dressing dry Lower Extremities: Without swelling or tenderness Vaginal Bleeding: scant  Lab Results:  No results for input(s): WBC, HGB, HCT, PLT in the last 72 hours.  Assessment: s/p Procedure(s): LAPAROSCOPIC ASSISTED VAGINAL HYSTERECTOMY WITH Bilateral SALPINGECTOMY, Lysis of Adhesions CYSTOSCOPY: stable and progressing well  Plan: Continue routine post operative care, Advance diet Encourage ambulation Advance to PO medication  Reviewed postoperative instructions, precautions and follow up. Anticipate discharge in the AM   LOS: 0 days    Dara LordsFONTAINE,Jinna Weinman P MD, 5:11 PM 06/25/2016

## 2016-06-25 NOTE — H&P (Signed)
The patient was examined.  I reviewed the proposed surgery and consent form with the patient.  The dictated history and physical is current and accurate and all questions were answered. The patient is ready to proceed with surgery and has a realistic understanding and expectation for the outcome.   Dara LordsFONTAINE,TIMOTHY P MD, 8:00 AM 06/25/2016

## 2016-06-25 NOTE — Anesthesia Preprocedure Evaluation (Signed)
Anesthesia Evaluation  Patient identified by MRN, date of birth, ID band Patient awake    Reviewed: Allergy & Precautions, NPO status , Patient's Chart, lab work & pertinent test results  History of Anesthesia Complications (+) PONV and history of anesthetic complications  Airway Mallampati: II  TM Distance: >3 FB Neck ROM: Full    Dental no notable dental hx.    Pulmonary neg pulmonary ROS,    Pulmonary exam normal breath sounds clear to auscultation       Cardiovascular hypertension, + Peripheral Vascular Disease  Normal cardiovascular exam Rhythm:Regular Rate:Normal     Neuro/Psych  Headaches, PSYCHIATRIC DISORDERS Anxiety    GI/Hepatic negative GI ROS, Neg liver ROS,   Endo/Other  negative endocrine ROS  Renal/GU negative Renal ROS     Musculoskeletal negative musculoskeletal ROS (+)   Abdominal   Peds  Hematology negative hematology ROS (+)   Anesthesia Other Findings   Reproductive/Obstetrics negative OB ROS                             Anesthesia Physical Anesthesia Plan  ASA: II  Anesthesia Plan: General   Post-op Pain Management:    Induction: Intravenous  Airway Management Planned: Oral ETT  Additional Equipment:   Intra-op Plan:   Post-operative Plan: Extubation in OR  Informed Consent: I have reviewed the patients History and Physical, chart, labs and discussed the procedure including the risks, benefits and alternatives for the proposed anesthesia with the patient or authorized representative who has indicated his/her understanding and acceptance.   Dental advisory given  Plan Discussed with: CRNA  Anesthesia Plan Comments:        Anesthesia Quick Evaluation

## 2016-06-25 NOTE — Op Note (Signed)
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 Cervix normal. Uterus normal size midline mobile. Adnexa without gross masses   Operative:  Anterior cul-de-sac normal,. Posterior cul-de-sac normal. Uterus normal size shape and contour. Right and left fallopian tubes with evidence of prior tubal sterilization otherwise grossly normal in appearance. Right and left ovaries grossly normal, free and mobile. No evidence of pelvic endometriosis or adhesions. Upper abdominal exam revealed omental to periumbilical region adhesions, lysed with the harmonic scalpel. Appendix not visualized but no periappendiceal/cecal adhesions. Liver smooth grossly normal in appearance. Gallbladder grossly normal in appearance.   Cystoscopy: Was adequate showing no evidence of bladder mucosal injury with bilateral ureteral jets.  Procedure:  The patient was taken to the operating room, placed in the low dorsal lithotomy position and underwent general anesthesia without difficulty. The timeout was performed by the surgical team. The patient received an abdominal preparation with DuraPrep by nursing personnel and a vaginal/perineal preparation with Betadine by the physician. An EUA was performed and a Hulka tenaculum was placed through the cervix as was an indwelling Foley catheter in sterile technique. The  patient was draped in the usual fashion and a vertical infraumbilical incision was made and the 10 mm direct entry trocar was placed under direct visualization without difficulty. The abdomen was subsequently insufflated and inspected finding a veil of omental scarring to the anterior abdominal wall just superior to the trocar placement. Right and left 5 mm suprapubic ports were then placed under direct visualization after transillumination of the vessels without difficulty. The uterus was elevated and the left fallopian tube was elevated and the ureter was identified away from the operative field. The mesosalpinx was transected using the harmonic scalpel with care to avoid the infundibulopelvic insertion to the ovary. The uterine ovarian pedicle was then also transected as was the round ligament without difficulty. A similar procedure was carried out on the other side. The peritoneal reflection of the broad ligament was transected to the lower uterine segment and the anterior vesico-uterine pertinent fold was transected using the harmonic scalpel without difficulty and with no significant scarring from her prior cesarean section noted. Attention was then turned to the vaginal portion of the procedure and the patient was placed in the high dorsal lithotomy position, the cervix visualized with a weighted speculum, the Hulka tenaculum removed and the cervix regrasped with a tenaculum. The cervical mucosa was then circumferentially injected using 1% lidocaine with 1:100,000 dilution of epinephrine and the cervical mucosa was then sharply incised circumferentially. The paracervical planes were then sharply developed and the posterior cul-de-sac was ultimately entered without difficulty and a long weighted speculum was placed. The right and left uterosacral ligaments were identified, clamped cut and ligated using 0 Vicryl suture and tagged for future reference. The anterior vesicouterine plane was sharply and bluntly  developed until the anterior cul-de-sac was bluntly entered without difficulty. The uterus was then progressively freed from its attachments through  clamping, cutting and ligating of the paracervical and parametrial tissues using 0 Vicryl suture in interrupted stitch. After identifying and securing the uterine vessels bilaterally the uterus was then delivered through the vagina and the remaining tissue attachments were clamped cut and ligated using 0 Vicryl suture and the specimen was removed and sent to pathology. The long weighted speculum was replaced with a shorter weighted speculum and the intestines were packed from the cul-de-sac using a tagged tail sponge. The posterior vaginal cuff was then run from uterosacral ligament to uterosacral ligament using 0 Vicryl suture in a running interlocking stitch. The sponge was removed, hemostasis visualized and the vagina was closed anterior to posterior using 0 Vicryl suture in interrupted stitch. The vagina was irrigated showing adequate hemostasis. The Foley catheter was then removed and cystoscopy was performed showing bilateral ureteral jets, the patient previously having received Pyridium, and the bladder mucosa appeared normal without evidence of injury. The Foley catheter was replaced, the patient placed in a low dorsal lithotomy position, the surgical team regloved and regowned and the abdomen was reinsufflated, copiously irrigated and reinspected. Several small bleeding points were addressed with bipolar cautery at the vaginal cuff and prophylactic Arista was placed across the cul-de-sac. The ovaries were reinspected showing good hemostasis and no evidence of vascular compromise. Attention was then turned to the omental adhesions and the 5 mm laparoscope was placed through the right suprapubic port and using the harmonic scalpel through the left suprapubic port the omental adhesions were lysed and inspected showing adequate hemostasis. The 5 mm ports were then  removed under direct visualization showing good hemostasis and the infraumbilical port was removed as the gas was allowed to escape showing adequate hemostasis no evidence of hernia formation. All skin incisions were injected using 0.25% Marcaine and the infra umbilical port was closed using 0 Vicryl suture in interrupted subcutaneous fascial stitch and the skin reapproximated using 4-0 plane and a running subcuticular stitch. LiquiBand skin adhesive was applied to all skin incisions. The patient received intraoperative Toradol, was awakened without difficulty and taken to the recovery room in good condition having tolerated the procedure well.     Dara Lords MD, 11:02 AM 06/25/2016

## 2016-06-25 NOTE — Anesthesia Procedure Notes (Signed)
Procedure Name: Intubation Date/Time: 06/25/2016 8:47 AM Performed by: Elbert EwingsHYMER, Kemauri Musa S Pre-anesthesia Checklist: Patient identified, Emergency Drugs available, Suction available, Patient being monitored and Timeout performed Patient Re-evaluated:Patient Re-evaluated prior to inductionOxygen Delivery Method: Circle system utilized Preoxygenation: Pre-oxygenation with 100% oxygen Intubation Type: IV induction Ventilation: Mask ventilation without difficulty Laryngoscope Size: Mac and 3 Grade View: Grade I Tube type: Oral Tube size: 7.0 mm Number of attempts: 1 Airway Equipment and Method: Stylet Secured at: 20 cm Tube secured with: Tape Dental Injury: Teeth and Oropharynx as per pre-operative assessment

## 2016-06-25 NOTE — Anesthesia Postprocedure Evaluation (Signed)
Anesthesia Post Note  Patient: Jessica Molina  Procedure(s) Performed: Procedure(s) (LRB): LAPAROSCOPIC ASSISTED VAGINAL HYSTERECTOMY WITH Bilateral SALPINGECTOMY, Lysis of Adhesions (Bilateral) CYSTOSCOPY (N/A)  Patient location during evaluation: [facility] Anesthesia Type: General Level of consciousness: awake, awake and alert, oriented and patient cooperative Pain management: pain level controlled Vital Signs Assessment: post-procedure vital signs reviewed and stable Respiratory status: spontaneous breathing, nonlabored ventilation, respiratory function stable and patient connected to nasal cannula oxygen Cardiovascular status: stable Postop Assessment: no headache and no backache (N/V--RN made aware and intervening.) Anesthetic complications: no     Last Vitals:  Vitals:   06/25/16 1245 06/25/16 1355  BP: 115/72 118/71  Pulse: 86 70  Resp: 16 16  Temp: 37.1 C 37 C    Last Pain:  Vitals:   06/25/16 1355  TempSrc: Oral  PainSc:    Pain Goal: Patients Stated Pain Goal: 3 (06/25/16 0757)               [name]

## 2016-06-25 NOTE — Anesthesia Postprocedure Evaluation (Signed)
Anesthesia Post Note  Patient: Jessica Molina  Procedure(s) Performed: Procedure(s) (LRB): LAPAROSCOPIC ASSISTED VAGINAL HYSTERECTOMY WITH Bilateral SALPINGECTOMY, Lysis of Adhesions (Bilateral) CYSTOSCOPY (N/A)  Patient location during evaluation: PACU Anesthesia Type: General Level of consciousness: sedated and patient cooperative Pain management: pain level controlled Vital Signs Assessment: post-procedure vital signs reviewed and stable Respiratory status: spontaneous breathing Cardiovascular status: stable Anesthetic complications: no     Last Vitals:  Vitals:   06/25/16 1145 06/25/16 1200  BP: 109/68 95/75  Pulse: 77 82  Resp: 14 14  Temp:      Last Pain:  Vitals:   06/25/16 1200  TempSrc:   PainSc: 6    Pain Goal: Patients Stated Pain Goal: 3 (06/25/16 0757)               Lewie LoronJohn Aziah Brostrom

## 2016-06-25 NOTE — Transfer of Care (Signed)
Immediate Anesthesia Transfer of Care Note  Patient: Jessica Molina  Procedure(s) Performed: Procedure(s): LAPAROSCOPIC ASSISTED VAGINAL HYSTERECTOMY WITH Bilateral SALPINGECTOMY, Lysis of Adhesions (Bilateral) CYSTOSCOPY (N/A)  Patient Location: PACU  Anesthesia Type:General  Level of Consciousness: awake, alert  and oriented  Airway & Oxygen Therapy: Patient Spontanous Breathing, nasal canula  Post-op Assessment: Report given to RN and Post -op Vital signs reviewed and stable  Post vital signs: Reviewed and stable  Last Vitals:  Vitals:   06/25/16 0757  BP: 132/86  Pulse: 71  Resp: 20  Temp: 36.8 C    Last Pain:  Vitals:   06/25/16 0757  TempSrc: Oral  PainSc: 4       Patients Stated Pain Goal: 3 (06/25/16 0757)  Complications: No apparent anesthesia complications

## 2016-06-26 DIAGNOSIS — N920 Excessive and frequent menstruation with regular cycle: Secondary | ICD-10-CM | POA: Diagnosis not present

## 2016-06-26 MED ORDER — HYDROCODONE-ACETAMINOPHEN 5-325 MG PO TABS: 1.0000 | ORAL_TABLET | Freq: Four times a day (QID) | ORAL | 0 refills | Status: DC | PRN

## 2016-06-26 NOTE — Progress Notes (Signed)
Jessica Molina 05/21/1976 [id number]   1 Day Post-Op Procedure(s) (LRB): LAPAROSCOPIC ASSISTED VAGINAL HYSTERECTOMY WITH Bilateral SALPINGECTOMY, Lysis of Adhesions (Bilateral) CYSTOSCOPY (N/A)  Subjective: Patient reports feels well, no acute distress, pain severity reported mild, Yes.   taking PO, foley catheter out, No. voiding, Yes.   ambulating, No. passing flatus  Objective: Vital signs in last 24 hours: Temp:  [98.2 F (36.8 C)-98.9 F (37.2 C)] 98.2 F (36.8 C) (09/27 0517) Pulse Rate:  [62-100] 63 (09/27 0517) Resp:  [9-22] 14 (09/27 0517) BP: (95-132)/(67-86) 107/68 (09/27 0517) SpO2:  [96 %-100 %] 97 % (09/27 0517) Last BM Date: 06/24/16    EXAM General: awake, alert and no distress Resp: clear to auscultation bilaterally Cardio: regular rate and rhythm GI: soft, minimal tender, bowel sounds active, incisions dry Lower Extremities: Without swelling or tenderness Vaginal Bleeding: Reported scant   Assessment: s/p Procedure(s): LAPAROSCOPIC ASSISTED VAGINAL HYSTERECTOMY WITH Bilateral SALPINGECTOMY, Lysis of Adhesions CYSTOSCOPY: progressing well, ready for discharge after voiding.    Plan: Discharge home today.  Precautions, instructions and follow up were discussed with the patient.  Prescriptions provided per AVS.  Patient to call the office to arrange a post-operative appointmant in 2 weeks.    Dara LordsFONTAINE,Damarea Merkel P MD, 7:33 AM 06/26/2016

## 2016-06-26 NOTE — Progress Notes (Signed)
Patient discharged home with husband. Discharge paperwork and instructions reviewed. Prescription with patient. No questions at this time.

## 2016-06-26 NOTE — Discharge Instructions (Signed)
°  Postoperative Instructions Hysterectomy ° °Dr. Rashawn Rolon and the nursing staff have discussed postoperative instructions with you.  If you have any questions please ask them before you leave the hospital, or call Dr Anael Rosch’s office at [phone].   ° °We would like to emphasize the following instructions: ° ° °  Call the office to make your follow-up appointment as recommended by Dr Daymian Lill (usually 2 weeks). ° °  You were given a prescription, or one was ordered for you at the pharmacy you designated.  Get that prescription filled and take the medication according to instructions. ° °  You may eat a regular diet, but slowly until you start having bowel movements. ° °  Drink plenty of water daily. ° °  Nothing in the vagina (intercourse, douching, objects of any kind) until released by Dr Matilde Markie. ° °  No driving for two weeks.  Wait to be cleared by Dr Carly Sabo at your first post op check.  Car rides (short) are ok after several days at home, as long as you are not having significant pain, but no traveling out of town. ° °  You may shower, but no baths.  Walking up and down stairs is ok.  No heavy lifting, prolonged standing, repeated bending or any “working out” until your first post op check. ° °  Rest frequently, listen to your body and do not push yourself and overdo it. ° °  Call if: ° °o Your pain medication does not seem strong enough. °o Worsening pain or abdominal bloating °o Persistent nausea or vomiting °o Difficulty with urination or bowel movements. °o Temperature of 101 degrees or higher. °o Bleeding heavier then staining (clots or period type flow). °o Incisions become red, tender or begin to drain. °o You have any questions or concerns. °

## 2016-06-27 ENCOUNTER — Ambulatory Visit: Payer: Managed Care, Other (non HMO) | Admitting: Gynecology

## 2016-06-27 ENCOUNTER — Telehealth: Payer: Self-pay | Admitting: *Deleted

## 2016-06-27 MED ORDER — IBUPROFEN 800 MG PO TABS: 800.0000 mg | ORAL_TABLET | Freq: Three times a day (TID) | ORAL | 0 refills | Status: DC | PRN

## 2016-06-27 MED ORDER — PROMETHAZINE HCL 25 MG PO TABS: ORAL_TABLET | ORAL | 0 refills | Status: DC

## 2016-06-27 NOTE — Telephone Encounter (Signed)
I know she has used Phenergan in the past which seems to help with her nausea. Phenergan 25 mg tablet #30 one to 2 by mouth every 6-8 hours for nausea. Alternative would be Zofran if she would like that then that would be #38 mg tablet 1 by mouth every 8 hours as needed for nausea. I would add ibuprofen 800 mg 1 every 8 hours for the next several days to help with background pain #30 and then use the narcotic on top of that as needed. I can refill that if she runs low if needed still but hopefully in the next day or 2 she will turn the corner.

## 2016-06-27 NOTE — Telephone Encounter (Signed)
Pt is post LAVH ON 06/25/16 called today c/o being very nauseated and having diarrhea, no vomiting, no fever, able to urinate.  Also states she is taking pain medication (hydrocodone) every 6 hours and still hurting, wasn't able to sleep last night due to pain nausea.Pt would like recommendations? Please advise

## 2016-06-27 NOTE — Telephone Encounter (Signed)
Pt informed with the below note, would like phenergan 25 mg, both Rx sent.

## 2016-06-28 ENCOUNTER — Encounter (HOSPITAL_COMMUNITY): Payer: Self-pay | Admitting: Gynecology

## 2016-06-28 NOTE — Addendum Note (Signed)
Addendum  created 06/28/16 1042 by Randa SpikeMyra D Kiari Hosmer, CRNA   Charge Capture section accepted

## 2016-07-01 ENCOUNTER — Telehealth: Payer: Self-pay | Admitting: *Deleted

## 2016-07-01 ENCOUNTER — Ambulatory Visit (INDEPENDENT_AMBULATORY_CARE_PROVIDER_SITE_OTHER): Payer: Managed Care, Other (non HMO) | Admitting: Gynecology

## 2016-07-01 ENCOUNTER — Encounter: Payer: Self-pay | Admitting: Gynecology

## 2016-07-01 VITALS — BP 118/76 | Temp 97.8°F

## 2016-07-01 DIAGNOSIS — Z9889 Other specified postprocedural states: Secondary | ICD-10-CM

## 2016-07-01 DIAGNOSIS — R102 Pelvic and perineal pain: Secondary | ICD-10-CM

## 2016-07-01 LAB — URINALYSIS W MICROSCOPIC + REFLEX CULTURE
Bilirubin Urine: NEGATIVE
Casts: NONE SEEN [LPF]
Crystals: NONE SEEN [HPF]
Glucose, UA: NEGATIVE
Ketones, ur: NEGATIVE
Leukocytes, UA: NEGATIVE
Nitrite: NEGATIVE
Protein, ur: NEGATIVE
Specific Gravity, Urine: <1.005 (ref 1.001–1.035)
WBC, UA: NONE SEEN WBC/HPF (ref <=5)
Yeast: NONE SEEN [HPF]
pH: 7 (ref 5.0–8.0)

## 2016-07-01 LAB — CBC WITH DIFFERENTIAL/PLATELET
Basophils Absolute: 0 cells/uL (ref 0–200)
Basophils Relative: 0 %
Eosinophils Absolute: 230 cells/uL (ref 15–500)
Eosinophils Relative: 2 %
HCT: 35 % (ref 35.0–45.0)
Hemoglobin: 11.6 g/dL — ABNORMAL LOW (ref 11.7–15.5)
Lymphocytes Relative: 20 %
Lymphs Abs: 2300 cells/uL (ref 850–3900)
MCH: 29.7 pg (ref 27.0–33.0)
MCHC: 33.1 g/dL (ref 32.0–36.0)
MCV: 89.7 fL (ref 80.0–100.0)
MPV: 10 fL (ref 7.5–12.5)
Monocytes Absolute: 805 cells/uL (ref 200–950)
Monocytes Relative: 7 %
Neutro Abs: 8165 cells/uL — ABNORMAL HIGH (ref 1500–7800)
Neutrophils Relative %: 71 %
Platelets: 456 10*3/uL — ABNORMAL HIGH (ref 140–400)
RBC: 3.9 MIL/uL (ref 3.80–5.10)
RDW: 12.9 % (ref 11.0–15.0)
WBC: 11.5 10*3/uL — ABNORMAL HIGH (ref 3.8–10.8)

## 2016-07-01 MED ORDER — HYDROCODONE-ACETAMINOPHEN 5-325 MG PO TABS: 1.0000 | ORAL_TABLET | Freq: Four times a day (QID) | ORAL | 0 refills | Status: DC | PRN

## 2016-07-01 NOTE — Telephone Encounter (Signed)
recommend office visit to let me examine her

## 2016-07-01 NOTE — Patient Instructions (Signed)
Follow up in 2 weeks. Her scheduled postoperative appointment.  Call sooner if any

## 2016-07-01 NOTE — Progress Notes (Signed)
    Jessica Molina 04/19/1976 [id number]        40 y.o.  [name] one week following LAVH bilateral salpingectomies. Notes pelvic pain with bowel movements. Requesting a refill of her pain medication. Is eating and drinking without difficulty. Voiding freely without discomfort. Having several bowel movements daily. Initially diarrhea but now are becoming solid. No fever or chills. Overall is feeling better each day following the surgery  Past medical history,surgical history, problem list, medications, allergies, family history and social history were all reviewed and documented in the EPIC chart.  Directed ROS with pertinent positives and negatives documented in the history of present illness/assessment and plan.  Exam: Kennon PortelaKim Gardner assistant Vitals:   07/01/16 1117  BP: 118/76  Temp: 97.8 F (36.6 C)  TempSrc: Oral   General appearance:  NormalIn no acute distress HEENT normal Lungs clear bilaterally Cardiac regular rate no rubs murmurs or gallops Abdomen soft with active bowel sounds. Minimal tenderness to deep palpation. Umbilical dressing removed. Incision sites all dry and intact area Vaginal exam with cuff intact. Minimal tenderness to palpation. No gross masses. Rectal exam normal without masses or significant tenderness   Assessment/Plan:  40 y.o. [name] with postoperative pain particular with bowel movements. Reviewed with patient either normal healing or possible small hematoma at the cuff. Will check baseline CBC. Options for study to include ultrasound to look at this area discussed but I think at this point will hold as I would not do anything different I saw a small hematoma. No evidence of infection without fever and feeling better each day. No evidence for more significant injury such as bowel or bladder having regular bowel movements no nausea or vomiting eating without difficulty and voiding without difficulty. Refill of her pain medication Norco #. 30. Continue to stay  active as long as she continues to feel better day by day and will monitor. She has any questions or concerns she is to call me. ASAP call precautions reviewed. She does have a postoperative appointment scheduled in 2 weeks and will keep that appointment.    Dara LordsFONTAINE,Jessica Molina P MD, 1:00 PM 07/01/2016

## 2016-07-01 NOTE — Telephone Encounter (Signed)
Pt is post LAVH ON 06/25/16, called today with couple of questions  1. Asked if normal to have pressure in vagina/abdomen area, states when passing gas and having bowel movement the pain is terrible. No pain with urination.  2. Temp ranges 99.0 has not be any higher, states she cold all the time, has woke up sweating after sleeping, but not fever.  Has felt dizzy at time when getting up to shower, blood pressure this am was 124/86. Only showers when husband there to help.  3.asked if refill on hydrocodone could be picked up, she is taking the ibuprofen every 8 hours, but still has to take 1-2 at tablet of hydrocodone for pain.   Please advise

## 2016-07-01 NOTE — Telephone Encounter (Signed)
Pt informed with the below note, will call husband to pick her up to bring her.

## 2016-07-02 LAB — URINE CULTURE: Organism ID, Bacteria: NO GROWTH

## 2016-07-04 ENCOUNTER — Telehealth: Payer: Self-pay | Admitting: Gynecology

## 2016-07-04 ENCOUNTER — Telehealth: Payer: Self-pay

## 2016-07-04 MED ORDER — HYDROCODONE-ACETAMINOPHEN 5-325 MG PO TABS: 1.0000 | ORAL_TABLET | Freq: Four times a day (QID) | ORAL | 0 refills | Status: DC | PRN

## 2016-07-04 NOTE — Telephone Encounter (Signed)
Patient received Rx for hydrocodone/acet #30 on Monday 10/2.  She called today to request another Rx and husband to pick up tomorrow.  She said she is using between 5-6 a day. Sometimes she tries to take just one. She will be out of the Rx from 10/2 sometime later this weeknd.

## 2016-07-04 NOTE — Telephone Encounter (Signed)
Patient called for refill of her pain medication. She still has some left but will run out in several days and wanted to make sure she was covered over the weekend. I called her back myself to check on how she is doing. She is feeling better overall, walking more and feeling stronger. Still having discomfort with bowel movements but having regular daily bowel movements. No nausea, vomiting, diarrhea or constipation. Temperature 98 with no fever or chills. Voiding without difficulty. She is pushing the fluids and eating a regular diet. Will refill her hydrocodone acetaminophen 5/325 #30 and she already has an appointment to see me in a little over a week. She'll follow up sooner if any issues.

## 2016-07-15 ENCOUNTER — Telehealth: Payer: Self-pay | Admitting: Neurology

## 2016-07-15 ENCOUNTER — Ambulatory Visit (INDEPENDENT_AMBULATORY_CARE_PROVIDER_SITE_OTHER): Payer: Managed Care, Other (non HMO) | Admitting: Gynecology

## 2016-07-15 VITALS — BP 126/84

## 2016-07-15 DIAGNOSIS — Z9889 Other specified postprocedural states: Secondary | ICD-10-CM

## 2016-07-15 NOTE — Patient Instructions (Signed)
2 weeks for postoperative visit

## 2016-07-15 NOTE — Telephone Encounter (Signed)
Patient called to reschedule previously cancelled BOTOX appointment. Please call to schedule [phone].

## 2016-07-15 NOTE — Progress Notes (Signed)
    Jessica Molina 02/02/1976 [id number]        40 y.o.  [name] presents for her postoperative visit status post LAVH bilateral salpingectomy. Was having pain with bowel movements but this is better. Overall doing well. Did have 2 day episode of bleeding last week which required wearing a pad. No significant pain with this. Eating drinking voiding and having bowel movements without difficulty. No fever or chills.  Past medical history,surgical history, problem list, medications, allergies, family history and social history were all reviewed and documented in the EPIC chart.  Directed ROS with pertinent positives and negatives documented in the history of present illness/assessment and plan.  Exam: Jessica PortelaKim Molina assistant Vitals:   07/15/16 0805  BP: 126/84   General appearance:  Normal Abdomen soft nontender without masses guarding rebound. Incisions healed nicely. Pelvic external BUS vagina with cuff intact. Dark old blood staining noted but no bleeding. Bimanual exam without masses or tenderness  Assessment/Plan:  40 y.o. [name] with normal postoperative visit. Episode of bleeding last week. Was having rectal pain with bowel movements.This has resolved. Question whether she had a small hematoma at the cuff that she past. Regardless she is doing well now with no bleeding and normal exam. Will continue to slowly resume all activities with the exception of continued pelvic rest. Follow up in 2 weeks for final postoperative visit.  Jessica Molina,Jessica Molina P MD, 8:19 AM 07/15/2016

## 2016-07-17 NOTE — Telephone Encounter (Signed)
LMVM asking patient to call and schedule Botox injection.

## 2016-07-17 NOTE — Telephone Encounter (Signed)
Shirley would you mind calling to schedule this patient?  °

## 2016-07-30 ENCOUNTER — Other Ambulatory Visit: Payer: Self-pay | Admitting: Medical

## 2016-07-31 ENCOUNTER — Ambulatory Visit (INDEPENDENT_AMBULATORY_CARE_PROVIDER_SITE_OTHER): Payer: Managed Care, Other (non HMO) | Admitting: Gynecology

## 2016-07-31 ENCOUNTER — Encounter: Payer: Self-pay | Admitting: Gynecology

## 2016-07-31 VITALS — BP 118/74

## 2016-07-31 DIAGNOSIS — Z9889 Other specified postprocedural states: Secondary | ICD-10-CM

## 2016-07-31 NOTE — Patient Instructions (Signed)
Follow up in February/March 2018 for annual exam when you're due. Sooner if any issues.

## 2016-07-31 NOTE — Progress Notes (Signed)
    Jessica Molina 03/14/1976 [id number]        40 y.o.  [name] resents for her postoperative visit status post LAVH bilateral salpingectomies. Doing well without complaints.  Past medical history,surgical history, problem list, medications, allergies, family history and social history were all reviewed and documented in the EPIC chart.  Directed ROS with pertinent positives and negatives documented in the history of present illness/assessment and plan.  Exam: Kennon PortelaKim Gardner assistant Vitals:   07/31/16 0948  BP: 118/74   General appearance:  Normal Abdomen soft nontender without masses guarding rebound. Incisions healed nicely. Pelvic external BUS vagina with cuff healing nicely. Several sutures still in place. Bimanual without masses or tenderness.  Assessment/Plan:  40 y.o. [name] with normal postoperative visit. Will slowly resume all activities. Continue pelvic rest 2 weeks. Need to resume slowly discussed. Follow up February/March 2018 when due for annual exam, sooner if any issues. I again reviewed the benign pathology with her.    Dara LordsFONTAINE,TIMOTHY P MD, 10:09 AM 07/31/2016

## 2016-08-02 ENCOUNTER — Other Ambulatory Visit: Payer: Self-pay | Admitting: Family Medicine

## 2016-08-02 DIAGNOSIS — G4700 Insomnia, unspecified: Secondary | ICD-10-CM

## 2016-08-02 NOTE — Telephone Encounter (Signed)
Please advise. TL/CMA 

## 2016-08-06 NOTE — Telephone Encounter (Signed)
Pt returned Danielle's call. She advised she has not rec'd any calls from [facility]  She work 7:30-6. She is off on Wednesdays  She will call [facility] tomorrow.  She said if she gets a chance she will call today  She advise she was not aware she would have to call  pls c/a appt

## 2016-08-06 NOTE — Telephone Encounter (Signed)
Called pharmacy to schedule delivery of medication and they stated that they are waiting on patient to complete enrollment in order to send out the medication. I called the patient to inform her of this and she did not answer so I left a VM asking her to call us back. If she calls back the [facility] is in the apt notes. Please give this to her.

## 2016-08-07 ENCOUNTER — Ambulatory Visit: Payer: Managed Care, Other (non HMO) | Admitting: Neurology

## 2016-08-13 ENCOUNTER — Telehealth: Payer: Self-pay | Admitting: *Deleted

## 2016-08-13 NOTE — Telephone Encounter (Signed)
Pt called post LAVH, left message in voicemail c/o vaginal pressure, spotting and abdomen swelling, told to call if any issues, I called pt back and left message on her voicemail to schedule office visit with provider. As pt said she is at work now and said it was okay to leave message.

## 2016-08-14 ENCOUNTER — Telehealth: Payer: Self-pay | Admitting: Neurology

## 2016-08-14 ENCOUNTER — Encounter: Payer: Self-pay | Admitting: Gynecology

## 2016-08-14 ENCOUNTER — Ambulatory Visit (INDEPENDENT_AMBULATORY_CARE_PROVIDER_SITE_OTHER): Payer: Managed Care, Other (non HMO) | Admitting: Gynecology

## 2016-08-14 VITALS — BP 116/74

## 2016-08-14 DIAGNOSIS — R140 Abdominal distension (gaseous): Secondary | ICD-10-CM

## 2016-08-14 DIAGNOSIS — N898 Other specified noninflammatory disorders of vagina: Secondary | ICD-10-CM

## 2016-08-14 LAB — URINALYSIS W MICROSCOPIC + REFLEX CULTURE
Bacteria, UA: NONE SEEN [HPF]
Bilirubin Urine: NEGATIVE
Casts: NONE SEEN [LPF]
Crystals: NONE SEEN [HPF]
Glucose, UA: NEGATIVE
Ketones, ur: NEGATIVE
Leukocytes, UA: NEGATIVE
Nitrite: NEGATIVE
Protein, ur: NEGATIVE
RBC / HPF: NONE SEEN RBC/HPF (ref <=2)
Specific Gravity, Urine: 1.02 (ref 1.001–1.035)
WBC, UA: NONE SEEN WBC/HPF (ref <=5)
Yeast: NONE SEEN [HPF]
pH: 6 (ref 5.0–8.0)

## 2016-08-14 LAB — WET PREP FOR TRICH, YEAST, CLUE
Clue Cells Wet Prep HPF POC: NONE SEEN
Trich, Wet Prep: NONE SEEN

## 2016-08-14 MED ORDER — FLUCONAZOLE 150 MG PO TABS: 150.0000 mg | ORAL_TABLET | Freq: Once | ORAL | 0 refills | Status: AC

## 2016-08-14 NOTE — Patient Instructions (Signed)
Take the one Diflucan pill for the yeast. Try an over-the-counter probiotic for the GI symptoms area Follow up for the ultrasound as scheduled.

## 2016-08-14 NOTE — Telephone Encounter (Signed)
Pt called to schedule botox appt. She did call the pharmacy and has a copay. She advised her husband gets paid on Friday and she will call the pharmacy on Friday and pay the copay.

## 2016-08-14 NOTE — Progress Notes (Signed)
    Jessica Molina Every 12/26/1975 [id number]        40 y.o.  [name] presents having undergone LAVH bilateral salpingectomies 06/25/2016. Patient had pelvic discomfort with bowel movements following this and intermittent vaginal bleeding. We discussed possibility of cuff hematoma that was draining although none was palpated. Her bowel movement pain has resolved although does note some spotting intermittently. Also notes some abdominal bloating both in the epigastric as well as in the pelvic area. Has episodes of constipation and diarrhea. Some pelvic pressure. No urinary symptoms such as frequency dysuria urgency low back pain fever or chills. Eating and drinking well without nausea or vomiting. Lastly notes a slight yellowish vaginal discharge. No itching or some intermittent odor.  Past medical history,surgical history, problem list, medications, allergies, family history and social history were all reviewed and documented in the EPIC chart.  Directed ROS with pertinent positives and negatives documented in the history of present illness/assessment and plan.  Exam: Jessica PortelaKim Molina assistant Vitals:   08/14/16 1005  BP: 116/74   General appearance:  Normal Abdomen soft nontender without masses guarding rebound. Abdominal incisions healed nicely. Pelvic external BUS vagina with small area of granulation tissue made cuff but cuff otherwise well-healed. Bimanual without gross masses or tenderness.  Silver nitrate was applied to the small area of granulation tissue.  Assessment/Plan:  40 y.o. [name] with history and exam as above. Small area of granulation tissue I think accounted for the spotting which we treated with the silver nitrate. Bloating with intermittent diarrhea constipation. Does not have more dramatic symptoms to suggest C. difficile. I did recommend a trial of probiotics to see if this doesn't help with her GI symptoms. We will also check baseline ultrasound for pelvic pathology. Wet prep  does show yeast we'll treat with Diflucan 150 mg 1 dose. Urinalysis was negative.    Jessica LordsFONTAINE,Jessica Molina P MD, 10:20 AM 08/14/2016

## 2016-08-15 NOTE — Telephone Encounter (Signed)
error 

## 2016-08-19 ENCOUNTER — Encounter: Payer: Self-pay | Admitting: Gynecology

## 2016-08-19 ENCOUNTER — Ambulatory Visit (INDEPENDENT_AMBULATORY_CARE_PROVIDER_SITE_OTHER): Payer: Managed Care, Other (non HMO)

## 2016-08-19 ENCOUNTER — Ambulatory Visit (INDEPENDENT_AMBULATORY_CARE_PROVIDER_SITE_OTHER): Payer: Managed Care, Other (non HMO) | Admitting: Gynecology

## 2016-08-19 VITALS — BP 120/70

## 2016-08-19 DIAGNOSIS — R140 Abdominal distension (gaseous): Secondary | ICD-10-CM | POA: Diagnosis not present

## 2016-08-19 NOTE — Progress Notes (Signed)
    Hampton Abboticole Jepsen 03/17/1976 [id number]        40 y.o.  [name] status post LAVH bilateral salpingectomies 06/25/2016. His had an issue with abdominal bloating with intermittent diarrhea/constipation. Was recommended ultrasound in follow up to make sure there is no issues as far as cuff hematoma or other pelvic issues. Knows overall she is feeling better.  Past medical history,surgical history, problem list, medications, allergies, family history and social history were all reviewed and documented in the EPIC chart.  Directed ROS with pertinent positives and negatives documented in the history of present illness/assessment and plan.  Exam: Vitals:   08/19/16 1142  BP: 120/70   General appearance:  Normal  Ultrasound transvaginal status post hysterectomy shows right and left ovaries normal. No masses or other pathology noted. Excessive bowel activity was noted during the scan.  Assessment/Plan:  40 y.o. [name] with history as above.  Ultrasound is normal without evidence of cuff hematoma or other pathology. As patient is starting to feel better she'll continue to monitor and assuming all her symptoms resolve then she'll follow up in the March/April 2018, sooner if any issues.    Dara LordsFONTAINE,Milayna Rotenberg P MD, 12:09 PM 08/19/2016

## 2016-08-19 NOTE — Patient Instructions (Signed)
Follow up for annual exam in March/April 2018.

## 2016-08-20 ENCOUNTER — Telehealth: Payer: Self-pay | Admitting: Neurology

## 2016-08-20 NOTE — Telephone Encounter (Signed)
Pt scheduled botox appt -12/27. She is wanting to know if she could get in sooner. She said her ins.will change in January. I have added her to the wait list.

## 2016-08-20 NOTE — Telephone Encounter (Signed)
Jessica Molina; her medication came in today so if something opens up she is good to be scheduled whenever. I will keep her in mind in case we have cancellations next week as there are a lot of injections scheduled.

## 2016-08-26 ENCOUNTER — Ambulatory Visit: Payer: Managed Care, Other (non HMO) | Admitting: Gynecology

## 2016-08-26 ENCOUNTER — Other Ambulatory Visit: Payer: Managed Care, Other (non HMO)

## 2016-08-28 ENCOUNTER — Encounter: Payer: Self-pay | Admitting: *Deleted

## 2016-08-28 ENCOUNTER — Encounter: Payer: Self-pay | Admitting: Neurology

## 2016-08-28 ENCOUNTER — Ambulatory Visit (INDEPENDENT_AMBULATORY_CARE_PROVIDER_SITE_OTHER): Payer: Managed Care, Other (non HMO) | Admitting: Neurology

## 2016-08-28 VITALS — BP 126/85 | HR 62 | Ht 63.0 in | Wt 147.0 lb

## 2016-08-28 DIAGNOSIS — G43709 Chronic migraine without aura, not intractable, without status migrainosus: Secondary | ICD-10-CM | POA: Diagnosis not present

## 2016-08-28 MED ORDER — TRAMADOL HCL 50 MG PO TABS: 50.0000 mg | ORAL_TABLET | Freq: Four times a day (QID) | ORAL | 5 refills | Status: DC | PRN

## 2016-08-28 MED ORDER — ONDANSETRON 4 MG PO TBDP: 4.0000 mg | ORAL_TABLET | Freq: Three times a day (TID) | ORAL | 11 refills | Status: DC | PRN

## 2016-08-28 MED ORDER — NAPROXEN 500 MG PO TABS: 500.0000 mg | ORAL_TABLET | Freq: Two times a day (BID) | ORAL | 6 refills | Status: DC | PRN

## 2016-08-28 NOTE — Progress Notes (Signed)
**  Botox 100 units x 2 vials, Lot [id number], Exp 01/2019, [facility] [id number], specialty pharmacy.//mck,rn**

## 2016-08-28 NOTE — Progress Notes (Signed)
PATIENT: Jessica Molina DOB: 1975-10-18  Chief Complaint  Patient presents with  . 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  In February 2016, she developed significant blood pressure variation, with associated strokelike symptoms, slurred speech, she was evaluated at [facility], was diagnosed with atypical left carotid artery aneurysm, had star close device in May 2016 by neurosurgeon Dr. Adela Glimpse at [facility], has been followed up regularly, which showed no recurrent aneurysm. 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  She reported migraine headaches since 2015, sometimes preceding by visual aura, lasting for a few hours, followed by lateralized severe pounding headache with light noise sensitivity, nauseous, her headache last about one day, since February 2017, she is having headache 2-3 times each week, she has tried tramadol without significant help.  Previously she tried Topamax, complains of numbness tingling of her hands, without helping her headache much, antidepression in the past, cause worsening mood swing,  Trigger for her migraines are weather change, stress, sleep deprivation, she work on night shift 7 PM to 7 AM 2 days a week  She is now taking frequent over counter medication, Aleve Tylenol with limited help  UPDATE May 22 2016: She is with her daughter at today's clinical visit, she  continued to have 3-4 headaches each week, vortex area severe pounding headache with associated light noise sensitivity, it can last for 2 days, she has tried Imitrex, Maxalt without helping her headaches, sleep usually help,  For preventive medications she has tried Topamax, without help, currently taking nortriptyline 20 mg every night, no significant side effect noticed.  We have personally reviewed MRI of the brain in August 2017, nonspecific white matter small vessel disease, MRA of the brain was normal  UPDATE Nov 29th 2017: This is her first Botox injection as migraine prevention, related question was answered, she still has migraine headache 3 times each week, lasting for 1- 2days, has tried Imitrex tablets, Maxalt dissolvable, Relpax, with limited help, insurance does not cover cambia, Fioricet, she is now taking tramadol as needed.  REVIEW OF SYSTEMS: Full 14 system review of systems performed and notable only for as above  ALLERGIES: Allergies  Allergen Reactions  . Lisinopril Cough  . Metoprolol Other (See Comments)    did not feel well when taking     HOME MEDICATIONS: Current Outpatient Prescriptions  Medication Sig Dispense Refill  . amLODipine (NORVASC) 10 MG tablet take one tablet by mouth once daily.    . hydrochlorothiazide (HYDRODIURIL) 25 MG tablet Take 25 mg by mouth daily.    Marland Kitchen losartan (COZAAR) 100 MG tablet Take 100 mg by mouth daily. Reported on 11/07/2015    . Multiple Vitamin (MULTIVITAMIN WITH MINERALS) TABS tablet Take 1 tablet by mouth daily.    . Riboflavin (B2) 100 MG TABS Take 1 tablet by mouth every morning.    . traMADol (ULTRAM) 50 MG tablet Take by mouth every 6 (six) hours as needed.    Marland Kitchen  zolpidem (AMBIEN CR) 12.5 MG CR tablet TAKE 1 TABLET AT BEDTIME 30 tablet 2   No current facility-administered medications for this visit.     PAST MEDICAL HISTORY: Past Medical History:  Diagnosis Date  . Aneurysm (HCC) 01/2015   Left carotid, repaired with  no special restrictions or treatments to follow  . Anxiety   . Hypertension   . Migraine   . PONV (postoperative nausea and vomiting)    hard to wake up after appendectomy  . Renal vein stenosis    "Kinked"    PAST SURGICAL HISTORY: Past Surgical History:  Procedure Laterality Date  . ANEURYSM SURGERY  01/2015  . APPENDECTOMY    . BTL  2006  . CESAREAN SECTION     Twins,  BTL  . CYSTOSCOPY N/A 06/25/2016   Procedure: CYSTOSCOPY;  Surgeon: Dara Lordsimothy P Fontaine, MD;  Location: [facility];  Service: Gynecology;  Laterality: N/A;  . LAPAROSCOPIC VAGINAL HYSTERECTOMY WITH SALPINGECTOMY Bilateral 06/25/2016   Procedure: LAPAROSCOPIC ASSISTED VAGINAL HYSTERECTOMY WITH Bilateral SALPINGECTOMY, Lysis of Adhesions;  Surgeon: Dara Lordsimothy P Fontaine, MD;  Location: [facility];  Service: Gynecology;  Laterality: Bilateral;  . RENAL ANGIOPLASTY      FAMILY HISTORY: Family History  Problem Relation Age of Onset  . Breast cancer Mother     breast   . Thyroid disease Mother   . Cancer Mother [id number]    breast  . Hyperlipidemia Mother   . Hypertension Father   . Stroke Father   . Melanoma Father     melanoma   . Cancer Father     melanoma  . Breast cancer Maternal Grandmother     breast   . Thyroid disease Maternal Grandmother   . Cancer Maternal Grandfather     unknown   . Cancer Paternal Grandmother     unknow     SOCIAL HISTORY:  Social History   Social History  . Marital status: Married    Spouse name: N/A  . Number of children: 4  . Years of education: HS+   Occupational History  . Nurse Tech    Social History Main Topics  . Smoking status: Never Smoker  . Smokeless tobacco: Never Used  . Alcohol use 0.0 oz/week     Comment: 2-3 drinks per week  . Drug use: No  . Sexual activity: Yes    Partners: Male    Birth control/ protection: Surgical   Other Topics Concern  . Not on file   Social History Narrative   Lives at home with husband and children.   Right-handed.   2-3 cups  caffeine per week.     PHYSICAL EXAM   Vitals:   08/28/16 1610  BP: 126/85  Pulse: 62  Weight: 147 lb (66.7 kg)  Height: 5\' 3"  (1.6 m)    Not recorded      Body mass index is 26.04 kg/m.  PHYSICAL EXAMNIATION:  Gen: NAD, conversant, well nourised, obese, well groomed                     Cardiovascular: Regular rate rhythm, no peripheral edema, warm, nontender. Eyes: Conjunctivae clear without exudates or hemorrhage Neck: Supple, no carotid bruise. Pulmonary: Clear to auscultation bilaterally   NEUROLOGICAL EXAM:  MENTAL STATUS: Speech:    Speech is normal; fluent and spontaneous with normal comprehension.  Cognition:     Orientation to time, place and person     Normal recent and remote memory  Normal Attention span and concentration     Normal Language, naming, repeating,spontaneous speech     Fund of knowledge   CRANIAL NERVES: CN II: Visual fields are full to confrontation. Fundoscopic exam is normal with sharp discs and no vascular changes. Pupils are round equal and briskly reactive to light. CN III, IV, VI: extraocular movement are normal. No ptosis. CN V: Facial sensation is intact to pinprick in all 3 divisions bilaterally. Corneal responses are intact.  CN VII: Face is symmetric with normal eye closure and smile. CN VIII: Hearing is normal to rubbing fingers CN IX, X: Palate elevates symmetrically. Phonation is normal. CN XI: Head turning and shoulder shrug are intact CN XII: Tongue is midline with normal movements and no atrophy.  MOTOR: There is no pronator drift of out-stretched arms. Muscle bulk and tone are normal. Muscle strength is normal.  REFLEXES: Reflexes are 2+ and symmetric at the biceps, triceps, knees, and ankles. Plantar responses are flexor.  SENSORY: Intact to light touch, pinprick, positional sensation and vibratory sensation are intact in fingers and toes.  COORDINATION: Rapid alternating movements and fine finger movements  are intact. There is no dysmetria on finger-to-nose and heel-knee-shin.    GAIT/STANCE: Posture is normal. Gait is steady with normal steps, base, arm swing, and turning. Heel and toe walking are normal. Tandem gait is normal.  Romberg is absent.   DIAGNOSTIC DATA (LABS, IMAGING, TESTING) - I reviewed patient records, labs, notes, testing and imaging myself where available.   ASSESSMENT AND PLAN  Hampton Abboticole Calvillo is a 40 y.o. female   Chronic migraine headaches History of left carotid artery aneurysm, status post Starstent placement  Titrating up preventive medications nortriptyline 25 mg 2 tablets every night  I have written naproxen 500 mg Zofran as needed  She has tried and failed Imitrex tablet, Maxalt dissolvable, Relpax, Fioricet, with limited help, tramadol was help her some,  I am hesitate to try more triptan treatment due her left carotid artery aneurysm and stent placement  BOTOX injection was performed according to protocol by [employer]. 100 units of BOTOX was dissolved into 2 cc NS.   Corrugator 2 sites, 10 units Procerus 1 site, 5 unit Frontalis 4 sites,  20 units, Temporalis 8 sites,  40 units  Occipitalis 6 sites, 30 units Cervical Paraspinal, 4 sites, 20 units Trapezius, 6 sites, 30 units  Extra 45 units were placed at her upper cervical regions and skulls  Patient tolerate the injection well. Will return for repeat injection in 3 months.    Levert FeinsteinYijun Jahi Roza, M.D. Ph.D.  [facility]   [address], [address] [place] [address] Ph: [phone] Fax: [phone]  CC:Donato SchultzYvonne R Lowne Chase, DO

## 2016-09-04 ENCOUNTER — Ambulatory Visit: Payer: Managed Care, Other (non HMO) | Admitting: Gynecology

## 2016-09-04 ENCOUNTER — Other Ambulatory Visit: Payer: Managed Care, Other (non HMO)

## 2016-09-25 ENCOUNTER — Ambulatory Visit: Payer: Managed Care, Other (non HMO) | Admitting: Neurology

## 2016-09-26 ENCOUNTER — Ambulatory Visit: Payer: Managed Care, Other (non HMO) | Admitting: Family Medicine

## 2016-10-21 ENCOUNTER — Telehealth: Payer: Self-pay | Admitting: Neurology

## 2016-10-21 NOTE — Telephone Encounter (Signed)
Called patient to inquire about new insurance, she did not answer so I left a VM asking for her to call me back.

## 2016-10-21 NOTE — Telephone Encounter (Signed)
New [facility] ID: [id number].  Provider phone number [phone].

## 2016-10-22 ENCOUNTER — Emergency Department (HOSPITAL_BASED_OUTPATIENT_CLINIC_OR_DEPARTMENT_OTHER)
Admission: EM | Admit: 2016-10-22 | Discharge: 2016-10-22 | Disposition: A | Discharge status: Home / Self Care | Payer: Commercial Managed Care - PPO | Attending: Emergency Medicine | Admitting: Emergency Medicine

## 2016-10-22 ENCOUNTER — Encounter (HOSPITAL_BASED_OUTPATIENT_CLINIC_OR_DEPARTMENT_OTHER): Payer: Self-pay

## 2016-10-22 ENCOUNTER — Emergency Department (HOSPITAL_BASED_OUTPATIENT_CLINIC_OR_DEPARTMENT_OTHER): Payer: Commercial Managed Care - PPO

## 2016-10-22 DIAGNOSIS — Z79899 Other long term (current) drug therapy: Secondary | ICD-10-CM | POA: Insufficient documentation

## 2016-10-22 DIAGNOSIS — R001 Bradycardia, unspecified: Secondary | ICD-10-CM | POA: Diagnosis present

## 2016-10-22 DIAGNOSIS — R42 Dizziness and giddiness: Secondary | ICD-10-CM | POA: Diagnosis not present

## 2016-10-22 DIAGNOSIS — R5383 Other fatigue: Secondary | ICD-10-CM | POA: Diagnosis not present

## 2016-10-22 DIAGNOSIS — I10 Essential (primary) hypertension: Secondary | ICD-10-CM | POA: Diagnosis not present

## 2016-10-22 LAB — COMPREHENSIVE METABOLIC PANEL
ALT: 29 U/L (ref 14–54)
AST: 30 U/L (ref 15–41)
Albumin: 4.1 g/dL (ref 3.5–5.0)
Alkaline Phosphatase: 65 U/L (ref 38–126)
Anion gap: 10 (ref 5–15)
BUN: 15 mg/dL (ref 6–20)
CO2: 25 mmol/L (ref 22–32)
Calcium: 9.7 mg/dL (ref 8.9–10.3)
Chloride: 100 mmol/L — ABNORMAL LOW (ref 101–111)
Creatinine, Ser: 0.71 mg/dL (ref 0.44–1.00)
GFR calc Af Amer: >60 mL/min (ref >60)
GFR calc non Af Amer: >60 mL/min (ref >60)
Glucose, Bld: 104 mg/dL — ABNORMAL HIGH (ref 65–99)
Potassium: 3.6 mmol/L (ref 3.5–5.1)
Sodium: 135 mmol/L (ref 135–145)
Total Bilirubin: 0.9 mg/dL (ref 0.3–1.2)
Total Protein: 7.5 g/dL (ref 6.5–8.1)

## 2016-10-22 LAB — CBC WITH DIFFERENTIAL/PLATELET
Basophils Absolute: 0 10*3/uL (ref 0.0–0.1)
Basophils Relative: 0 %
Eosinophils Absolute: 0 10*3/uL (ref 0.0–0.7)
Eosinophils Relative: 1 %
HCT: 40.4 % (ref 36.0–46.0)
Hemoglobin: 13.9 g/dL (ref 12.0–15.0)
Lymphocytes Relative: 24 %
Lymphs Abs: 2 10*3/uL (ref 0.7–4.0)
MCH: 31.3 pg (ref 26.0–34.0)
MCHC: 34.4 g/dL (ref 30.0–36.0)
MCV: 91 fL (ref 78.0–100.0)
Monocytes Absolute: 0.5 10*3/uL (ref 0.1–1.0)
Monocytes Relative: 6 %
Neutro Abs: 5.6 10*3/uL (ref 1.7–7.7)
Neutrophils Relative %: 69 %
Platelets: 341 10*3/uL (ref 150–400)
RBC: 4.44 MIL/uL (ref 3.87–5.11)
RDW: 11.7 % (ref 11.5–15.5)
WBC: 8.1 10*3/uL (ref 4.0–10.5)

## 2016-10-22 LAB — MAGNESIUM: Magnesium: 1.7 mg/dL (ref 1.7–2.4)

## 2016-10-22 MED ORDER — SODIUM CHLORIDE 0.9 % IV BOLUS (SEPSIS)
1000.0000 mL | Freq: Once | INTRAVENOUS | Status: AC
  Administered 2016-10-22: 1000 mL via INTRAVENOUS

## 2016-10-22 NOTE — ED Notes (Signed)
Patient states that when she stands she she feels "different" - denies any dizziness

## 2016-10-22 NOTE — ED Notes (Signed)
ED Provider at bedside. 

## 2016-10-22 NOTE — ED Triage Notes (Addendum)
Pt states she has hx of dehydration when her heart rate drops and her BP will be too high or too low-s/s started x 3 days-c/o HA-denies n/v/d-NAD-steady gait

## 2016-10-22 NOTE — ED Provider Notes (Signed)
[facility] Provider Note   CSN: [id number] Arrival date & time: 10/22/16  1206     History   Chief Complaint Chief Complaint  Patient presents with  . Dehydration  . Bradycardia    HPI Jessica Molina is a 41 y.o. female.  HPI   Presents with concern for low heart rate, dizziness, fatigue This hasn't happened in 1-2 years, in year it happened a lot would have low blood pressure and low heart rate, happened at least 5 times that year and they were never sure why per pt.  Sometimes would stay in the hospital, sometimes fluids and go home.  Pt reports it seemed to happen when she was dehydrated. Has hx of carotid surgery, fibromuscular dysplasia.  Pt works out a lot every day and gets dehydrated fast. Tired because heart rate was low and slept all day. Seeing nephrologist for renovascular hypertension.  Has seen Dr. Annia Friendly in [place], [facility] for these episodes of bradycardia the past.  HR normally in 70s, but had episodes years ago where it would go into the 30s, 40s.  Patient reports taking clonidine for BP on Saturday--took .2mg  twice on Saturday. No further doses. No new medications, no new supplements. Worked out really hard on Saturday and since then has been fatigued, dehydrated.  Records summarized from Dr. Edsel Petrin in Nephrology notes discuss multiple visits for hypotension, bradycardia, some with admission, some not. One reported to be secondary to clonidine.    [facility] seen there and told to come here HR and blood pressure drop together usually  .2mg  clonidine on Saturday, took it twice  Past Medical History:  Diagnosis Date  . 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 Anxiety   . Hypertension   . Migraine   . PONV (postoperative nausea and vomiting)    hard to wake up after appendectomy  . Renal vein stenosis    "Kinked"    Patient Active Problem List   Diagnosis  Date Noted  . Menorrhagia 06/25/2016  . Chronic migraine 04/09/2016  . Chronic migraine without aura without status migrainosus, not intractable 12/19/2015  . Migraine, chronic, without aura 12/19/2015  . HTN, goal below 130/80 05/23/2015  . 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 Cephalalgia 05/18/2015  . Chronic daily headache 05/18/2015    Past Surgical History:  Procedure Laterality Date  . ABDOMINAL HYSTERECTOMY    . ANEURYSM SURGERY  01/2015  . APPENDECTOMY    . BTL  2006  . CESAREAN SECTION     Twins,  BTL  . CYSTOSCOPY N/A 06/25/2016   Procedure: CYSTOSCOPY;  Surgeon: Dara Lords, MD;  Location: [facility];  Service: Gynecology;  Laterality: N/A;  . LAPAROSCOPIC VAGINAL HYSTERECTOMY WITH SALPINGECTOMY Bilateral 06/25/2016   Procedure: LAPAROSCOPIC ASSISTED VAGINAL HYSTERECTOMY WITH Bilateral SALPINGECTOMY, Lysis of Adhesions;  Surgeon: Dara Lords, MD;  Location: [facility];  Service: Gynecology;  Laterality: Bilateral;  . RENAL ANGIOPLASTY      OB History    Gravida Para Term Preterm AB Living   4       0 4   SAB TAB Ectopic Multiple Live Births       0           Home Medications    Prior to Admission medications   Medication Sig Start Date End Date Taking? Authorizing Provider  hydrochlorothiazide (HYDRODIURIL) 25 MG tablet Take 25 mg by mouth daily.  Historical Provider, MD  losartan (COZAAR) 100 MG tablet Take 100 mg by mouth daily. Reported on 11/07/2015    Historical Provider, MD  Multiple Vitamin (MULTIVITAMIN WITH MINERALS) TABS tablet Take 1 tablet by mouth daily.    Historical Provider, MD  naproxen (NAPROSYN) 500 MG tablet Take 1 tablet (500 mg total) by mouth every 12 (twelve) hours as needed. 08/28/16   Levert Feinstein, MD  ondansetron (ZOFRAN ODT) 4 MG disintegrating tablet Take 1 tablet (4 mg total) by mouth every 8 (eight) hours as needed. 08/28/16   Levert Feinstein, MD  Riboflavin (B2) 100 MG TABS Take 1 tablet by mouth every morning.    Historical  Provider, MD  traMADol (ULTRAM) 50 MG tablet Take 1 tablet (50 mg total) by mouth every 6 (six) hours as needed. 08/28/16   Levert Feinstein, MD  zolpidem (AMBIEN CR) 12.5 MG CR tablet TAKE 1 TABLET AT BEDTIME 08/02/16   Donato Schultz, DO    Family History Family History  Problem Relation Age of Onset  . Breast cancer Mother     breast   . Thyroid disease Mother   . Cancer Mother 53    breast  . Hyperlipidemia Mother   . Hypertension Father   . Stroke Father   . Melanoma Father     melanoma   . Cancer Father     melanoma  . Breast cancer Maternal Grandmother     breast   . Thyroid disease Maternal Grandmother   . Cancer Maternal Grandfather     unknown   . Cancer Paternal Grandmother     unknow     Social History Social History  Substance Use Topics  . Smoking status: Never Smoker  . Smokeless tobacco: Never Used  . Alcohol use 0.0 oz/week     Comment: 2-3 drinks per week     Allergies   Lisinopril and Metoprolol   Review of Systems Review of Systems  Constitutional: Positive for fatigue. Negative for fever.  HENT: Negative for sore throat (dry throat).   Eyes: Negative for visual disturbance.  Respiratory: Negative for cough (1 wk ago) and shortness of breath.   Cardiovascular: Negative for chest pain.  Gastrointestinal: Negative for abdominal pain, constipation, diarrhea, nausea and vomiting.  Genitourinary: Negative for difficulty urinating, vaginal bleeding and vaginal discharge.  Musculoskeletal: Negative for back pain and neck pain.  Skin: Negative for rash.  Neurological: Positive for light-headedness. Negative for syncope and headaches.     Physical Exam Updated Vital Signs BP 116/82   Pulse (!) 44   Temp 97.9 F (36.6 C) (Oral)   Resp 14   Ht 5\' 3"  (1.6 m)   Wt 145 lb (65.8 kg)   LMP 06/19/2016 (Exact Date)   SpO2 100%   BMI 25.69 kg/m   Physical Exam  Constitutional: She is oriented to person, place, and time. She appears  well-developed and well-nourished. No distress.  HENT:  Head: Normocephalic and atraumatic.  Eyes: Conjunctivae and EOM are normal.  Neck: Normal range of motion.  Cardiovascular: Regular rhythm, normal heart sounds and intact distal pulses.  Bradycardia present.  Exam reveals no gallop and no friction rub.   No murmur heard. Pulmonary/Chest: Effort normal and breath sounds normal. No respiratory distress. She has no wheezes. She has no rales.  Abdominal: Soft. She exhibits no distension. There is no tenderness. There is no guarding.  Musculoskeletal: She exhibits no edema or tenderness.  Neurological: She is alert and oriented to person,  place, and time.  Skin: Skin is warm and dry. No rash noted. She is not diaphoretic. No erythema.  Nursing note and vitals reviewed.    ED Treatments / Results  Labs (all labs ordered are listed, but only abnormal results are displayed) Labs Reviewed  COMPREHENSIVE METABOLIC PANEL - Abnormal; Notable for the following:       Result Value   Chloride 100 (*)    Glucose, Bld 104 (*)    All other components within normal limits  CBC WITH DIFFERENTIAL/PLATELET  MAGNESIUM    EKG  EKG Interpretation  Date/Time:  Tuesday October 22 2016 12:43:34 EST Ventricular Rate:  41 PR Interval:    QRS Duration: 95 QT Interval:  505 QTC Calculation: 417 R Axis:   58 Text Interpretation:  Sinus bradycardia Since prior ECG, rate has slowed Confirmed by [facility] MD, Vanesa Renier ([id number]) on 10/22/2016 3:32:02 PM       Radiology Dg Chest Portable 1 View  Result Date: 10/22/2016 CLINICAL DATA:  Cough, bradycardia, and dehydration for the past 3 days. The patient also reports headache. Nonsmoker. EXAM: PORTABLE CHEST 1 VIEW COMPARISON:  Portable chest x-ray of May 29, 2016 FINDINGS: The lungs are borderline hypoinflated which accentuates the interstitial markings bilaterally. There is no alveolar infiltrate. There is no pleural effusion. The heart and pulmonary  vascularity are normal. The trachea is midline. The mediastinum is normal in width. The observed bony thorax is unremarkable. IMPRESSION: No alveolar pneumonia, CHF, nor other acute cardiopulmonary abnormality. Electronically Signed   By: David  SwazilandJordan M.D.   On: 10/22/2016 13:23    Procedures Procedures (including critical care time)  Medications Ordered in ED Medications  sodium chloride 0.9 % bolus 1,000 mL (0 mLs Intravenous Stopped 10/22/16 1347)  sodium chloride 0.9 % bolus 1,000 mL (0 mLs Intravenous Stopped 10/22/16 1504)     Initial Impression / Assessment and Plan / ED Course  I have reviewed the triage vital signs and the nursing notes.  Pertinent labs & imaging results that were available during my care of the patient were reviewed by me and considered in my medical decision making (see chart for details).     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 Adela GlimpseBernard, renal artery stenosis with fibromuscular dysplasia s/p renal artery angioplasty followed by [facility], prior episodes of bradycardia/hypotension presents with concern for fatigue, lightheadedness, bradycardia.  EKG shows sinus bradycardia. Patient without chest pain, doubt ACS.  Patient with sinus bradycardia in the ED, maintaining normal blood pressures.  Pt reported symptoms in past brought on by dehydration, and was given IV fluids with improvement in lightheadedness.  Pt reports taking clonidine 3 days ago (.2mg  x2) prior to symptoms beginning and feel this likely contributes to initial feeling of fatigue, however would expect improvement if she has not taken this since Saturday. Possible this triggered pt underlying bradycardic episodes. Suspect some sort of increased vagal tone/dysautonomia. Pt denies other ingestions. Has HA but hx of similar, doubt acute bleed.  Discussed possibility  of headache treatment however pt reports she will take home medications.   Pt given 2L of NS, blood pressures stable, HR primarily in upper 40s after period of observation and in 50s when I am in room.  She has a history of similar episodes of bradycardia for which she has been seen in past, has not required pressors/pacing which improved. Feel her  symptomatic sinus bradycardia is improving and she is stable for outpatient follow up with strict return precautions. She has no acute renal failure, no significant electrolyte abnormalities.  She was able to ambulate independently in the department, stands without lightheadedness. Patient discharged in stable condition with understanding of reasons to return.      Final Clinical Impressions(s) / ED Diagnoses   Final diagnoses:  Bradycardia    New Prescriptions Discharge Medication List as of 10/22/2016  5:10 PM       Alvira Monday, MD 10/22/16 2051

## 2016-11-06 ENCOUNTER — Ambulatory Visit (INDEPENDENT_AMBULATORY_CARE_PROVIDER_SITE_OTHER): Payer: Commercial Managed Care - PPO | Admitting: Neurology

## 2016-11-06 ENCOUNTER — Encounter: Payer: Self-pay | Admitting: Neurology

## 2016-11-06 VITALS — BP 117/77 | HR 76 | Ht 63.0 in | Wt 148.0 lb

## 2016-11-06 DIAGNOSIS — G43709 Chronic migraine without aura, not intractable, without status migrainosus: Secondary | ICD-10-CM

## 2016-11-06 MED ORDER — ZOLMITRIPTAN 5 MG PO TABS: 5.0000 mg | ORAL_TABLET | ORAL | 6 refills | Status: DC | PRN

## 2016-11-06 NOTE — Progress Notes (Signed)
**  Botox 100 units x 2 vials, NDC [id number], Lot [id number], exp 03/2019, specialty pharmacy.//mck,rn**

## 2016-11-06 NOTE — Progress Notes (Signed)
PATIENT: Jessica Molina DOB: 1976/05/16  Chief Complaint  Patient presents with  . Migraine    Botox 200 units - specialty pharmacy     HISTORICAL  Jessica Molina is a 41 years old right-handed female, seen in refer by her primary care doctor Donato Schultz for evaluation of frequent migraine headache, also carried a diagnosis of pseudoaneurysm of carotid artery.  In February 2016, she developed significant blood pressure variation, with associated strokelike symptoms, slurred speech, she was evaluated at [facility], was diagnosed with atypical left carotid artery aneurysm, had star close device in May 2016 by neurosurgeon Dr. Adela Glimpse at [facility], has been followed up regularly, which showed no recurrent aneurysm. She had significant elevated blood pressure since 2013, was found to have bilateral renal artery stenosis, was diagnosed with fibromuscular dysplasia, had bilateral renal artery angioplasty 3 times in the past, which only helped her blood pressure control temporarily, she continue have significant blood pressure variations, has been followed up by nephrologist at [facility].  She reported migraine headaches since 2015, sometimes preceding by visual aura, lasting for a few hours, followed by lateralized severe pounding headache with light noise sensitivity, nauseous, her headache last about one day, since February 2017, she is having headache 2-3 times each week, she has tried tramadol without significant help.  Previously she tried Topamax, complains of numbness tingling of her hands, without helping her headache much, antidepression in the past, cause worsening mood swing,  Trigger for her migraines are weather change, stress, sleep deprivation, she work on night shift 7 PM to 7 AM 2 days a week  She is now taking frequent over counter medication, Aleve Tylenol with limited help  UPDATE May 22 2016: She is with her daughter at today's clinical visit, she  continued to have 3-4 headaches each week, vortex area severe pounding headache with associated light noise sensitivity, it can last for 2 days, she has tried Imitrex, Maxalt without helping her headaches, sleep usually help,  For preventive medications she has tried Topamax, without help, currently taking nortriptyline 20 mg every night, no significant side effect noticed.  We have personally reviewed MRI of the brain in August 2017, nonspecific white matter small vessel disease, MRA of the brain was normal  UPDATE Nov 29th 2017: This is her first Botox injection as migraine prevention, related question was answered, she still has migraine headache 3 times each week, lasting for 1- 2days, has tried Imitrex tablets, Maxalt dissolvable, Relpax, with limited help, insurance does not cover cambia, Fioricet, she is now taking tramadol as needed.  UPDATE Feb 7th 2018: Her headache has much improved with her first Botox injection on August 28 2016, at the peak of the benefit, she has average 1 show less headache each week,  She is taking tramadol, Zofran as needed, which helps her headaches some, but rarely took away her headaches.  REVIEW OF SYSTEMS: Full 14 system review of systems performed and notable only for as above  ALLERGIES: Allergies  Allergen Reactions  . Lisinopril Cough  . Metoprolol Other (See Comments)    did not feel well when taking     HOME MEDICATIONS: Current Outpatient Prescriptions  Medication Sig Dispense Refill  . BOTOX 100 units SOLR injection 200 Units every 3 (three) months.    . hydrochlorothiazide (HYDRODIURIL) 25 MG tablet Take 25 mg by mouth daily.    Marland Kitchen losartan (COZAAR) 100 MG tablet Take 100 mg by mouth daily. Reported on 11/07/2015    .  Multiple Vitamin (MULTIVITAMIN WITH MINERALS) TABS tablet Take 1 tablet by mouth daily.    . naproxen (NAPROSYN) 500 MG tablet Take 1 tablet (500 mg total) by mouth every 12 (twelve) hours as needed. 60 tablet 6  .  ondansetron (ZOFRAN ODT) 4 MG disintegrating tablet Take 1 tablet (4 mg total) by mouth every 8 (eight) hours as needed. 30 tablet 11  . Riboflavin (B2) 100 MG TABS Take 1 tablet by mouth every morning.    . traMADol (ULTRAM) 50 MG tablet Take 1 tablet (50 mg total) by mouth every 6 (six) hours as needed. 30 tablet 5  . zolpidem (AMBIEN CR) 12.5 MG CR tablet TAKE 1 TABLET AT BEDTIME 30 tablet 2   No current facility-administered medications for this visit.     PAST MEDICAL HISTORY: Past Medical History:  Diagnosis Date  . 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 Anxiety   . Hypertension   . Migraine   . PONV (postoperative nausea and vomiting)    hard to wake up after appendectomy  . Renal vein stenosis    "Kinked"    PAST SURGICAL HISTORY: Past Surgical History:  Procedure Laterality Date  . ABDOMINAL HYSTERECTOMY    . ANEURYSM SURGERY  01/2015  . APPENDECTOMY    . BTL  2006  . CESAREAN SECTION     Twins,  BTL  . CYSTOSCOPY N/A 06/25/2016   Procedure: CYSTOSCOPY;  Surgeon: Dara Lords, MD;  Location: [facility];  Service: Gynecology;  Laterality: N/A;  . LAPAROSCOPIC VAGINAL HYSTERECTOMY WITH SALPINGECTOMY Bilateral 06/25/2016   Procedure: LAPAROSCOPIC ASSISTED VAGINAL HYSTERECTOMY WITH Bilateral SALPINGECTOMY, Lysis of Adhesions;  Surgeon: Dara Lords, MD;  Location: [facility];  Service: Gynecology;  Laterality: Bilateral;  . RENAL ANGIOPLASTY      FAMILY HISTORY: Family History  Problem Relation Age of Onset  . Breast cancer Mother     breast   . Thyroid disease Mother   . Cancer Mother 24    breast  . Hyperlipidemia Mother   . Hypertension Father   . Stroke Father   . Melanoma Father     melanoma   . Cancer Father     melanoma  . Breast cancer Maternal Grandmother     breast   . Thyroid disease Maternal Grandmother   . Cancer Maternal Grandfather     unknown   . Cancer Paternal Grandmother      unknow     SOCIAL HISTORY:  Social History   Social History  . Marital status: Married    Spouse name: N/A  . Number of children: 4  . Years of education: HS+   Occupational History  . Nurse Tech    Social History Main Topics  . Smoking status: Never Smoker  . Smokeless tobacco: Never Used  . Alcohol use 0.0 oz/week     Comment: 2-3 drinks per week  . Drug use: No  . Sexual activity: Yes    Partners: Male    Birth control/ protection: Surgical   Other Topics Concern  . Not on file   Social History Narrative   Lives at home with husband and children.   Right-handed.   2-3 cups caffeine per week.     PHYSICAL EXAM   Vitals:   11/06/16 1511  BP: 117/77  Pulse: 76  Weight: 148 lb (67.1 kg)  Height: 5\' 3"  (1.6 m)    Not recorded  Body mass index is 26.22 kg/m.  PHYSICAL EXAMNIATION:  Gen: NAD, conversant, well nourised, obese, well groomed                     Cardiovascular: Regular rate rhythm, no peripheral edema, warm, nontender. Eyes: Conjunctivae clear without exudates or hemorrhage Neck: Supple, no carotid bruise. Pulmonary: Clear to auscultation bilaterally   NEUROLOGICAL EXAM:  MENTAL STATUS: Speech:    Speech is normal; fluent and spontaneous with normal comprehension.  Cognition:     Orientation to time, place and person     Normal recent and remote memory     Normal Attention span and concentration     Normal Language, naming, repeating,spontaneous speech     Fund of knowledge   CRANIAL NERVES: CN II: Visual fields are full to confrontation. Fundoscopic exam is normal with sharp discs and no vascular changes. Pupils are round equal and briskly reactive to light. CN III, IV, VI: extraocular movement are normal. No ptosis. CN V: Facial sensation is intact to pinprick in all 3 divisions bilaterally. Corneal responses are intact.  CN VII: Face is symmetric with normal eye closure and smile. CN VIII: Hearing is normal to rubbing  fingers CN IX, X: Palate elevates symmetrically. Phonation is normal. CN XI: Head turning and shoulder shrug are intact CN XII: Tongue is midline with normal movements and no atrophy.  MOTOR: There is no pronator drift of out-stretched arms. Muscle bulk and tone are normal. Muscle strength is normal.  REFLEXES: Reflexes are 2+ and symmetric at the biceps, triceps, knees, and ankles. Plantar responses are flexor.  SENSORY: Intact to light touch, pinprick, positional sensation and vibratory sensation are intact in fingers and toes.  COORDINATION: Rapid alternating movements and fine finger movements are intact. There is no dysmetria on finger-to-nose and heel-knee-shin.    GAIT/STANCE: Posture is normal. Gait is steady with normal steps, base, arm swing, and turning. Heel and toe walking are normal. Tandem gait is normal.  Romberg is absent.   DIAGNOSTIC DATA (LABS, IMAGING, TESTING) - I reviewed patient records, labs, notes, testing and imaging myself where available.   ASSESSMENT AND PLAN  Jessica Molina is a 41 y.o. female   Chronic migraine headaches History of left carotid artery aneurysm, status post Starstent placement  Titrating up preventive medications nortriptyline 25 mg 2 tablets every night  I have written naproxen 500 mg Zofran as needed  She has tried and failed Imitrex tablet, Maxalt dissolvable, Relpax, Fioricet, with limited help, tramadol was help her some,  I am hesitate to try more triptan treatment due her left carotid artery aneurysm and stent placement  BOTOX injection was performed according to protocol by [employer]. 100 units of BOTOX was dissolved into 2 cc NS.   Corrugator 2 sites, 10 units Procerus 1 site, 5 unit Frontalis 4 sites,  20 units, Temporalis 8 sites,  40 units  Occipitalis 6 sites, 30 units Cervical Paraspinal, 4 sites, 20 units Trapezius, 6 sites, 30 units  Extra 45 units were placed at her left upper cervical in the left  temporoparietal region  I have advised her to take Zomig 5 mg together with Aleve 2 tablets, Benadryl 25 mg, Zofran 4 mg as needed at the beginning of severe migraine headaches, only use tramadol if the initial abortive treatment does not work well for her   Will return for repeat injection in 3 months.    Levert FeinsteinYijun Dennys Traughber, M.D. Ph.D.  [facility]   [phone] [address]  [address] [place], [place] [address] Ph: [phone] Fax: [phone]  CC:Donato Schultz, DO

## 2016-11-06 NOTE — Patient Instructions (Signed)
You may take Aleve 2 tablets together with Zomig 5 mg, Benadryl only 5 mg, Zofran 4 mg during the onset of the severe migraine headaches,  Only for those combination does not work, you may try tramadol,

## 2016-11-07 ENCOUNTER — Ambulatory Visit (INDEPENDENT_AMBULATORY_CARE_PROVIDER_SITE_OTHER): Payer: Commercial Managed Care - PPO | Admitting: Women's Health

## 2016-11-07 ENCOUNTER — Encounter: Payer: Self-pay | Admitting: Women's Health

## 2016-11-07 ENCOUNTER — Telehealth: Payer: Self-pay | Admitting: *Deleted

## 2016-11-07 VITALS — BP 120/78 | Ht 63.0 in | Wt 146.0 lb

## 2016-11-07 DIAGNOSIS — Z01419 Encounter for gynecological examination (general) (routine) without abnormal findings: Secondary | ICD-10-CM

## 2016-11-07 DIAGNOSIS — Z13220 Encounter for screening for lipoid disorders: Secondary | ICD-10-CM

## 2016-11-07 DIAGNOSIS — Z833 Family history of diabetes mellitus: Secondary | ICD-10-CM | POA: Diagnosis not present

## 2016-11-07 DIAGNOSIS — Z803 Family history of malignant neoplasm of breast: Secondary | ICD-10-CM

## 2016-11-07 LAB — LIPID PANEL
Cholesterol: 210 mg/dL — ABNORMAL HIGH (ref <200)
HDL: 65 mg/dL (ref >50)
LDL Cholesterol: 132 mg/dL — ABNORMAL HIGH (ref <100)
Total CHOL/HDL Ratio: 3.2 Ratio (ref <5.0)
Triglycerides: 64 mg/dL (ref <150)
VLDL: 13 mg/dL (ref <30)

## 2016-11-07 NOTE — Telephone Encounter (Signed)
-----  Message from Huel Cote, NP sent at 11/07/2016 12:49 PM EST ----- Please schedule consult to discuss BRCA testing with geneticist at [facility]. Mother initially diagnosed with breast cancer at age 42, second diagnoses about 80 years later with metastasis and did die, maternal grandmother, 2 maternal aunts also with breast cancer.

## 2016-11-07 NOTE — Progress Notes (Signed)
Jessica Molina 11/19/1975 [id number]    History:    Presents for annual exam.  05/2016 TVH with bilateral salpingectomies for menorrhagia and lysis of adhesions. 2004 LEEP with normal Paps after. Normal mammograms. Mother breast cancer initial diagnosis at age 42 second episode 20 years later with metastasis. Has several maternal aunts and grandmother with history of breast cancer. Primary care manages hypertension. Migraines has had Botox with good relief of migraines. Has had elevated glucose in the past.  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 [place]  ROS:  A ROS was performed and pertinent positives and negatives are included.  Exam:  Vitals:   11/07/16 0931  BP: 120/78  Weight: 146 lb (66.2 kg)  Height: 5\' 3"  (1.6 m)   Body mass index is 25.86 kg/m.   General appearance:  Normal Thyroid:  Symmetrical, normal in size, without palpable masses or nodularity. Respiratory  Auscultation:  Clear without wheezing or rhonchi Cardiovascular  Auscultation:  Regular rate, without rubs, murmurs or gallops  Edema/varicosities:  Not grossly evident Abdominal  Soft,nontender, without masses, guarding or rebound.  Liver/spleen:  No organomegaly noted  Hernia:  None appreciated  Skin  Inspection:  Grossly normal   Breasts: Examined lying and sitting.     Right: Without masses, retractions, discharge or axillary adenopathy.     Left: Without masses, retractions, discharge or axillary adenopathy. Gentitourinary   Inguinal/mons:  Normal without inguinal adenopathy  External genitalia:  Normal  BUS/Urethra/Skene's glands:  Normal  Vagina:  Normal  Cervix:  And uterus absent Adnexa/parametria:     Rt: Without masses or tenderness.   Lt: Without masses or tenderness.  Anus and perineum: Normal  Digital rectal exam: Normal  sphincter tone without palpated masses or tenderness  Assessment/Plan:  40 y.o.MWF G3P4  for annual exam.    05/2016 TVH with bilateral salpingectomies for menorrhagia and lysis of adhesions Hypertension-primary care manages Migraine headaches-Botox treatment per neurologist Mother, maternal grandmother, maternal aunts-breast cancer history  Plan: Genetic counseling reviewed, will send referral. SBE's, continue annual 3-D screening mammogram, calcium rich diet, vitamin D 2000 daily encouraged. Continue regular exercise. Hemoglobin A1c, lipid panel.   Harrington ChallengerYOUNG,NANCY J [facility] LLCWHNP, 12:42 PM 11/07/2016

## 2016-11-07 NOTE — Patient Instructions (Signed)
Carbohydrate Counting for Diabetes Mellitus, Adult Carbohydrate counting is a method for keeping track of how many carbohydrates you eat. Eating carbohydrates naturally increases the amount of sugar (glucose) in the blood. Counting how many carbohydrates you eat helps keep your blood glucose within normal limits, which helps you manage your diabetes (diabetes mellitus). It is important to know how many carbohydrates you can safely have in each meal. This is different for every person. A diet and nutrition specialist (registered dietitian) can help you make a meal plan and calculate how many carbohydrates you should have at each meal and snack. Carbohydrates are found in the following foods:  Grains, such as breads and cereals.  Dried beans and soy products.  Starchy vegetables, such as potatoes, peas, and corn.  Fruit and fruit juices.  Milk and yogurt.  Sweets and snack foods, such as cake, cookies, candy, chips, and soft drinks. How do I count carbohydrates? There are two ways to count carbohydrates in food. You can use either of the methods or a combination of both. Reading "Nutrition Facts" on packaged food  The "Nutrition Facts" list is included on the labels of almost all packaged foods and beverages in the [place] It includes:  The serving size.  Information about nutrients in each serving, including the grams (g) of carbohydrate per serving. To use the "Nutrition Facts":  Decide how many servings you will have.  Multiply the number of servings by the number of carbohydrates per serving.  The resulting number is the total amount of carbohydrates that you will be having. Learning standard serving sizes of other foods  When you eat foods containing carbohydrates that are not packaged or do not include "Nutrition Facts" on the label, you need to measure the servings in order to count the amount of carbohydrates:  Measure the foods that you will eat with a food scale or measuring  cup, if needed.  Decide how many standard-size servings you will eat.  Multiply the number of servings by 15. Most carbohydrate-rich foods have about 15 g of carbohydrates per serving.  For example, if you eat 8 oz (170 g) of strawberries, you will have eaten 2 servings and 30 g of carbohydrates (2 servings x 15 g = 30 g).  For foods that have more than one food mixed, such as soups and casseroles, you must count the carbohydrates in each food that is included. The following list contains standard serving sizes of common carbohydrate-rich foods. Each of these servings has about 15 g of carbohydrates:   hamburger bun or  English muffin.   oz (15 mL) syrup.   oz (14 g) jelly.  1 slice of bread.  1 six-inch tortilla.  3 oz (85 g) cooked rice or pasta.  4 oz (113 g) cooked dried beans.  4 oz (113 g) starchy vegetable, such as peas, corn, or potatoes.  4 oz (113 g) hot cereal.  4 oz (113 g) mashed potatoes or  of a large baked potato.  4 oz (113 g) canned or frozen fruit.  4 oz (120 mL) fruit juice.  4-6 crackers.  6 chicken nuggets.  6 oz (170 g) unsweetened dry cereal.  6 oz (170 g) plain fat-free yogurt or yogurt sweetened with artificial sweeteners.  8 oz (240 mL) milk.  8 oz (170 g) fresh fruit or one small piece of fruit.  24 oz (680 g) popped popcorn. Example of carbohydrate counting Sample meal  3 oz (85 g) chicken breast.  6 oz (  170 g) brown rice.  4 oz (113 g) corn.  8 oz (240 mL) milk.  8 oz (170 g) strawberries with sugar-free whipped topping. Carbohydrate calculation 1. Identify the foods that contain carbohydrates:  Rice.  Corn.  Milk.  Strawberries. 2. Calculate how many servings you have of each food:  2 servings rice.  1 serving corn.  1 serving milk.  1 serving strawberries. 3. Multiply each number of servings by 15 g:  2 servings rice x 15 g = 30 g.  1 serving corn x 15 g = 15 g.  1 serving milk x 15 g = 15  g.  1 serving strawberries x 15 g = 15 g. 4. Add together all of the amounts to find the total grams of carbohydrates eaten:  30 g + 15 g + 15 g + 15 g = 75 g of carbohydrates total. This information is not intended to replace advice given to you by your health care provider. Make sure you discuss any questions you have with your health care provider. Document Released: 09/16/2005 Document Revised: 04/05/2016 Document Reviewed: 02/28/2016 Elsevier Interactive Patient Education  2017 [employer] Health Maintenance, Female Introduction Adopting a healthy lifestyle and getting preventive care can go a long way to promote health and wellness. Talk with your health care provider about what schedule of regular examinations is right for you. This is a good chance for you to check in with your provider about disease prevention and staying healthy. In between checkups, there are plenty of things you can do on your own. Experts have done a lot of research about which lifestyle changes and preventive measures are most likely to keep you healthy. Ask your health care provider for more information. Weight and diet Eat a healthy diet  Be sure to include plenty of vegetables, fruits, low-fat dairy products, and lean protein.  Do not eat a lot of foods high in solid fats, added sugars, or salt.  Get regular exercise. This is one of the most important things you can do for your health.  Most adults should exercise for at least 150 minutes each week. The exercise should increase your heart rate and make you sweat (moderate-intensity exercise).  Most adults should also do strengthening exercises at least twice a week. This is in addition to the moderate-intensity exercise. Maintain a healthy weight  Body mass index (BMI) is a measurement that can be used to identify possible weight problems. It estimates body fat based on height and weight. Your health care provider can help determine your BMI and help  you achieve or maintain a healthy weight.  For females 20 years of age and older:  A BMI below 18.5 is considered underweight.  A BMI of 18.5 to 24.9 is normal.  A BMI of 25 to 29.9 is considered overweight.  A BMI of 30 and above is considered obese. Watch levels of cholesterol and blood lipids  You should start having your blood tested for lipids and cholesterol at 42 years of age, then have this test every 5 years.  You may need to have your cholesterol levels checked more often if:  Your lipid or cholesterol levels are high.  You are older than 42 years of age.  You are at high risk for heart disease. Cancer screening Lung Cancer  Lung cancer screening is recommended for adults 55-80 years old who are at high risk for lung cancer because of a history of smoking.  A yearly low-dose CT scan   of the lungs is recommended for people who:  Currently smoke.  Have quit within the past 15 years.  Have at least a 30-pack-year history of smoking. A pack year is smoking an average of one pack of cigarettes a day for 1 year.  Yearly screening should continue until it has been 15 years since you quit.  Yearly screening should stop if you develop a health problem that would prevent you from having lung cancer treatment. Breast Cancer  Practice breast self-awareness. This means understanding how your breasts normally appear and feel.  It also means doing regular breast self-exams. Let your health care provider know about any changes, no matter how small.  If you are in your 20s or 30s, you should have a clinical breast exam (CBE) by a health care provider every 1-3 years as part of a regular health exam.  If you are 40 or older, have a CBE every year. Also consider having a breast X-ray (mammogram) every year.  If you have a family history of breast cancer, talk to your health care provider about genetic screening.  If you are at high risk for breast cancer, talk to your health  care provider about having an MRI and a mammogram every year.  Breast cancer gene (BRCA) assessment is recommended for women who have family members with BRCA-related cancers. BRCA-related cancers include:  Breast.  Ovarian.  Tubal.  Peritoneal cancers.  Results of the assessment will determine the need for genetic counseling and BRCA1 and BRCA2 testing. Cervical Cancer  Your health care provider may recommend that you be screened regularly for cancer of the pelvic organs (ovaries, uterus, and vagina). This screening involves a pelvic examination, including checking for microscopic changes to the surface of your cervix (Pap test). You may be encouraged to have this screening done every 3 years, beginning at age 21.  For women ages 30-65, health care providers may recommend pelvic exams and Pap testing every 3 years, or they may recommend the Pap and pelvic exam, combined with testing for human papilloma virus (HPV), every 5 years. Some types of HPV increase your risk of cervical cancer. Testing for HPV may also be done on women of any age with unclear Pap test results.  Other health care providers may not recommend any screening for nonpregnant women who are considered low risk for pelvic cancer and who do not have symptoms. Ask your health care provider if a screening pelvic exam is right for you.  If you have had past treatment for cervical cancer or a condition that could lead to cancer, you need Pap tests and screening for cancer for at least 20 years after your treatment. If Pap tests have been discontinued, your risk factors (such as having a new sexual partner) need to be reassessed to determine if screening should resume. Some women have medical problems that increase the chance of getting cervical cancer. In these cases, your health care provider may recommend more frequent screening and Pap tests. Colorectal Cancer  This type of cancer can be detected and often prevented.  Routine  colorectal cancer screening usually begins at 42 years of age and continues through 42 years of age.  Your health care provider may recommend screening at an earlier age if you have risk factors for colon cancer.  Your health care provider may also recommend using home test kits to check for hidden blood in the stool.  A small camera at the end of a tube can be used to   examine your colon directly (sigmoidoscopy or colonoscopy). This is done to check for the earliest forms of colorectal cancer.  Routine screening usually begins at age 50.  Direct examination of the colon should be repeated every 5-10 years through 42 years of age. However, you may need to be screened more often if early forms of precancerous polyps or small growths are found. Skin Cancer  Check your skin from head to toe regularly.  Tell your health care provider about any new moles or changes in moles, especially if there is a change in a mole's shape or color.  Also tell your health care provider if you have a mole that is larger than the size of a pencil eraser.  Always use sunscreen. Apply sunscreen liberally and repeatedly throughout the day.  Protect yourself by wearing long sleeves, pants, a wide-brimmed hat, and sunglasses whenever you are outside. Heart disease, diabetes, and high blood pressure  High blood pressure causes heart disease and increases the risk of stroke. High blood pressure is more likely to develop in:  People who have blood pressure in the high end of the normal range (130-139/85-89 mm Hg).  People who are overweight or obese.  People who are African American.  If you are 18-39 years of age, have your blood pressure checked every 3-5 years. If you are 40 years of age or older, have your blood pressure checked every year. You should have your blood pressure measured twice-once when you are at a hospital or clinic, and once when you are not at a hospital or clinic. Record the average of the two  measurements. To check your blood pressure when you are not at a hospital or clinic, you can use:  An automated blood pressure machine at a pharmacy.  A home blood pressure monitor.  If you are between 55 years and 79 years old, ask your health care provider if you should take aspirin to prevent strokes.  Have regular diabetes screenings. This involves taking a blood sample to check your fasting blood sugar level.  If you are at a normal weight and have a low risk for diabetes, have this test once every three years after 42 years of age.  If you are overweight and have a high risk for diabetes, consider being tested at a younger age or more often. Preventing infection Hepatitis B  If you have a higher risk for hepatitis B, you should be screened for this virus. You are considered at high risk for hepatitis B if:  You were born in a country where hepatitis B is common. Ask your health care provider which countries are considered high risk.  Your parents were born in a high-risk country, and you have not been immunized against hepatitis B (hepatitis B vaccine).  You have HIV or AIDS.  You use needles to inject street drugs.  You live with someone who has hepatitis B.  You have had sex with someone who has hepatitis B.  You get hemodialysis treatment.  You take certain medicines for conditions, including cancer, organ transplantation, and autoimmune conditions. Hepatitis C  Blood testing is recommended for:  Everyone born from 1945 through 1965.  Anyone with known risk factors for hepatitis C. Sexually transmitted infections (STIs)  You should be screened for sexually transmitted infections (STIs) including gonorrhea and chlamydia if:  You are sexually active and are younger than 42 years of age.  You are older than 42 years of age and your health care provider tells   you that you are at risk for this type of infection.  Your sexual activity has changed since you were last  screened and you are at an increased risk for chlamydia or gonorrhea. Ask your health care provider if you are at risk.  If you do not have HIV, but are at risk, it may be recommended that you take a prescription medicine daily to prevent HIV infection. This is called pre-exposure prophylaxis (PrEP). You are considered at risk if:  You are sexually active and do not regularly use condoms or know the HIV status of your partner(s).  You take drugs by injection.  You are sexually active with a partner who has HIV. Talk with your health care provider about whether you are at high risk of being infected with HIV. If you choose to begin PrEP, you should first be tested for HIV. You should then be tested every 3 months for as long as you are taking PrEP. Pregnancy  If you are premenopausal and you may become pregnant, ask your health care provider about preconception counseling.  If you may become pregnant, take 400 to 800 micrograms (mcg) of folic acid every day.  If you want to prevent pregnancy, talk to your health care provider about birth control (contraception). Osteoporosis and menopause  Osteoporosis is a disease in which the bones lose minerals and strength with aging. This can result in serious bone fractures. Your risk for osteoporosis can be identified using a bone density scan.  If you are 65 years of age or older, or if you are at risk for osteoporosis and fractures, ask your health care provider if you should be screened.  Ask your health care provider whether you should take a calcium or vitamin D supplement to lower your risk for osteoporosis.  Menopause may have certain physical symptoms and risks.  Hormone replacement therapy may reduce some of these symptoms and risks. Talk to your health care provider about whether hormone replacement therapy is right for you. Follow these instructions at home:  Schedule regular health, dental, and eye exams.  Stay current with your  immunizations.  Do not use any tobacco products including cigarettes, chewing tobacco, or electronic cigarettes.  If you are pregnant, do not drink alcohol.  If you are breastfeeding, limit how much and how often you drink alcohol.  Limit alcohol intake to no more than 1 drink per day for nonpregnant women. One drink equals 12 ounces of beer, 5 ounces of wine, or 1 ounces of hard liquor.  Do not use street drugs.  Do not share needles.  Ask your health care provider for help if you need support or information about quitting drugs.  Tell your health care provider if you often feel depressed.  Tell your health care provider if you have ever been abused or do not feel safe at home. This information is not intended to replace advice given to you by your health care provider. Make sure you discuss any questions you have with your health care provider. Document Released: 04/01/2011 Document Revised: 02/22/2016 Document Reviewed: 06/20/2015  2017 Elsevier  

## 2016-11-07 NOTE — Telephone Encounter (Signed)
Referral placed at [facility] they will contact pt to schedule. 

## 2016-11-08 LAB — URINALYSIS W MICROSCOPIC + REFLEX CULTURE
Bilirubin Urine: NEGATIVE
Casts: NONE SEEN [LPF]
Crystals: NONE SEEN [HPF]
Glucose, UA: NEGATIVE
Hgb urine dipstick: NEGATIVE
Ketones, ur: NEGATIVE
Leukocytes, UA: NEGATIVE
Nitrite: NEGATIVE
Protein, ur: NEGATIVE
Specific Gravity, Urine: 1.024 (ref 1.001–1.035)
WBC, UA: NONE SEEN WBC/HPF (ref <=5)
Yeast: NONE SEEN [HPF]
pH: 6 (ref 5.0–8.0)

## 2016-11-08 LAB — HEMOGLOBIN A1C
Hgb A1c MFr Bld: 5 % (ref <5.7)
Mean Plasma Glucose: 97 mg/dL

## 2016-11-09 LAB — URINE CULTURE: Organism ID, Bacteria: NO GROWTH

## 2016-11-13 ENCOUNTER — Other Ambulatory Visit: Payer: Self-pay | Admitting: Women's Health

## 2016-11-13 DIAGNOSIS — Z1231 Encounter for screening mammogram for malignant neoplasm of breast: Secondary | ICD-10-CM

## 2016-11-15 ENCOUNTER — Telehealth: Payer: Self-pay | Admitting: Genetic Counselor

## 2016-11-15 ENCOUNTER — Encounter: Payer: Self-pay | Admitting: Genetic Counselor

## 2016-11-15 NOTE — Telephone Encounter (Signed)
Pt returned my call to schedule a appt. She has been scheduled to see a genetic counselor on 2/23 at 2pm. Aware to arrive 30 minutes early. Demographics verified. Letter mailed to the pt.

## 2016-11-20 NOTE — Telephone Encounter (Signed)
Appointment 11/22/16 @ 2:00pm

## 2016-11-22 ENCOUNTER — Other Ambulatory Visit: Payer: Commercial Managed Care - PPO

## 2016-11-22 ENCOUNTER — Ambulatory Visit (HOSPITAL_BASED_OUTPATIENT_CLINIC_OR_DEPARTMENT_OTHER): Payer: Commercial Managed Care - PPO

## 2016-11-22 DIAGNOSIS — Z809 Family history of malignant neoplasm, unspecified: Secondary | ICD-10-CM | POA: Diagnosis not present

## 2016-11-22 DIAGNOSIS — Z315 Encounter for genetic counseling: Secondary | ICD-10-CM

## 2016-11-22 DIAGNOSIS — Z808 Family history of malignant neoplasm of other organs or systems: Secondary | ICD-10-CM

## 2016-11-22 DIAGNOSIS — Z803 Family history of malignant neoplasm of breast: Secondary | ICD-10-CM | POA: Diagnosis not present

## 2016-11-22 NOTE — Progress Notes (Signed)
REFERRING PROVIDER: Elon Alas, MD  PRIMARY PROVIDER:  Ann Held, DO  PRIMARY REASON FOR VISIT:  1. Family history of malignant neoplasm of breast      HISTORY OF PRESENT ILLNESS:   [name], a 41 y.o. female, was seen for a [facility] cancer genetics consultation at the request of Dr. Annamaria Boots due to a family history of cancer.  Jessica Molina presents to clinic today to discuss the possibility of a hereditary predisposition to cancer, genetic testing, and to further clarify her future cancer risks, as well as potential cancer risks for family members.    Jessica Molina is a 41 y.o. female with no personal history of cancer.    CANCER HISTORY:   No history exists.     HORMONAL RISK FACTORS:  Menarche was at age 55.  First live birth at age 46.  OCP use for approximately not assessed years.  Ovaries intact: yes.  Hysterectomy: partial.  Menopausal status: premenopausal.  HRT use: 0 years. Colonoscopy: yes; normal. Mammogram within the last year: yes. Number of breast biopsies: 0. Up to date with pelvic exams:  n/a. Any excessive radiation exposure in the past:  no  Past Medical History:  Diagnosis Date  . 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 Anxiety   . Hypertension   . Migraine   . PONV (postoperative nausea and vomiting)    hard to wake up after appendectomy  . Renal vein stenosis    "Kinked"    Past Surgical History:  Procedure Laterality Date  . ABDOMINAL HYSTERECTOMY    . ANEURYSM SURGERY  01/2015  . APPENDECTOMY    . BTL  2006  . CESAREAN SECTION     Twins,  BTL  . CYSTOSCOPY N/A 06/25/2016   Procedure: CYSTOSCOPY;  Surgeon: Anastasio Auerbach, MD;  Location: [facility];  Service: Gynecology;  Laterality: N/A;  . LAPAROSCOPIC VAGINAL HYSTERECTOMY WITH SALPINGECTOMY Bilateral 06/25/2016   Procedure: LAPAROSCOPIC ASSISTED VAGINAL HYSTERECTOMY WITH Bilateral SALPINGECTOMY, Lysis of  Adhesions;  Surgeon: Anastasio Auerbach, MD;  Location: [facility];  Service: Gynecology;  Laterality: Bilateral;  . RENAL ANGIOPLASTY      Social History   Social History  . Marital status: Married    Spouse name: N/A  . Number of children: 4  . Years of education: HS+   Occupational History  . Nurse Tech    Social History Main Topics  . Smoking status: Never Smoker  . Smokeless tobacco: Never Used  . Alcohol use 0.0 oz/week     Comment: 2-3 drinks per week  . Drug use: No  . Sexual activity: Yes    Partners: Male    Birth control/ protection: Surgical   Other Topics Concern  . Not on file   Social History Narrative   Lives at home with husband and children.   Right-handed.   2-3 cups caffeine per week.     FAMILY HISTORY:  We obtained a detailed, 4-generation family history.  Significant diagnoses are listed below: Family History  Problem Relation Age of Onset  . Breast cancer Mother     breast   . Thyroid disease Mother   . Cancer Mother 5    breast  . Hyperlipidemia Mother   . Hypertension Father   . Stroke Father   . Melanoma Father     melanoma   . Cancer Father     melanoma  . Breast  cancer Maternal Grandmother     breast   . Thyroid disease Maternal Grandmother   . Cancer Maternal Grandfather     unknown   . Cancer Paternal Grandmother     unknow     Jessica Molina is unaware of previous family history of genetic testing for hereditary cancer risks. Patient's maternal ancestors are of [place] and otherwise unknown descent, and paternal ancestors are of [place], Native [place], [place], [place] and otherwise unknown descent. There is no reported Ashkenazi Jewish ancestry. There is no known consanguinity.  GENETIC COUNSELING ASSESSMENT: Johnita Palleschi is a 41 y.o. female with a family history which is somewhat suggestive of a hereditary predisposition to cancer. We, therefore, discussed and recommended the following at today's visit.   DISCUSSION: We  reviewed the characteristics, features and inheritance patterns of hereditary cancer syndromes. We also discussed genetic testing, including the appropriate family members to test, the process of testing, insurance coverage and turn-around-time for results. We discussed the implications of a negative, positive and/or variant of uncertain significant result. We recommended Ms. Near pursue genetic testing for the Common Hereditary Cancer gene panel.   Based on [name]'s family history of cancer, she meets medical criteria for genetic testing. Despite that she meets criteria, she may still have an out of pocket cost. We discussed that if her out of pocket cost for testing is over $100, the laboratory will call and confirm whether she wants to proceed with testing.  If the out of pocket cost of testing is less than $100 she will be billed by the genetic testing laboratory.   Based on the patient's personal and family history, statistical models (Tyrer-Cuzick)  and literature data were used to estimate her risk of developing breast cancer. These estimate her lifetime risk of developing breast cancer to be approximately 30.9%. This estimation does not take into account any genetic testing results.  The patient's lifetime breast cancer risk is a preliminary estimate based on available information using one of several models endorsed by the [employer] (ACS). The ACS recommends consideration of breast MRI screening as an adjunct to mammography for patients at high risk (defined as 20% or greater lifetime risk). A more detailed breast cancer risk assessment can be considered, if clinically indicated.   [name] has been determined to be at high risk for breast cancer.  Therefore, we recommend that annual screening with mammography and breast MRI begin at age 59, or 10 years prior to the age of breast cancer diagnosis in a relative (whichever is earlier).  We discussed that [name] should  discuss her individual situation with her referring physician and determine a breast cancer screening plan with which they are both comfortable.    PLAN: After considering the risks, benefits, and limitations, Ms. Henricks  provided informed consent to pursue genetic testing and the blood sample was sent to [facility] for analysis of the Common Hereditary Cancer Panel test. Testing included analysis of 43 genes (APC, ATM, AXIN2, BARD1, BMPR1A, BRCA1, BRCA2, BRIP1, CDH1, CDKN2A, CHEK2, DICER1, EPCAM, GREM1, HOXB13, KIT, MEN1, MLH1, MSH2, MSH6, MUTYH, NBN, NF1, PALB2, PDGFRA, PMS2, POLD1, POLE, PTEN, RAD50, RAD51C, RAD51D, SDHA, SDHB, SDHC, SDHD, SMAD4, SMARCA4, STK11, TP53, TSC1, TSC2, VHL). Results should be available within approximately 2-3 weeks' time, at which point they will be disclosed by telephone to Ms. Henery, as will any additional recommendations warranted by these results. Ms. Date will receive a summary of her genetic counseling visit and a copy of her  results once available. This information will also be available in Epic. We encouraged Ms. Wachs to remain in contact with cancer genetics annually so that we can continuously update the family history and inform her of any changes in cancer genetics and testing that may be of benefit for her family. Ms. Graul questions were answered to her satisfaction today. Our contact information was provided should additional questions or concerns arise.  We discussed the limitations of testing an individual who has not been diagnosed with cancer. If Ms. Samuelson tests negative for mutations on this test, we would still consider her at moderately increased risk for breast cancer and recommend she consider breast MRI.  Lastly, we encouraged Ms. Groll to remain in contact with cancer genetics annually so that we can continuously update the family history and inform her of any changes in cancer genetics and testing that may be of benefit for  this family.   Ms.  Casale questions were answered to her satisfaction today. Our contact information was provided should additional questions or concerns arise. Thank you for the referral and allowing [place] to share in the care of your patient.   Benay Pike, MS, [facility] Certified Genetic Counselor  The patient was seen for a total of 30 minutes in face-to-face genetic counseling.  This patient was discussed with Drs. Magrinat, Lindi Adie and/or Burr Medico who agrees with the above.    _______________________________________________________________________ For Office Staff:  Number of people involved in session: 1 Was an Intern/ student involved with case: no

## 2016-12-02 ENCOUNTER — Telehealth: Payer: Self-pay | Admitting: Genetic Counselor

## 2016-12-02 ENCOUNTER — Ambulatory Visit: Payer: Self-pay | Admitting: Genetic Counselor

## 2016-12-02 DIAGNOSIS — Z1379 Encounter for other screening for genetic and chromosomal anomalies: Secondary | ICD-10-CM | POA: Insufficient documentation

## 2016-12-02 DIAGNOSIS — Z803 Family history of malignant neoplasm of breast: Secondary | ICD-10-CM

## 2016-12-02 NOTE — Telephone Encounter (Signed)
Discussed negative genetic test results with patient. Please see encounter note for more details.

## 2016-12-02 NOTE — Progress Notes (Signed)
[facility]    Patient Name: Jessica Molina Patient DOB: 1976/02/25 Patient Age: 41 y.o. Encounter Date: 12/02/2016  Referring Provider: Tomma Lightning, MD  Primary Care Provider: Ann Held, DO  Ms. Hornbaker was called today to discuss genetic test results. Please see the Genetics note from her visit on 11/22/2016 for a detailed discussion of her personal and family history.  Genetic Testing: At the time of Ms. Melling's visit, we recommended she pursue genetic testing of multiple genes associated with a hereditary predisposition to cancer. Testing included sequencing and deletion/duplication analysis of 43 genes on [facility]'s Common Cancers panel (APC, ATM, AXIN2, BARD1, BMPR1A, BRCA1, BRCA2, BRIP1, CDH1, CDKN2A, CHEK2, DICER1, EPCAM, GREM1, HOXB13, KIT, MEN1, MLH1, MSH2, MSH6, MUTYH, NBN, NF1, PALB2, PDGFRA, PMS2, POLD1, POLE, PTEN, RAD50, RAD51C, RAD51D, SDHA, SDHB, SDHC, SDHD, SMAD4, SMARCA4, STK11, TP53, TSC1, TSC2, VHL). Testing was normal and did not reveal a mutation in these genes. A copy of the genetic test report will be scanned into Epic under the media tab.  Since the current test is not perfect, it is possible there may be a gene mutation that current testing cannot detect, but that chance is small. We also discussed that it is possible that a different genetic factor, which was not part of this testing or has not yet been discovered, is responsible for the cancer diagnoses in the family. Again, the likelihood of this is low. No additional testing is recommended at this time.    Cancer Screening: Given the family history of early onset breast cancer, and possible bilateral breast cancer, in her mother, we must interpret these negative results with some caution. Families with features suggestive of hereditary risk tend to have multiple family members with cancer, diagnoses in multiple generations and diagnoses before the age of 70.  Ms. Arauz's family exhibits some of these features. This result may simply reflect our current inability to detect all mutations within these genes. It is also possible there is a different gene, that we have not yet tested, that may be responsible for the cancer in this family.   We discussed that it would be reasonable for Ms. Posch be followed by a high-risk breast clinic; in addition to a yearly mammogram and physical exam twice per year, she should discuss the usefulness of an annual breast MRI with the high-risk clinic providers.   Family Members: Given the young age of breast cancer in the family, women are recommended to have a yearly mammogram beginning at age 57, which is 67 years earlier than the youngest breast cancer in the family and discuss with their own provider whether there is a benefit to also having a breast MRI. Women should also have a clinical breast exam every 6-12 months, a yearly gynecologic exam and perform monthly breast self-exams. Colon cancer screening is recommended to begin by age 75 in both men and women.  Lastly, cancer genetics is a rapidly advancing field and it is possible that new genetic tests will be appropriate for her in the future. We encourage her to remain in contact with [place] on an annual basis so we can update her personal and family histories, and let her know of advances in cancer genetics that may benefit the family. Our contact number was provided. Ms. Fauteux is welcome to call anytime with additional questions.    Marylouise Stacks, MS, [facility] Certified Genetic Counselor phone: [phone] [email or web address]

## 2016-12-05 ENCOUNTER — Ambulatory Visit: Payer: Managed Care, Other (non HMO)

## 2017-01-14 ENCOUNTER — Encounter (HOSPITAL_COMMUNITY): Payer: Self-pay

## 2017-01-17 ENCOUNTER — Other Ambulatory Visit: Payer: Self-pay | Admitting: Neurology

## 2017-01-20 ENCOUNTER — Other Ambulatory Visit: Payer: Self-pay | Admitting: Neurology

## 2017-02-05 ENCOUNTER — Telehealth: Payer: Self-pay | Admitting: Neurology

## 2017-02-05 NOTE — Telephone Encounter (Signed)
Can you let me know if this patient owes a balance for their last injection?  °

## 2017-02-12 ENCOUNTER — Ambulatory Visit: Payer: Commercial Managed Care - PPO | Admitting: Neurology

## 2017-02-19 ENCOUNTER — Ambulatory Visit: Payer: Commercial Managed Care - PPO | Admitting: Neurology

## 2017-03-04 ENCOUNTER — Other Ambulatory Visit: Payer: Self-pay | Admitting: Neurology

## 2017-03-10 ENCOUNTER — Other Ambulatory Visit: Payer: Self-pay | Admitting: Neurology

## 2017-04-10 ENCOUNTER — Encounter: Payer: Self-pay | Admitting: Family Medicine

## 2017-04-10 ENCOUNTER — Ambulatory Visit (INDEPENDENT_AMBULATORY_CARE_PROVIDER_SITE_OTHER): Payer: Commercial Managed Care - PPO | Admitting: Family Medicine

## 2017-04-10 VITALS — BP 144/98 | HR 78 | Temp 98.3°F | Resp 16 | Ht 63.0 in | Wt 160.6 lb

## 2017-04-10 DIAGNOSIS — R600 Localized edema: Secondary | ICD-10-CM | POA: Diagnosis not present

## 2017-04-10 DIAGNOSIS — I10 Essential (primary) hypertension: Secondary | ICD-10-CM | POA: Diagnosis not present

## 2017-04-10 LAB — LIPID PANEL
Cholesterol: 231 mg/dL — ABNORMAL HIGH (ref 0–200)
HDL: 60.8 mg/dL (ref >39.00)
LDL Cholesterol: 143 mg/dL — ABNORMAL HIGH (ref 0–99)
NonHDL: 169.85
Total CHOL/HDL Ratio: 4
Triglycerides: 136 mg/dL (ref 0.0–149.0)
VLDL: 27.2 mg/dL (ref 0.0–40.0)

## 2017-04-10 LAB — COMPREHENSIVE METABOLIC PANEL
ALT: 13 U/L (ref 0–35)
AST: 19 U/L (ref 0–37)
Albumin: 4.3 g/dL (ref 3.5–5.2)
Alkaline Phosphatase: 56 U/L (ref 39–117)
BUN: 14 mg/dL (ref 6–23)
CO2: 22 mEq/L (ref 19–32)
Calcium: 9.7 mg/dL (ref 8.4–10.5)
Chloride: 107 mEq/L (ref 96–112)
Creatinine, Ser: 0.65 mg/dL (ref 0.40–1.20)
GFR: 106.36 mL/min (ref >60.00)
Glucose, Bld: 83 mg/dL (ref 70–99)
Potassium: 4.2 mEq/L (ref 3.5–5.1)
Sodium: 138 mEq/L (ref 135–145)
Total Bilirubin: 0.4 mg/dL (ref 0.2–1.2)
Total Protein: 7.2 g/dL (ref 6.0–8.3)

## 2017-04-10 LAB — CBC WITH DIFFERENTIAL/PLATELET
Basophils Absolute: 0.1 10*3/uL (ref 0.0–0.1)
Basophils Relative: 1.2 % (ref 0.0–3.0)
Eosinophils Absolute: 0.1 10*3/uL (ref 0.0–0.7)
Eosinophils Relative: 2.5 % (ref 0.0–5.0)
HCT: 40.7 % (ref 36.0–46.0)
Hemoglobin: 13.7 g/dL (ref 12.0–15.0)
Lymphocytes Relative: 38.3 % (ref 12.0–46.0)
Lymphs Abs: 1.9 10*3/uL (ref 0.7–4.0)
MCHC: 33.7 g/dL (ref 30.0–36.0)
MCV: 94.7 fl (ref 78.0–100.0)
Monocytes Absolute: 0.3 10*3/uL (ref 0.1–1.0)
Monocytes Relative: 5.4 % (ref 3.0–12.0)
Neutro Abs: 2.6 10*3/uL (ref 1.4–7.7)
Neutrophils Relative %: 52.6 % (ref 43.0–77.0)
Platelets: 317 10*3/uL (ref 150.0–400.0)
RBC: 4.3 Mil/uL (ref 3.87–5.11)
RDW: 12.1 % (ref 11.5–15.5)
WBC: 4.9 10*3/uL (ref 4.0–10.5)

## 2017-04-10 LAB — TSH: TSH: 0.88 u[IU]/mL (ref 0.35–4.50)

## 2017-04-10 MED ORDER — LOSARTAN POTASSIUM-HCTZ 100-25 MG PO TABS: 1.0000 | ORAL_TABLET | Freq: Every day | ORAL | 3 refills | Status: DC

## 2017-04-10 NOTE — Progress Notes (Signed)
Patient ID: Jessica Molina, female   DOB: 09-Mar-1976, 41 y.o.   MRN: [id number]     Subjective:  I acted as a Neurosurgeon for Dr. Zola Button.  Apolonio Schneiders, CMA   Patient ID: Jessica Molina, female    DOB: January 09, 1976, 41 y.o.   MRN: [id number]  Chief Complaint  Patient presents with  . Hypertension  . Weight Gain    gain over 20lbs    HPI  Patient is in today for blood pressure and weight gain.  She stopped medications because she saw someone at [facility] and did not want to go back to follow up to get medications refilled. She has gained over 20lbs in 2 weeks.  Patient Care Team: Zola Button, Grayling Congress, DO as PCP - General (Family Medicine) Drema Dallas, DO as Consulting Physician (Neurology) Griffin Dakin, MD as Attending Physician (Neurosurgery) Logan Bores, Sharman Cheek, MD (Nephrology)   Past Medical History:  Diagnosis Date  . 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 Anxiety   . Hypertension   . Migraine   . PONV (postoperative nausea and vomiting)    hard to wake up after appendectomy  . Renal vein stenosis    "Kinked"    Past Surgical History:  Procedure Laterality Date  . ABDOMINAL HYSTERECTOMY    . ANEURYSM SURGERY  01/2015  . APPENDECTOMY    . BTL  2006  . CESAREAN SECTION     Twins,  BTL  . CYSTOSCOPY N/A 06/25/2016   Procedure: CYSTOSCOPY;  Surgeon: Dara Lords, MD;  Location: [facility];  Service: Gynecology;  Laterality: N/A;  . LAPAROSCOPIC VAGINAL HYSTERECTOMY WITH SALPINGECTOMY Bilateral 06/25/2016   Procedure: LAPAROSCOPIC ASSISTED VAGINAL HYSTERECTOMY WITH Bilateral SALPINGECTOMY, Lysis of Adhesions;  Surgeon: Dara Lords, MD;  Location: [facility];  Service: Gynecology;  Laterality: Bilateral;  . RENAL ANGIOPLASTY      Family History  Problem Relation Age of Onset  . Breast cancer Mother        breast   . Thyroid disease Mother   . Cancer Mother 42       breast  . Hyperlipidemia  Mother   . Hypertension Father   . Stroke Father   . Melanoma Father        melanoma   . Cancer Father        melanoma  . Breast cancer Maternal Grandmother        breast   . Thyroid disease Maternal Grandmother   . Cancer Maternal Grandfather        unknown   . Cancer Paternal Grandmother        unknow     Social History   Social History  . Marital status: Married    Spouse name: N/A  . Number of children: 4  . Years of education: HS+   Occupational History  . Nurse Tech    Social History Main Topics  . Smoking status: Never Smoker  . Smokeless tobacco: Never Used  . Alcohol use 0.0 oz/week     Comment: 2-3 drinks per week  . Drug use: No  . Sexual activity: Yes    Partners: Male    Birth control/ protection: Surgical   Other Topics Concern  . Not on file   Social History Narrative   Lives at home with husband and children.   Right-handed.   2-3 cups caffeine per week.    Outpatient Medications Prior to Visit  Medication Sig Dispense Refill  . BOTOX 100 units SOLR injection 200 Units every 3 (three) months.    . Multiple Vitamin (MULTIVITAMIN WITH MINERALS) TABS tablet Take 1 tablet by mouth daily.    . ondansetron (ZOFRAN ODT) 4 MG disintegrating tablet Take 1 tablet (4 mg total) by mouth every 8 (eight) hours as needed. 30 tablet 11  . traMADol (ULTRAM) 50 MG tablet Take 1 tablet (50 mg total) by mouth every 6 (six) hours as needed. #30 must last 30 days. 30 tablet 5  . hydrochlorothiazide (HYDRODIURIL) 25 MG tablet Take 25 mg by mouth daily.    Marland Kitchen losartan (COZAAR) 100 MG tablet Take 100 mg by mouth daily. Reported on 11/07/2015    . naproxen (NAPROSYN) 500 MG tablet Take 1 tablet (500 mg total) by mouth every 12 (twelve) hours as needed. 60 tablet 6  . Riboflavin (B2) 100 MG TABS Take 1 tablet by mouth every morning.    . zolmitriptan (ZOMIG) 5 MG tablet Take 1 tablet (5 mg total) by mouth as needed for migraine. 12 tablet 6  . zolpidem (AMBIEN CR) 12.5 MG  CR tablet TAKE 1 TABLET AT BEDTIME 30 tablet 2   No facility-administered medications prior to visit.     Allergies  Allergen Reactions  . Lisinopril Cough  . Metoprolol Other (See Comments)    did not feel well when taking     Review of Systems  Constitutional: Negative for fever and malaise/fatigue.       Weight gain   HENT: Negative for congestion.   Eyes: Negative for blurred vision.  Respiratory: Negative for cough and shortness of breath.   Cardiovascular: Negative for chest pain, palpitations and leg swelling.  Gastrointestinal: Negative for vomiting.  Musculoskeletal: Negative for back pain.  Skin: Negative for rash.  Neurological: Negative for loss of consciousness and headaches.       Objective:    Physical Exam  Constitutional: She is oriented to person, place, and time. She appears well-developed and well-nourished. No distress.  HENT:  Head: Normocephalic and atraumatic.  Eyes: Conjunctivae are normal.  Neck: Normal range of motion. No thyromegaly present.  Cardiovascular: Normal rate and regular rhythm.   Pulmonary/Chest: Effort normal and breath sounds normal. She has no wheezes.  Abdominal: Soft. Bowel sounds are normal. There is no tenderness.  Musculoskeletal: Normal range of motion. She exhibits no edema or deformity.  Neurological: She is alert and oriented to person, place, and time.  Skin: Skin is warm and dry. She is not diaphoretic.  Psychiatric: She has a normal mood and affect.    BP (!) 144/98   Pulse 78   Temp 98.3 F (36.8 C) (Oral)   Resp 16   Ht 5\' 3"  (1.6 m)   Wt 160 lb 9.6 oz (72.8 kg)   LMP 06/19/2016 (Exact Date)   SpO2 98%   BMI 28.45 kg/m  Wt Readings from Last 3 Encounters:  04/10/17 160 lb 9.6 oz (72.8 kg)  11/07/16 146 lb (66.2 kg)  11/06/16 148 lb (67.1 kg)   BP Readings from Last 3 Encounters:  04/10/17 (!) 144/98  11/07/16 120/78  11/06/16 117/77     Immunization History  Administered Date(s) Administered    . Influenza-Unspecified 08/31/2015  . Td 12/08/2006    Health Maintenance  Topic Date Due  . HIV Screening  06/30/1990  . TETANUS/TDAP  12/07/2016  . INFLUENZA VACCINE  04/30/2017  . PAP SMEAR  11/06/2018    Lab Results  Component Value Date   WBC 4.9 04/10/2017   HGB 13.7 04/10/2017   HCT 40.7 04/10/2017   PLT 317.0 04/10/2017   GLUCOSE 83 04/10/2017   CHOL 231 (H) 04/10/2017   TRIG 136.0 04/10/2017   HDL 60.80 04/10/2017   LDLCALC 143 (H) 04/10/2017   ALT 13 04/10/2017   AST 19 04/10/2017   NA 138 04/10/2017   K 4.2 04/10/2017   CL 107 04/10/2017   CREATININE 0.65 04/10/2017   BUN 14 04/10/2017   CO2 22 04/10/2017   TSH 0.88 04/10/2017   HGBA1C 5.0 11/07/2016    Lab Results  Component Value Date   TSH 0.88 04/10/2017   Lab Results  Component Value Date   WBC 4.9 04/10/2017   HGB 13.7 04/10/2017   HCT 40.7 04/10/2017   MCV 94.7 04/10/2017   PLT 317.0 04/10/2017   Lab Results  Component Value Date   NA 138 04/10/2017   K 4.2 04/10/2017   CO2 22 04/10/2017   GLUCOSE 83 04/10/2017   BUN 14 04/10/2017   CREATININE 0.65 04/10/2017   BILITOT 0.4 04/10/2017   ALKPHOS 56 04/10/2017   AST 19 04/10/2017   ALT 13 04/10/2017   PROT 7.2 04/10/2017   ALBUMIN 4.3 04/10/2017   CALCIUM 9.7 04/10/2017   ANIONGAP 10 10/22/2016   GFR 106.36 04/10/2017   Lab Results  Component Value Date   CHOL 231 (H) 04/10/2017   Lab Results  Component Value Date   HDL 60.80 04/10/2017   Lab Results  Component Value Date   LDLCALC 143 (H) 04/10/2017   Lab Results  Component Value Date   TRIG 136.0 04/10/2017   Lab Results  Component Value Date   CHOLHDL 4 04/10/2017   Lab Results  Component Value Date   HGBA1C 5.0 11/07/2016         Assessment & Plan:   Problem List Items Addressed This Visit      Unprioritized   Edema, lower extremity    Elevate legs hct in hyzaar Check labs       Relevant Orders   CBC with Differential/Platelet (Completed)    Lipid panel (Completed)   Comprehensive metabolic panel (Completed)   POCT Urinalysis Dipstick (Automated)   TSH (Completed)   Essential hypertension - Primary    Well controlled, no changes to meds. Encouraged heart healthy diet such as the DASH diet and exercise as tolerated.       Relevant Medications   losartan-hydrochlorothiazide (HYZAAR) 100-25 MG tablet   Other Relevant Orders   CBC with Differential/Platelet (Completed)   Lipid panel (Completed)   Comprehensive metabolic panel (Completed)   POCT Urinalysis Dipstick (Automated)   TSH (Completed)      I have discontinued Ms. Benard's losartan, hydrochlorothiazide, B2, zolpidem, naproxen, and zolmitriptan. I am also having her start on losartan-hydrochlorothiazide. Additionally, I am having her maintain her multivitamin with minerals, ondansetron, BOTOX, traMADol, busPIRone, and ALPRAZolam.  Meds ordered this encounter  Medications  . busPIRone (BUSPAR) 15 MG tablet  . ALPRAZolam (XANAX) 0.5 MG tablet  . losartan-hydrochlorothiazide (HYZAAR) 100-25 MG tablet    Sig: Take 1 tablet by mouth daily.    Dispense:  90 tablet    Refill:  3    CMA served as scribe during this visit. History, Physical and Plan performed by medical provider. Documentation and orders reviewed and attested to.  Donato SchultzYvonne R Lowne Chase, DO

## 2017-04-10 NOTE — Patient Instructions (Signed)

## 2017-04-13 DIAGNOSIS — R600 Localized edema: Secondary | ICD-10-CM | POA: Insufficient documentation

## 2017-04-13 NOTE — Assessment & Plan Note (Signed)
Well controlled, no changes to meds. Encouraged heart healthy diet such as the DASH diet and exercise as tolerated.  °

## 2017-04-13 NOTE — Assessment & Plan Note (Signed)
Elevate legs hct in hyzaar Check labs

## 2017-04-18 ENCOUNTER — Other Ambulatory Visit: Payer: Self-pay | Admitting: Family Medicine

## 2017-04-18 DIAGNOSIS — E785 Hyperlipidemia, unspecified: Secondary | ICD-10-CM

## 2017-04-25 ENCOUNTER — Ambulatory Visit (INDEPENDENT_AMBULATORY_CARE_PROVIDER_SITE_OTHER): Payer: Commercial Managed Care - PPO | Admitting: Family Medicine

## 2017-04-25 ENCOUNTER — Encounter: Payer: Self-pay | Admitting: Family Medicine

## 2017-04-25 VITALS — BP 122/82 | HR 99 | Temp 98.3°F | Resp 16 | Ht 63.0 in | Wt 160.2 lb

## 2017-04-25 DIAGNOSIS — I10 Essential (primary) hypertension: Secondary | ICD-10-CM | POA: Diagnosis not present

## 2017-04-25 DIAGNOSIS — J40 Bronchitis, not specified as acute or chronic: Secondary | ICD-10-CM | POA: Diagnosis not present

## 2017-04-25 MED ORDER — AZITHROMYCIN 250 MG PO TABS: ORAL_TABLET | ORAL | 0 refills | Status: DC

## 2017-04-25 MED ORDER — PROMETHAZINE-DM 6.25-15 MG/5ML PO SYRP: 5.0000 mL | ORAL_SOLUTION | Freq: Four times a day (QID) | ORAL | 0 refills | Status: DC | PRN

## 2017-04-25 NOTE — Progress Notes (Signed)
Patient ID: Jessica Molina, female   DOB: 12/20/1975, 41 y.o.   MRN: [id number]     Subjective:  I acted as a Neurosurgeonscribe for Dr. Zola Molina.  Jessica Molina, CMA   Patient ID: Jessica Molina, female    DOB: 08/13/1976, 41 y.o.   MRN: [id number]  Chief Complaint  Patient presents with  . Hypertension  . Cough    Hypertension  This is a chronic problem. Pertinent negatives include no anxiety, blurred vision, chest pain, headaches, malaise/fatigue, neck pain, orthopnea, palpitations, peripheral edema, PND, shortness of breath or sweats. Treatments tried: Marine scientistlosartan/hctz.  Cough  This is a new problem. The current episode started in the past 7 days. The cough is productive of sputum (green sputum). Associated symptoms include ear congestion, nasal congestion, postnasal drip, rhinorrhea and wheezing. Pertinent negatives include no chest pain, chills, ear pain, fever, headaches, hemoptysis, myalgias, rash, sore throat, shortness of breath or sweats. She has tried nothing for the symptoms.    Patient is in today for follow up blood pressure and cough.  Patient Care Team: Jessica Molina, Jessica CongressYvonne R, DO as PCP - General (Family Medicine) Jessica Molina, Adam R, DO as Consulting Physician (Neurology) Griffin DakinBernard, Joe, MD as Attending Physician (Neurosurgery) Jessica BoresEvans, Sharman CheekKimberley Jessica Johnson, MD (Nephrology)   Past Medical History:  Diagnosis Date  . 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 Anxiety   . Hypertension   . Migraine   . PONV (postoperative nausea and vomiting)    hard to wake up after appendectomy  . Renal vein stenosis    "Kinked"    Past Surgical History:  Procedure Laterality Date  . ABDOMINAL HYSTERECTOMY    . ANEURYSM SURGERY  01/2015  . APPENDECTOMY    . BTL  2006  . CESAREAN SECTION     Twins,  BTL  . CYSTOSCOPY N/A 06/25/2016   Procedure: CYSTOSCOPY;  Surgeon: Dara Lordsimothy P Fontaine, MD;  Location: [facility];  Service: Gynecology;   Laterality: N/A;  . LAPAROSCOPIC VAGINAL HYSTERECTOMY WITH SALPINGECTOMY Bilateral 06/25/2016   Procedure: LAPAROSCOPIC ASSISTED VAGINAL HYSTERECTOMY WITH Bilateral SALPINGECTOMY, Lysis of Adhesions;  Surgeon: Dara Lordsimothy P Fontaine, MD;  Location: [facility];  Service: Gynecology;  Laterality: Bilateral;  . RENAL ANGIOPLASTY      Family History  Problem Relation Age of Onset  . Breast cancer Mother        breast   . Thyroid disease Mother   . Cancer Mother [id number]       breast  . Hyperlipidemia Mother   . Hypertension Father   . Stroke Father   . Melanoma Father        melanoma   . Cancer Father        melanoma  . Breast cancer Maternal Grandmother        breast   . Thyroid disease Maternal Grandmother   . Cancer Maternal Grandfather        unknown   . Cancer Paternal Grandmother        unknow     Social History   Social History  . Marital status: Married    Spouse name: N/A  . Number of children: 4  . Years of education: HS+   Occupational History  . nurse tech    Social History Main Topics  . Smoking status: Never Smoker  . Smokeless tobacco: Never Used  . Alcohol use 0.0 oz/week     Comment: 2-3 drinks per week  . Drug  use: No  . Sexual activity: Yes    Partners: Male    Birth control/ protection: Surgical   Other Topics Concern  . Not on file   Social History Narrative   Lives at home with husband and children.   Right-handed.   2-3 cups caffeine per week.       Outpatient Medications Prior to Visit  Medication Sig Dispense Refill  . ALPRAZolam (XANAX) 0.5 MG tablet     . BOTOX 100 units SOLR injection 200 Units every 3 (three) months.    Marland Kitchen losartan-hydrochlorothiazide (HYZAAR) 100-25 MG tablet Take 1 tablet by mouth daily. 90 tablet 3  . Multiple Vitamin (MULTIVITAMIN WITH MINERALS) TABS tablet Take 1 tablet by mouth daily.    . ondansetron (ZOFRAN ODT) 4 MG disintegrating tablet Take 1 tablet (4 mg total) by mouth every 8 (eight) hours as needed. 30 tablet  11  . traMADol (ULTRAM) 50 MG tablet Take 1 tablet (50 mg total) by mouth every 6 (six) hours as needed. #30 must last 30 days. 30 tablet 5  . busPIRone (BUSPAR) 15 MG tablet      No facility-administered medications prior to visit.     Allergies  Allergen Reactions  . Lisinopril Cough  . Metoprolol Other (See Comments)    did not feel well when taking     Review of Systems  Constitutional: Negative for chills, fever and malaise/fatigue.  HENT: Positive for postnasal drip and rhinorrhea. Negative for congestion, ear pain and sore throat.   Eyes: Negative for blurred vision.  Respiratory: Positive for cough and wheezing. Negative for hemoptysis and shortness of breath.   Cardiovascular: Negative for chest pain, palpitations, orthopnea, leg swelling and PND.  Gastrointestinal: Negative for vomiting.  Musculoskeletal: Negative for back pain, myalgias and neck pain.  Skin: Negative for rash.  Neurological: Negative for loss of consciousness and headaches.       Objective:    Physical Exam  Constitutional: She is oriented to person, place, and time. She appears well-developed and well-nourished.  HENT:  Right Ear: Hearing, tympanic membrane, external ear and ear canal normal.  Left Ear: Hearing, tympanic membrane, external ear and ear canal normal.  Nose: Rhinorrhea present. Right sinus exhibits no maxillary sinus tenderness and no frontal sinus tenderness. Left sinus exhibits no maxillary sinus tenderness and no frontal sinus tenderness.  + PND + errythema  Eyes: Conjunctivae are normal. Right eye exhibits no discharge. Left eye exhibits no discharge.  Cardiovascular: Normal rate, regular rhythm and normal heart sounds.   No murmur heard. Pulmonary/Chest: Effort normal. No respiratory distress. She has wheezes. She exhibits no tenderness.  Musculoskeletal: She exhibits no edema.  Lymphadenopathy:    She has cervical adenopathy.  Neurological: She is alert and oriented to  person, place, and time.  Nursing note and vitals reviewed.   BP 122/82 (BP Location: Right Arm, Cuff Size: Normal)   Pulse 99   Temp 98.3 F (36.8 C) (Oral)   Resp 16   Ht 5\' 3"  (1.6 m)   Wt 160 lb 3.2 oz (72.7 kg)   LMP 06/19/2016 (Exact Date)   SpO2 98%   BMI 28.38 kg/m  Wt Readings from Last 3 Encounters:  04/25/17 160 lb 3.2 oz (72.7 kg)  04/10/17 160 lb 9.6 oz (72.8 kg)  11/07/16 146 lb (66.2 kg)   BP Readings from Last 3 Encounters:  04/25/17 122/82  04/10/17 (!) 144/98  11/07/16 120/78     Immunization History  Administered Date(s) Administered  .  Influenza-Unspecified 08/31/2015  . Td 12/08/2006    Health Maintenance  Topic Date Due  . HIV Screening  06/30/1990  . TETANUS/TDAP  12/07/2016  . INFLUENZA VACCINE  04/30/2017  . PAP SMEAR  11/06/2018    Lab Results  Component Value Date   WBC 4.9 04/10/2017   HGB 13.7 04/10/2017   HCT 40.7 04/10/2017   PLT 317.0 04/10/2017   GLUCOSE 83 04/10/2017   CHOL 231 (H) 04/10/2017   TRIG 136.0 04/10/2017   HDL 60.80 04/10/2017   LDLCALC 143 (H) 04/10/2017   ALT 13 04/10/2017   AST 19 04/10/2017   NA 138 04/10/2017   K 4.2 04/10/2017   CL 107 04/10/2017   CREATININE 0.65 04/10/2017   BUN 14 04/10/2017   CO2 22 04/10/2017   TSH 0.88 04/10/2017   HGBA1C 5.0 11/07/2016    Lab Results  Component Value Date   TSH 0.88 04/10/2017   Lab Results  Component Value Date   WBC 4.9 04/10/2017   HGB 13.7 04/10/2017   HCT 40.7 04/10/2017   MCV 94.7 04/10/2017   PLT 317.0 04/10/2017   Lab Results  Component Value Date   NA 138 04/10/2017   K 4.2 04/10/2017   CO2 22 04/10/2017   GLUCOSE 83 04/10/2017   BUN 14 04/10/2017   CREATININE 0.65 04/10/2017   BILITOT 0.4 04/10/2017   ALKPHOS 56 04/10/2017   AST 19 04/10/2017   ALT 13 04/10/2017   PROT 7.2 04/10/2017   ALBUMIN 4.3 04/10/2017   CALCIUM 9.7 04/10/2017   ANIONGAP 10 10/22/2016   GFR 106.36 04/10/2017   Lab Results  Component Value Date   CHOL  231 (H) 04/10/2017   Lab Results  Component Value Date   HDL 60.80 04/10/2017   Lab Results  Component Value Date   LDLCALC 143 (H) 04/10/2017   Lab Results  Component Value Date   TRIG 136.0 04/10/2017   Lab Results  Component Value Date   CHOLHDL 4 04/10/2017   Lab Results  Component Value Date   HGBA1C 5.0 11/07/2016         Assessment & Plan:   Problem List Items Addressed This Visit      Unprioritized   Essential hypertension    Well controlled, no changes to meds. Encouraged heart healthy diet such as the DASH diet and exercise as tolerated.         Other Visit Diagnoses    Bronchitis    -  Primary   Relevant Medications   promethazine-dextromethorphan (PROMETHAZINE-DM) 6.25-15 MG/5ML syrup   azithromycin (ZITHROMAX Z-PAK) 250 MG tablet      I have discontinued Ms. Ogan's busPIRone. I am also having her start on promethazine-dextromethorphan and azithromycin. Additionally, I am having her maintain her multivitamin with minerals, ondansetron, BOTOX, traMADol, ALPRAZolam, and losartan-hydrochlorothiazide.  Meds ordered this encounter  Medications  . promethazine-dextromethorphan (PROMETHAZINE-DM) 6.25-15 MG/5ML syrup    Sig: Take 5 mLs by mouth 4 (four) times daily as needed.    Dispense:  118 mL    Refill:  0  . azithromycin (ZITHROMAX Z-PAK) 250 MG tablet    Sig: As directed    Dispense:  6 each    Refill:  0    CMA served as scribe during this visit. History, Physical and Plan performed by medical provider. Documentation and orders reviewed and attested to.  Donato SchultzYvonne Molina Jessica Chase, DO

## 2017-04-25 NOTE — Patient Instructions (Signed)

## 2017-04-27 NOTE — Assessment & Plan Note (Signed)
Well controlled, no changes to meds. Encouraged heart healthy diet such as the DASH diet and exercise as tolerated.  °

## 2017-06-16 ENCOUNTER — Ambulatory Visit (HOSPITAL_BASED_OUTPATIENT_CLINIC_OR_DEPARTMENT_OTHER)
Admission: RE | Admit: 2017-06-16 | Discharge: 2017-06-16 | Disposition: A | Discharge status: Home / Self Care | Payer: Commercial Managed Care - PPO | Source: Ambulatory Visit | Attending: Women's Health | Admitting: Women's Health

## 2017-06-16 ENCOUNTER — Ambulatory Visit (INDEPENDENT_AMBULATORY_CARE_PROVIDER_SITE_OTHER): Payer: Commercial Managed Care - PPO | Admitting: Medical

## 2017-06-16 ENCOUNTER — Encounter (HOSPITAL_BASED_OUTPATIENT_CLINIC_OR_DEPARTMENT_OTHER): Payer: Self-pay

## 2017-06-16 ENCOUNTER — Encounter: Payer: Self-pay | Admitting: Women's Health

## 2017-06-16 ENCOUNTER — Ambulatory Visit (HOSPITAL_BASED_OUTPATIENT_CLINIC_OR_DEPARTMENT_OTHER)
Admission: RE | Admit: 2017-06-16 | Discharge: 2017-06-16 | Disposition: A | Discharge status: Home / Self Care | Payer: Commercial Managed Care - PPO | Source: Ambulatory Visit | Attending: Medical | Admitting: Medical

## 2017-06-16 ENCOUNTER — Encounter: Payer: Self-pay | Admitting: Medical

## 2017-06-16 VITALS — BP 120/78 | HR 64 | Temp 98.3°F | Resp 16 | Ht 63.0 in | Wt 157.8 lb

## 2017-06-16 DIAGNOSIS — M542 Cervicalgia: Secondary | ICD-10-CM

## 2017-06-16 DIAGNOSIS — M546 Pain in thoracic spine: Secondary | ICD-10-CM | POA: Diagnosis not present

## 2017-06-16 DIAGNOSIS — M50322 Other cervical disc degeneration at C5-C6 level: Secondary | ICD-10-CM | POA: Diagnosis not present

## 2017-06-16 DIAGNOSIS — Z1231 Encounter for screening mammogram for malignant neoplasm of breast: Secondary | ICD-10-CM | POA: Diagnosis present

## 2017-06-16 DIAGNOSIS — M25512 Pain in left shoulder: Secondary | ICD-10-CM

## 2017-06-16 DIAGNOSIS — M19012 Primary osteoarthritis, left shoulder: Secondary | ICD-10-CM | POA: Insufficient documentation

## 2017-06-16 MED ORDER — HYDROCODONE-ACETAMINOPHEN 5-325 MG PO TABS: 1.0000 | ORAL_TABLET | Freq: Four times a day (QID) | ORAL | 0 refills | Status: DC | PRN

## 2017-06-16 MED ORDER — CYCLOBENZAPRINE HCL 10 MG PO TABS: 10.0000 mg | ORAL_TABLET | Freq: Every day | ORAL | 0 refills | Status: DC

## 2017-06-16 MED ORDER — DICLOFENAC SODIUM 75 MG PO TBEC: 75.0000 mg | DELAYED_RELEASE_TABLET | Freq: Two times a day (BID) | ORAL | 0 refills | Status: DC
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
## 2017-06-16 NOTE — Patient Instructions (Signed)
For your neck pain, left shoulder pain and lower thoracic region back pain, I am ordering x-rays to evaluate the area. Also prescribe Flexeril muscle relaxant, diclofenac NSAID and Norco for moderate to severe pain. While you are on Norco please do not take tramadol.  Work note/excuse until Wednesday.  We'll update you on x-ray results with her in calling or by my chart.  Follow-up in 7-10 days or as needed.

## 2017-07-09 ENCOUNTER — Ambulatory Visit: Payer: Commercial Managed Care - PPO | Admitting: Neurology

## 2017-07-16 ENCOUNTER — Ambulatory Visit: Payer: Commercial Managed Care - PPO | Admitting: Neurology

## 2017-07-18 ENCOUNTER — Other Ambulatory Visit: Payer: Commercial Managed Care - PPO

## 2017-07-22 ENCOUNTER — Other Ambulatory Visit: Payer: Commercial Managed Care - PPO

## 2017-07-31 ENCOUNTER — Other Ambulatory Visit: Payer: Self-pay | Admitting: Neurology

## 2017-08-06 ENCOUNTER — Telehealth: Payer: Self-pay | Admitting: Neurology

## 2017-08-06 NOTE — Telephone Encounter (Signed)
Pt called said [facility] advised her tramadol refill had been denied.I don't see a request. An appt has been scheduled for tomorrow at 9:00 with Dr Terrace ArabiaYan. Pt's daughter is at an eating disorder facility in [place] with anorexia and [place] [facility] for the past [place], [facility]. [place] after appt tomorrow. FYI

## 2017-08-06 NOTE — Telephone Encounter (Signed)
Noted. Patient must keep OV tomorrow.

## 2017-08-07 ENCOUNTER — Encounter: Payer: Self-pay | Admitting: Neurology

## 2017-08-07 ENCOUNTER — Ambulatory Visit: Payer: Commercial Managed Care - PPO | Admitting: Neurology

## 2017-08-07 VITALS — BP 128/88 | HR 71 | Ht 63.0 in | Wt 152.5 lb

## 2017-08-07 DIAGNOSIS — IMO0002 Reserved for concepts with insufficient information to code with codable children: Secondary | ICD-10-CM

## 2017-08-07 DIAGNOSIS — G43709 Chronic migraine without aura, not intractable, without status migrainosus: Secondary | ICD-10-CM | POA: Diagnosis not present

## 2017-08-07 MED ORDER — FREMANEZUMAB-VFRM 225 MG/1.5ML ~~LOC~~ SOSY: 225.0000 mg | PREFILLED_SYRINGE | SUBCUTANEOUS | 11 refills | Status: DC

## 2017-08-07 MED ORDER — DICLOFENAC POTASSIUM(MIGRAINE) 50 MG PO PACK: 50.0000 mg | PACK | ORAL | 11 refills | Status: DC | PRN

## 2017-08-07 MED ORDER — TRAMADOL HCL 50 MG PO TABS: 50.0000 mg | ORAL_TABLET | Freq: Four times a day (QID) | ORAL | 5 refills | Status: DC | PRN

## 2017-08-07 MED ORDER — ZONISAMIDE 100 MG PO CAPS: 100.0000 mg | ORAL_CAPSULE | Freq: Two times a day (BID) | ORAL | 11 refills | Status: DC

## 2017-08-07 MED ORDER — TIZANIDINE HCL 4 MG PO TABS: 4.0000 mg | ORAL_TABLET | Freq: Four times a day (QID) | ORAL | 11 refills | Status: DC | PRN

## 2017-08-07 NOTE — Progress Notes (Signed)
PATIENT: Jessica Molina DOB: Mar 01, 1976  Chief Complaint  Patient presents with  . Follow-up  . Migraine    needs refill tramadol, last botox in 10/2016.  Having daily headaches  Has headache today.  Trigger stress (daughter at [employer] wit anorexia, father ill)  Notes that being consistent with Botox, she was better.     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  In February 2016, she developed significant blood pressure variation, with associated strokelike symptoms, slurred speech, she was evaluated at [facility], was diagnosed with atypical left carotid artery aneurysm, had star close device in May 2016 by neurosurgeon Dr. Adela Glimpse at [facility], has been followed up regularly, which showed no recurrent aneurysm. She had significant elevated blood pressure since 2013, was found to have bilateral renal artery stenosis, was diagnosed with fibromuscular dysplasia, had bilateral renal artery angioplasty 3 times in the past, which only helped her blood pressure control temporarily, she continue have significant blood pressure variations, has been followed up by nephrologist at [facility].  She reported migraine headaches since 2015, sometimes preceding by visual aura, lasting for few hours, followed by lateralized severe pounding headache with light noise sensitivity, nauseous, her headache last about one day, since February 2017, she is having headache 2-3 times each week, she has tried tramadol without significant help.  Previously she tried Topamax, complains of numbness tingling of her hands, without helping her headache much, antidepression in the past, cause worsening mood swing,  Trigger for her migraines are weather change, stress, sleep deprivation, she work on night shift 7 PM to 7 AM 2 days  a week  She is now taking frequent over counter medication, Aleve Tylenol with limited help  UPDATE May 22 2016: She is with her daughter at today's clinical visit, she continued to have 3-4 headaches each week, vortex area severe pounding headache with associated light noise sensitivity, it can last for 2 days, she has tried Imitrex, Maxalt without helping her headaches, sleep usually help,  For preventive medications she has tried Topamax, without help, currently taking nortriptyline 20 mg every night, no significant side effect noticed.  We have personally reviewed MRI of the brain in August 2017, nonspecific white matter small vessel disease, MRA of the brain was normal  UPDATE Nov 29th 2017: This is her first Botox injection as migraine prevention, related question was answered, she still has migraine headache 3 times each week, lasting for 1- 2days, has tried Imitrex tablets, Maxalt dissolvable, Relpax, with limited help, insurance does not cover cambia, Fioricet, she is now taking tramadol as needed.  UPDATE Feb 7th 2018: Her headache has much improved with her first Botox injection on August 28 2016, at the peak of the benefit, she has less than one headache each week  She is taking tramadol, Zofran as needed, which helps her headaches some, but rarely took away her headaches.  UPDATE Aug 07 2017: Laboratory evaluation in July 2018 normal TSH, CMP, elevated cholesterol 231, LDL 143, normal CBC, hemoglobin of 13.7. She is dealing with a lots of stress at home, she complains of worsening headache, with BOTOX injection, she has 1-2 headache each week. Last injection was in Feb 2018.  Now she is having daily headache since Sept 2018, with visual auras, she takes tramadol prn, zofran, with liimited help.  Her headache lasted about one day.  Previously she has tried different preventive medication this including beta blocker, topamax, nortriptyline, effexor Abortive treatment, she has  tried Tylenol, Advil, imitrex, maxalt, Relpax, zomig without much help  REVIEW OF SYSTEMS: Full 14 system review of systems performed and notable only for as above  ALLERGIES: Allergies  Allergen Reactions  . Lisinopril Cough  . Metoprolol Other (See Comments)    did not feel well when taking     HOME MEDICATIONS: Current Outpatient Medications  Medication Sig Dispense Refill  . ALPRAZolam (XANAX) 0.5 MG tablet     . losartan-hydrochlorothiazide (HYZAAR) 100-25 MG tablet Take 1 tablet by mouth daily. 90 tablet 3  . Multiple Vitamin (MULTIVITAMIN WITH MINERALS) TABS tablet Take 1 tablet by mouth daily.    . ondansetron (ZOFRAN ODT) 4 MG disintegrating tablet Take 1 tablet (4 mg total) by mouth every 8 (eight) hours as needed. 30 tablet 11  . traMADol (ULTRAM) 50 MG tablet Take every 6 (six) hours as needed by mouth.     No current facility-administered medications for this visit.     PAST MEDICAL HISTORY: Past Medical History:  Diagnosis Date  . 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 Anxiety   . Hypertension   . Migraine   . PONV (postoperative nausea and vomiting)    hard to wake up after appendectomy  . Renal vein stenosis    "Kinked"    PAST SURGICAL HISTORY: Past Surgical History:  Procedure Laterality Date  . ABDOMINAL HYSTERECTOMY    . ANEURYSM SURGERY  01/2015  . APPENDECTOMY    . BTL  2006  . CESAREAN SECTION     Twins,  BTL  . RENAL ANGIOPLASTY      FAMILY HISTORY: Family History  Problem Relation Age of Onset  . Breast cancer Mother        breast   . Thyroid disease Mother   . Cancer Mother [id number]       breast  . Hyperlipidemia Mother   . Hypertension Father   . Stroke Father   . Melanoma Father        melanoma   . Cancer Father        melanoma  . Breast cancer Maternal Grandmother        breast   . Thyroid disease Maternal Grandmother   . Cancer Maternal Grandfather         unknown   . Cancer Paternal Grandmother        unknow     SOCIAL HISTORY:  Social History   Socioeconomic History  . Marital status: Married    Spouse name: Not on file  . Number of children: 4  . Years of education: HS+  . Highest education level: Not on file  Social Needs  . Financial resource strain: Not on file  . Food insecurity - worry: Not on file  . Food insecurity - inability: Not on file  . Transportation needs - medical: Not on file  . Transportation needs - non-medical: Not on file  Occupational History  . Occupation: Psychologist, sport and exercisenurse tech  Tobacco Use  . Smoking status: Never Smoker  . Smokeless tobacco: Never Used  Substance and Sexual Activity  . Alcohol use: Yes    Alcohol/week: 0.0 oz    Comment: 2-3 drinks per week  . Drug use: No  . Sexual activity: Yes    Partners: Male  Birth control/protection: Surgical  Other Topics Concern  . Not on file  Social History Narrative   Lives at home with husband and children.   Right-handed.   2-3 cups caffeine per week.     PHYSICAL EXAM   Vitals:   08/07/17 0845  BP: 128/88  Pulse: 71  SpO2: 99%  Weight: 152 lb 8 oz (69.2 kg)  Height: 5\' 3"  (1.6 m)    Not recorded      Body mass index is 27.01 kg/m.  PHYSICAL EXAMNIATION:  Gen: NAD, conversant, well nourised, obese, well groomed                     Cardiovascular: Regular rate rhythm, no peripheral edema, warm, nontender. Eyes: Conjunctivae clear without exudates or hemorrhage Neck: Supple, no carotid bruise. Pulmonary: Clear to auscultation bilaterally   NEUROLOGICAL EXAM:  MENTAL STATUS: Speech:    Speech is normal; fluent and spontaneous with normal comprehension.  Cognition:     Orientation to time, place and person     Normal recent and remote memory     Normal Attention span and concentration     Normal Language, naming, repeating,spontaneous speech     Fund of knowledge   CRANIAL NERVES: CN II: Visual fields are full to  confrontation. Fundoscopic exam is normal with sharp discs and no vascular changes. Pupils are round equal and briskly reactive to light. CN III, IV, VI: extraocular movement are normal. No ptosis. CN V: Facial sensation is intact to pinprick in all 3 divisions bilaterally. Corneal responses are intact.  CN VII: Face is symmetric with normal eye closure and smile. CN VIII: Hearing is normal to rubbing fingers CN IX, X: Palate elevates symmetrically. Phonation is normal. CN XI: Head turning and shoulder shrug are intact CN XII: Tongue is midline with normal movements and no atrophy.  MOTOR: There is no pronator drift of out-stretched arms. Muscle bulk and tone are normal. Muscle strength is normal.  REFLEXES: Reflexes are 2+ and symmetric at the biceps, triceps, knees, and ankles. Plantar responses are flexor.  SENSORY: Intact to light touch, pinprick, positional sensation and vibratory sensation are intact in fingers and toes.  COORDINATION: Rapid alternating movements and fine finger movements are intact. There is no dysmetria on finger-to-nose and heel-knee-shin.    GAIT/STANCE: Posture is normal. Gait is steady with normal steps, base, arm swing, and turning. Heel and toe walking are normal. Tandem gait is normal.  Romberg is absent.   DIAGNOSTIC DATA (LABS, IMAGING, TESTING) - I reviewed patient records, labs, notes, testing and imaging myself where available.   ASSESSMENT AND PLAN  Hampton Abboticole Nasworthy is a 41 y.o. female   Chronic migraine headaches History of left carotid artery aneurysm, status post Starstent placement  She has tried and failed multiple preventive medication including Topamax, nortriptyline, beta blocker, effexor, she has moderate improvement with BOTOX.  Now she has frequent migraine headaches with visual auras,  Will start her on zonegran 100mg  bid,   She has tried and failed Imitrex tablet, Maxalt dissolvable, Relpax, Fioricet, with limited help, tramadol  was help her some,  I am hesitate to try more triptan treatment due her left carotid artery aneurysm and stent placement  Ajovy SQ every 30 days as migraine prevention.  Cambia, zofran, tizanidine as needed.     Levert FeinsteinYijun Ilanna Deihl, M.D. Ph.D.  [facility]   [address], [address] [place], [place] [address] Ph: [phone] Fax: [phone]  CC:Donato SchultzYvonne R Lowne Chase,  DO

## 2017-11-10 ENCOUNTER — Encounter: Payer: Commercial Managed Care - PPO | Admitting: Women's Health

## 2017-11-11 ENCOUNTER — Ambulatory Visit: Payer: Commercial Managed Care - PPO | Admitting: Neurology

## 2017-12-15 NOTE — Telephone Encounter (Signed)
Error

## 2018-02-25 ENCOUNTER — Ambulatory Visit: Payer: Commercial Managed Care - PPO | Admitting: Clinical

## 2018-02-25 ENCOUNTER — Other Ambulatory Visit: Payer: Self-pay | Admitting: Neurology

## 2018-03-17 ENCOUNTER — Telehealth: Payer: Self-pay | Admitting: Neurology

## 2018-03-17 NOTE — Telephone Encounter (Signed)
I called [facility] and was informed Ajovy is processing through the patient's insurance with $0 co-pay.  No PA required.

## 2018-03-17 NOTE — Telephone Encounter (Signed)
Mikeila/CoverMyMeds [phone] REF# [id number] called to advise the PA for Fremanezumab-vfrm (AJOVY) 225 MG/1.5ML SOSY should to be faxed instead of going thru cover my meds.

## 2018-03-23 ENCOUNTER — Other Ambulatory Visit: Payer: Self-pay | Admitting: Family Medicine

## 2018-03-23 DIAGNOSIS — I10 Essential (primary) hypertension: Secondary | ICD-10-CM

## 2018-05-04 IMAGING — DX DG CHEST 1V PORT
1 series · 1 of 1 positions shown · non-contrast
Comparison: None.

CLINICAL DATA: chest pain

EXAM:
PORTABLE CHEST 1 VIEW

[chest ap]
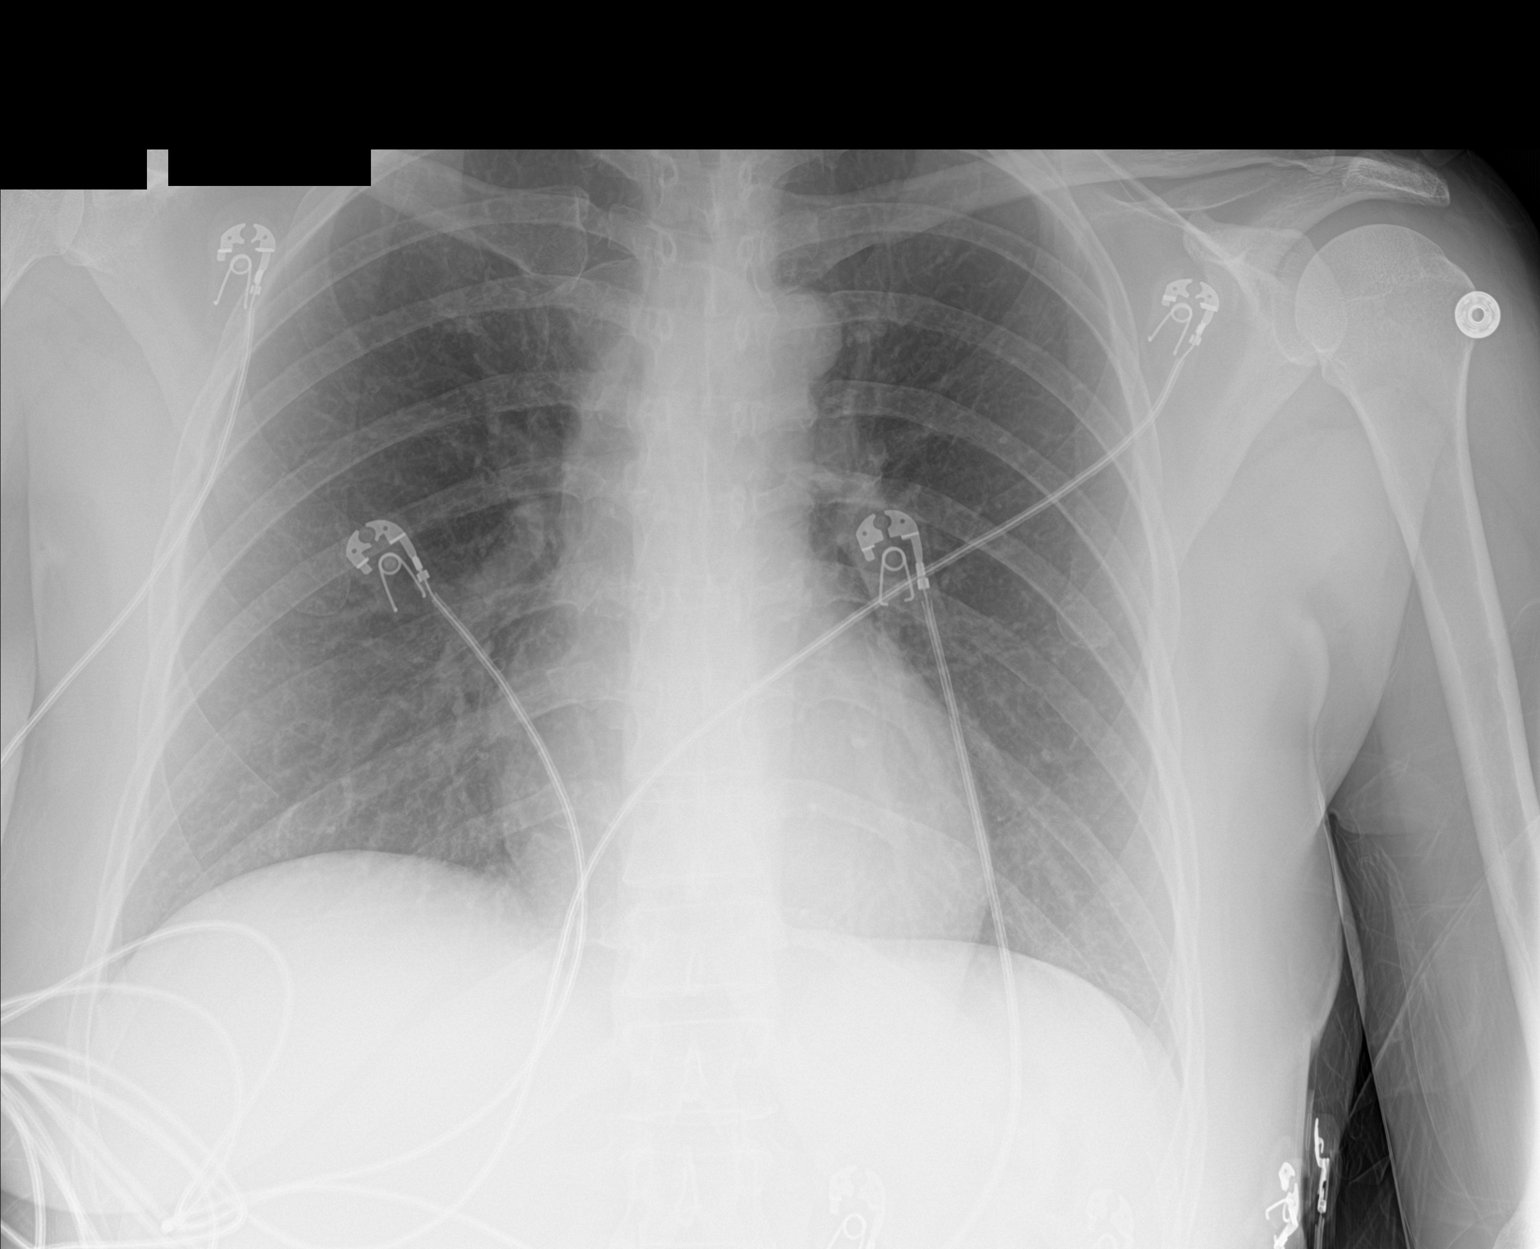

[1 of 1 positions shown; findings below may reference images not displayed]

FINDINGS: A single AP portable view of the chest demonstrates no focal
airspace consolidation or alveolar edema. The lungs are grossly
clear. There is no large effusion or pneumothorax. Cardiac and
mediastinal contours appear unremarkable.
IMPRESSION: No active disease.

## 2018-07-01 ENCOUNTER — Other Ambulatory Visit: Payer: Self-pay

## 2018-07-01 MED ORDER — PAROXETINE HCL 40 MG PO TABS: 80.0000 mg | ORAL_TABLET | ORAL | 1 refills | Status: DC

## 2018-07-08 ENCOUNTER — Ambulatory Visit (INDEPENDENT_AMBULATORY_CARE_PROVIDER_SITE_OTHER): Payer: Commercial Managed Care - PPO | Admitting: Psychiatry

## 2018-07-08 DIAGNOSIS — F4323 Adjustment disorder with mixed anxiety and depressed mood: Secondary | ICD-10-CM | POA: Diagnosis not present

## 2018-07-08 DIAGNOSIS — F331 Major depressive disorder, recurrent, moderate: Secondary | ICD-10-CM | POA: Diagnosis not present

## 2018-07-08 DIAGNOSIS — F4001 Agoraphobia with panic disorder: Secondary | ICD-10-CM | POA: Diagnosis not present

## 2018-07-08 DIAGNOSIS — F411 Generalized anxiety disorder: Secondary | ICD-10-CM | POA: Diagnosis not present

## 2018-07-08 MED ORDER — ARIPIPRAZOLE 5 MG PO TABS: 5.0000 mg | ORAL_TABLET | Freq: Every day | ORAL | 1 refills | Status: DC

## 2018-07-08 NOTE — Progress Notes (Signed)
[facility]  Patient ID: Jessica Molina,  MRN: [id number]  PCP: Donato Schultz, DO  Date of Evaluation: 07/08/2018 Time spent:30 minutes   HISTORY/CURRENT STATUS: HPI  A lot stress dealing with dau EDO severe.  Not much energy, can't go to gym has to watch her.  Gained a lot of weight.  Sleeping excessively except this week d/t stress. Dep and anxious d/t situation.  Other kids are angry about this. She won't eat at home   Mood in house better when she's in treatment.  Pt can then go to gym and shel'l feel better. Work fctn ok with pet sitting.  Occ hopeless, helpless.  Pushes herself. Tired but sleep fair.  Hard to shower.  No benefit from incrrease in paroxetine to 80 in April.  Occ panic incl yesterday.  Frequently.  Individual Medical History/ Review of Systems: Changes? : weight gain with poor diet and not exercise.  Allergies: Lisinopril and Metoprolol  Current Medications:  Current Outpatient Medications:  .  busPIRone (BUSPAR) 30 MG tablet, Take 30 mg by mouth 2 (two) times daily., Disp: , Rfl: 0 .  clonazePAM (KLONOPIN) 0.5 MG tablet, Take 0.5 mg by mouth 4 (four) times daily., Disp: , Rfl: 1 .  Fremanezumab-vfrm (AJOVY) 225 MG/1.5ML SOSY, Inject 225 mg every 30 (thirty) days into the skin., Disp: 1 mL, Rfl: 11 .  losartan-hydrochlorothiazide (HYZAAR) 100-25 MG tablet, TAKE 1 TABLET BY MOUTH EVERY DAY, Disp: 90 tablet, Rfl: 3 .  Multiple Vitamin (MULTIVITAMIN WITH MINERALS) TABS tablet, Take 1 tablet by mouth daily., Disp: , Rfl:  .  ondansetron (ZOFRAN ODT) 4 MG disintegrating tablet, Take 1 tablet (4 mg total) by mouth every 8 (eight) hours as needed., Disp: 30 tablet, Rfl: 11 .  PARoxetine (PAXIL) 40 MG tablet, Take 2 tablets (80 mg total) by mouth every morning. Needs office visit, Disp: 60 tablet, Rfl: 1 .  tiZANidine (ZANAFLEX) 4 MG tablet, Take 1 tablet (4 mg total) every 6 (six) hours as needed by mouth for muscle spasms., Disp: 30 tablet, Rfl:  11 .  Diclofenac Potassium (CAMBIA) 50 MG PACK, Take 50 mg as needed by mouth. (Patient not taking: Reported on 07/08/2018), Disp: 15 each, Rfl: 11 .  traMADol (ULTRAM) 50 MG tablet, Take 1 tablet (50 mg total) by mouth every 6 (six) hours as needed. Must last 30 days.  Call [phone] to schedule an appt to continue refills. (Patient not taking: Reported on 07/08/2018), Disp: 30 tablet, Rfl: 2 .  zonisamide (ZONEGRAN) 100 MG capsule, Take 1 capsule (100 mg total) 2 (two) times daily by mouth. (Patient not taking: Reported on 07/08/2018), Disp: 60 capsule, Rfl: 11 Medication Side Effects: Other: ? weight gain.; U hesitancy.  Family Medical/ Social History: Changes? Yes Dau with EDO and anxiety and req 24/7 care.  Will be going residential care....hopefully soon.  Pt poor diet.  Stopped alcohol bc feels bad.  MENTAL HEALTH EXAM:  Last menstrual period 06/19/2016.There is no height or weight on file to calculate BMI.  General Appearance: Casual  Eye Contact:  Good  Speech:  Clear and Coherent  Volume:  Normal  Mood:  Anxious and Depressed  Affect:  Constricted  Thought Process:  Goal Directed  Orientation:  Full (Time, Place, and Person)  Thought Content: Rumination   Suicidal Thoughts:  No  Homicidal Thoughts:  No  Memory:  Recent  Judgement:  Good  Insight:  Good  Psychomotor Activity:  Normal  Concentration:  Concentration: Good  Recall:  Good  Fund of Knowledge: Good  Language: Good  Akathisia:  No  AIMS (if indicated): not done  Assets:  Communication Skills Desire for Improvement Vocational/Educational  ADL's:  Intact  Cognition: WNL  Prognosis:  Fair    DIAGNOSES:    ICD-10-CM   1. Panic disorder with agoraphobia F40.01   2. Generalized anxiety disorder F41.1   3. Major depressive disorder, recurrent episode, moderate (HCC) F33.1   4. Adjustment disorder with mixed anxiety and depressed mood F43.23     RECOMMENDATIONS: Reduce paroxetine d/t no benefit with increase  and SE to 60mg /D Change buspirone to Abilify 5mg /D  D takes it too.  Disc SE. Support self care.  Disc boundary and strategy dealing with D with severe EDO Rec cont psychotherapy....goinng next week.     Lauraine Rinne, MD

## 2018-07-08 NOTE — Patient Instructions (Signed)
Stop buspirone over 5 days. Start 1/2 airpiprazole daily for 7 days and if needed increase to 1 daily.

## 2018-07-17 ENCOUNTER — Other Ambulatory Visit: Payer: Self-pay | Admitting: Neurology

## 2018-07-20 NOTE — Telephone Encounter (Signed)
Registry checked. Last fill date is 06/09/18 for #30. Last OV was 08/07/17 and no follow up scheduled.

## 2018-07-24 ENCOUNTER — Telehealth: Payer: Self-pay | Admitting: *Deleted

## 2018-07-24 MED ORDER — HYDROCHLOROTHIAZIDE 25 MG PO TABS: 25.0000 mg | ORAL_TABLET | Freq: Every day | ORAL | 1 refills | Status: DC

## 2018-07-24 MED ORDER — LOSARTAN POTASSIUM 100 MG PO TABS: 100.0000 mg | ORAL_TABLET | Freq: Every day | ORAL | 1 refills | Status: DC

## 2018-07-24 NOTE — Telephone Encounter (Signed)
Left message on machine that medications has been sent in.

## 2018-07-24 NOTE — Telephone Encounter (Signed)
Copied from CRM [id number]. Topic: Quick Communication - See Telephone Encounter >> Jul 22, 2018 11:23 AM Herby Abraham C wrote: CRM for notification. See Telephone encounter for: 07/22/18.  Pt says that she was advised by her pharmacy that her medication losartan-hydrochlorothiazide (HYZAAR) 100-25 MG tablet  is on back order. She said that they have medication in stock separately. Pt is requesting a 2 prescriptions.

## 2018-08-03 ENCOUNTER — Telehealth: Payer: Self-pay | Admitting: Neurology

## 2018-08-03 ENCOUNTER — Other Ambulatory Visit: Payer: Self-pay | Admitting: Neurology

## 2018-08-03 NOTE — Progress Notes (Signed)
PATIENT: Jessica Molina DOB: 06-Aug-1976  Chief Complaint  Patient presents with  . Follow-up  . Migraine    needs refill tramadol, last botox in 10/2016.  Having daily headaches  Has headache today.  Trigger stress (daughter at [employer] wit anorexia, father ill)  Notes that being consistent with Botox, she was better.     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  In February 2016, she developed significant blood pressure variation, with associated strokelike symptoms, slurred speech, she was evaluated at [facility], was diagnosed with atypical left carotid artery aneurysm, had star close device in May 2016 by neurosurgeon Dr. Adela Glimpse at [facility], has been followed up regularly, which showed no recurrent aneurysm. She had significant elevated blood pressure since 2013, was found to have bilateral renal artery stenosis, was diagnosed with fibromuscular dysplasia, had bilateral renal artery angioplasty 3 times in the past, which only helped her blood pressure control temporarily, she continue have significant blood pressure variations, has been followed up by nephrologist at [facility].  She reported migraine headaches since 2015, sometimes preceding by visual aura, lasting for few hours, followed by lateralized severe pounding headache with light noise sensitivity, nauseous, her headache last about one day, since February 2017, she is having headache 2-3 times each week, she has tried tramadol without significant help.  Previously she tried Topamax, complains of numbness tingling of her hands, without helping her headache much, antidepression in the past, cause worsening mood swing,  Trigger for her migraines are weather change, stress, sleep deprivation, she work on night shift 7 PM to 7 AM 2 days  a week  She is now taking frequent over counter medication, Aleve Tylenol with limited help  UPDATE May 22 2016: She is with her daughter at today's clinical visit, she continued to have 3-4 headaches each week, vortex area severe pounding headache with associated light noise sensitivity, it can last for 2 days, she has tried Imitrex, Maxalt without helping her headaches, sleep usually help,  For preventive medications she has tried Topamax, without help, currently taking nortriptyline 20 mg every night, no significant side effect noticed.  We have personally reviewed MRI of the brain in August 2017, nonspecific white matter small vessel disease, MRA of the brain was normal  UPDATE Nov 29th 2017: This is her first Botox injection as migraine prevention, related question was answered, she still has migraine headache 3 times each week, lasting for 1- 2days, has tried Imitrex tablets, Maxalt dissolvable, Relpax, with limited help, insurance does not cover cambia, Fioricet, she is now taking tramadol as needed.  UPDATE Feb 7th 2018 YY: Her headache has much improved with her first Botox injection on August 28 2016, at the peak of the benefit, she has less than one headache each week  She is taking tramadol, Zofran as needed, which helps her headaches some, but rarely took away her headaches.  UPDATE Aug 07 2017 YY:  elevated cholesterol 231, LDL 143, normal CBC, hemoglobin of 13.7. She is dealing with a lots of stress at home, she complains of worsening headache, with BOTOX injection, she has 1-2 headache each week. Last injection was in Feb 2018.  Now she is having daily headache since Sept 2018, with visual auras, she takes tramadol prn, zofran, with liimited help. Her headache lasted about one  day.  Previously she has tried different preventive medication this including beta blocker, topamax, nortriptyline, effexor Abortive treatment, she has tried Tylenol, Advil, imitrex, maxalt,  Relpax, zomig without much help    Interval history 08/04/2018: Patient is being seen today for migraine follow-up and medication management.  After prior visit 1 year ago, patient was started on Ajovy along with zonisamide for better migraine management.  She has continued taking the Ajovy but has stopped the zonisamide as she states she was not seeing benefit of continuing. She does state that her migraines have improved with 1-2 migraines per week which last approx. 8-12 hours which are associated with photophobia, phonophobia, and N/V.  She does state that the headache duration and intensity has greatly improved.  When migraine onset, she will take the tizanidine and if she continues to have a migraine hours after use of tizanidine, she will take tramadol which will usually break her migraine.  She does state that she last filled tramadol on 07/20/2018 but while cleaning, the pills accidentally fell into the toilet.  Review of PMP shows recent refills on 03/25/2018, 06/09/2018 and 07/20/2018.  She does endorse being under a great deal of stress at this time due to family medical issues.  No further concerns at this time.     REVIEW OF SYSTEMS: Full 14 system review of systems performed and notable only for as above  ALLERGIES: Allergies  Allergen Reactions  . Lisinopril Cough  . Metoprolol Other (See Comments)    did not feel well when taking     Allergies as of 08/04/2018      Reactions   Lisinopril Cough   Metoprolol Other (See Comments)   did not feel well when taking       Medication List        Accurate as of 08/04/18 10:21 AM. Always use your most recent med list.          ARIPiprazole 5 MG tablet Commonly known as:  ABILIFY Take 1 tablet (5 mg total) by mouth daily.   clonazePAM 0.5 MG tablet Commonly known as:  KLONOPIN Take 0.5 mg by mouth 4 (four) times daily.   clopidogrel 75 MG tablet Commonly known as:  PLAVIX once daily.   Fremanezumab-vfrm 225 MG/1.5ML  Sosy Inject 225 mg into the skin every 30 (thirty) days.   hydrochlorothiazide 25 MG tablet Commonly known as:  HYDRODIURIL Take 1 tablet (25 mg total) by mouth daily.   losartan 100 MG tablet Commonly known as:  COZAAR Take 1 tablet (100 mg total) by mouth daily.   multivitamin with minerals Tabs tablet Take 1 tablet by mouth daily.   tiZANidine 4 MG tablet Commonly known as:  ZANAFLEX Take 1 tablet (4 mg total) by mouth every 6 (six) hours as needed for muscle spasms.   traMADol 50 MG tablet Commonly known as:  ULTRAM TAKE 1 TABLET BY MOUTH EVERY 6 HRS AS NEEDED. MUST LAST 30 DAYS       No current facility-administered medications for this visit.     PAST MEDICAL HISTORY: Past Medical History:  Diagnosis Date  . 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 Anxiety   . Hypertension   . Migraine   . PONV (postoperative nausea and vomiting)    hard to wake up after appendectomy  . Renal vein stenosis    "Kinked"    PAST SURGICAL HISTORY: Past Surgical History:  Procedure Laterality  Date  . ABDOMINAL HYSTERECTOMY    . ANEURYSM SURGERY  01/2015  . APPENDECTOMY    . BTL  2006  . CESAREAN SECTION     Twins,  BTL  . RENAL ANGIOPLASTY      FAMILY HISTORY: Family History  Problem Relation Age of Onset  . Breast cancer Mother        breast   . Thyroid disease Mother   . Cancer Mother 69       breast  . Hyperlipidemia Mother   . Hypertension Father   . Stroke Father   . Melanoma Father        melanoma   . Cancer Father        melanoma  . Breast cancer Maternal Grandmother        breast   . Thyroid disease Maternal Grandmother   . Cancer Maternal Grandfather        unknown   . Cancer Paternal Grandmother        unknow     SOCIAL HISTORY:  Social History   Socioeconomic History  . Marital status: Married    Spouse name: Not on file  . Number of children: 4  . Years of education: HS+  .  Highest education level: Not on file  Social Needs  . Financial resource strain: Not on file  . Food insecurity - worry: Not on file  . Food insecurity - inability: Not on file  . Transportation needs - medical: Not on file  . Transportation needs - non-medical: Not on file  Occupational History  . Occupation: Psychologist, sport and exercise  Tobacco Use  . Smoking status: Never Smoker  . Smokeless tobacco: Never Used  Substance and Sexual Activity  . Alcohol use: Yes    Alcohol/week: 0.0 oz    Comment: 2-3 drinks per week  . Drug use: No  . Sexual activity: Yes    Partners: Male    Birth control/protection: Surgical  Other Topics Concern  . Not on file  Social History Narrative   Lives at home with husband and children.   Right-handed.   2-3 cups caffeine per week.     PHYSICAL EXAM   Today's Vitals   08/04/18 0833  BP: 121/81  Pulse: 90  Weight: 188 lb (85.3 kg)  Height: 5\' 3"  (1.6 m)   Body mass index is 33.3 kg/m.   Body mass index is 33.3 kg/m.   PHYSICAL EXAMNIATION:  Gen: NAD, pleasant middle-aged Caucasian female, conversant, well nourised, obese, well groomed                     Cardiovascular: Regular rate rhythm, no peripheral edema, warm, nontender. Eyes: Conjunctivae clear without exudates or hemorrhage Neck: Supple, no carotid bruise. Pulmonary: Clear to auscultation bilaterally   NEUROLOGICAL EXAM:  MENTAL STATUS: Speech:    Speech is normal; fluent and spontaneous with normal comprehension.  Cognition:     Orientation to time, place and person     Normal recent and remote memory     Normal Attention span and concentration     Normal Language, naming, repeating,spontaneous speech     Fund of knowledge   CRANIAL NERVES: CN II: Visual fields are full to confrontation. Fundoscopic exam is normal with sharp discs and no vascular changes. Pupils are round equal and briskly reactive to light. CN III, IV, VI: extraocular movement are normal. No ptosis. CN V:  Facial sensation is intact to pinprick in all 3 divisions  bilaterally. Corneal responses are intact.  CN VII: Face is symmetric with normal eye closure and smile. CN VIII: Hearing is normal to rubbing fingers CN IX, X: Palate elevates symmetrically. Phonation is normal. CN XI: Head turning and shoulder shrug are intact CN XII: Tongue is midline with normal movements and no atrophy.  MOTOR: There is no pronator drift of out-stretched arms. Muscle bulk and tone are normal. Muscle strength is normal.  REFLEXES: Reflexes are 2+ and symmetric at the biceps, triceps, knees, and ankles. Plantar responses are flexor.  SENSORY: Intact to light touch, pinprick, positional sensation and vibratory sensation are intact in fingers and toes.  COORDINATION: Rapid alternating movements and fine finger movements are intact. There is no dysmetria on finger-to-nose and heel-knee-shin.    GAIT/STANCE: Posture is normal. Gait is steady with normal steps, base, arm swing, and turning. Heel and toe walking are normal. Tandem gait is normal.  Romberg is absent.   DIAGNOSTIC DATA (LABS, IMAGING, TESTING) - I reviewed patient records, labs, notes, testing and imaging myself where available.   ASSESSMENT AND PLAN  Jessica Molina is a 42 y.o. female   Chronic migraine headaches History of left carotid artery aneurysm, status post Starstent placement  At this time, recommended to continue Ajovy monthly along with continued use of tizanidine and tramadol as needed.  After speaking with Dr. Terrace Arabia regarding early refill of tramadol, it was agreed upon to refill based on PMP reports.  Refill provided for all current migraine medications along with 1 month refill of tramadol.  She does plan on being seen by therapist due to increased stressors at home which will greatly benefit her.  Advised that if migraines worsen with continued use of current regimen, can consider change of treatment at that time or reintroducing  Botox injections.  Patient will follow-up in 1 year or call earlier if needed with questions, concerns or need of future follow-up appointment.   Greater than 50% of time during this 25 minute visit was spent on counseling,explanation of diagnosis of migraines, discussing current treatment plan, planning of further management, discussion with patient and family and coordination of care     George Hugh, [facility]  [facility] [address] [place], [place] [address]  Phone [phone] Fax [phone] Note: This document was prepared with digital dictation and possible smart phrase technology. Any transcriptional errors that result from this process are unintentional.

## 2018-08-03 NOTE — Telephone Encounter (Signed)
Pt states she was told by her pharmacy  [id number] - [place], [place] - [address]  To call for her refill on her tiZANidine (ZANAFLEX) 4 MG tablet, please call

## 2018-08-03 NOTE — Telephone Encounter (Addendum)
Patient last seen 07/2017 with instructions to follow up in three months.  She needs appointment for medication refills.  Spoke to patient to schedule.  She will come in 08/04/18 to see Shanda Bumps, NP.

## 2018-08-04 ENCOUNTER — Encounter: Payer: Self-pay | Admitting: Adult Health

## 2018-08-04 ENCOUNTER — Ambulatory Visit (INDEPENDENT_AMBULATORY_CARE_PROVIDER_SITE_OTHER): Payer: Commercial Managed Care - PPO | Admitting: Adult Health

## 2018-08-04 ENCOUNTER — Other Ambulatory Visit: Payer: Self-pay | Admitting: Psychiatry

## 2018-08-04 VITALS — BP 121/81 | HR 90 | Ht 63.0 in | Wt 188.0 lb

## 2018-08-04 DIAGNOSIS — I10 Essential (primary) hypertension: Secondary | ICD-10-CM

## 2018-08-04 DIAGNOSIS — G43709 Chronic migraine without aura, not intractable, without status migrainosus: Secondary | ICD-10-CM | POA: Diagnosis not present

## 2018-08-04 MED ORDER — FREMANEZUMAB-VFRM 225 MG/1.5ML ~~LOC~~ SOSY: 225.0000 mg | PREFILLED_SYRINGE | SUBCUTANEOUS | 11 refills | Status: DC

## 2018-08-04 MED ORDER — TRAMADOL HCL 50 MG PO TABS: ORAL_TABLET | ORAL | 0 refills | Status: DC

## 2018-08-04 MED ORDER — TIZANIDINE HCL 4 MG PO TABS: 4.0000 mg | ORAL_TABLET | Freq: Four times a day (QID) | ORAL | 11 refills | Status: DC | PRN

## 2018-08-04 NOTE — Patient Instructions (Addendum)
Your Plan:  Continue Ajovy monthly as this has been helping  Continue zanaflex and tramadol as needed Refills placed for all migraine medications s Consider botox in the future if needed in addition to Ajovy   Follow up in 1 year or call earlier if needed      Thank you for coming to see [place] at [facility]. I hope we have been able to provide you high quality care today.  You may receive a patient satisfaction survey over the next few weeks. We would appreciate your feedback and comments so that we may continue to improve ourselves and the health of our patients.

## 2018-08-12 ENCOUNTER — Ambulatory Visit (INDEPENDENT_AMBULATORY_CARE_PROVIDER_SITE_OTHER): Payer: Commercial Managed Care - PPO | Admitting: Mental Health

## 2018-08-12 ENCOUNTER — Ambulatory Visit: Payer: Self-pay | Admitting: *Deleted

## 2018-08-12 ENCOUNTER — Telehealth: Payer: Self-pay | Admitting: Medical

## 2018-08-12 DIAGNOSIS — F411 Generalized anxiety disorder: Secondary | ICD-10-CM | POA: Diagnosis not present

## 2018-08-12 DIAGNOSIS — F331 Major depressive disorder, recurrent, moderate: Secondary | ICD-10-CM

## 2018-08-12 NOTE — Telephone Encounter (Signed)
With he described low bp, high pulse and if not eating my first thoughts are is she dehydrated and does she needs fluids. If not eating much she may have electrolyte abnoramlity such as low k. If abdomen hard may need abd xray to assess stool volume or ct. So maybe ED better option as she can get hydration if needed and stat labs/imaging. Rather than waiting until tomorrow.  See what she says. This would be my somewhat my reasonig tomorrow and I may advise ED after eval tomorrow. Just wanted pt to be aware. And offer option.

## 2018-08-12 NOTE — Telephone Encounter (Signed)
Pt called with complaints of feeling like she has to burp and her stomach is swollen, heart burn and she can eat a few bites of regular food, and then she throws up and the food feels like cotton in her throat; her GI symptoms started on 08/10/18;  she also reports ankle swelling that she noticed today 08/12/18; she us most concerned with the GI symptoms; the pt says that she has had diarrhea and normal bowel movements for the past couple of weeks; she also says that her stomach is hard and she looks 6 months pregnant; the pt says that she is nauseous; she has tried gas relief and zofran which have not helped; recommendations made per nurse triage; she says that she is not available for an appointment until the morning of  08/13/18; she normally see Dr Laury AxonLowne but provider has no availability within the pt's time constraints; pt offered and accepted appointment with [facility]per[facility]ier, [facility], 08/13/18 at 0920; she verbalized understanding; will route to office for notification of this upcoming appointment.  Reason for Disposition . [1] MODERATE pain (e.g., interferes with normal activities) AND [2] pain comes and goes (cramps) AND [3] present > 24 hours  (Exception: pain with Vomiting or Diarrhea - see that Guideline)  Answer Assessment - Initial Assessment Questions 1. LOCATION: "Where does it hurt?"      Upper abdomen above belly button 2. RADIATION: "Does the pain shoot anywhere else?" (e.g., chest, back)     Up into chest 3. ONSET: "When did the pain begin?" (e.g., minutes, hours or days ago)      08/10/18 4. SUDDEN: "Gradual or sudden onset?"     gradual 5. PATTERN "Does the pain come and go, or is it constant?"    - If constant: "Is it getting better, staying the same, or worsening?"      (Note: Constant means the pain never goes away completely; most serious pain is constant and it progresses)     - If intermittent: "How long does it last?" "Do you have pain now?"     (Note: Intermittent  means the pain goes away completely between bouts)     intermittent 6. SEVERITY: "How bad is the pain?"  (e.g., Scale 1-10; mild, moderate, or severe)   - MILD (1-3): doesn't interfere with normal activities, abdomen soft and not tender to touch    - MODERATE (4-7): interferes with normal activities or awakens from sleep, tender to touch    - SEVERE (8-10): excruciating pain, doubled over, unable to do any normal activities     Moderate to severe; severe 08/11/18 7. RECURRENT SYMPTOM: "Have you ever had this type of abdominal pain before?" If so, ask: "When was the last time?" and "What happened that time?"      no 8. CAUSE: "What do you think is causing the abdominal pain?"     Not sure 9. RELIEVING/AGGRAVATING FACTORS: "What makes it better or worse?" (e.g., movement, antacids, bowel movement)     Nothing makes better; eating or moving too much makes it worse 10. OTHER SYMPTOMS: "Has there been any vomiting, diarrhea, constipation, or urine problems?"       Heart burn, abdomen distended and hard 11. PREGNANCY: "Is there any chance you are pregnant?" "When was your last menstrual period?"       No hysterectomy  Protocols used: [facility]

## 2018-08-12 NOTE — Progress Notes (Signed)
I have reviewed and agreed above plan. 

## 2018-08-12 NOTE — Progress Notes (Signed)
[facility] Initial Adult Exam- Part I  Name: Jessica Molina Date: 08/12/2018 MRN: [id number] DOB: 1976/09/29 PCP: Donato Schultz, DO  Time spent: 45 minutes   Guardian/Payee: patient   Paperwork requested:  No   Reason for Visit /Presenting Problem: Anxiety and depression. Social isolation.  Mental Status Exam:   Appearance:   Casual     Behavior:  Appropriate  Motor:  Normal  Speech/Language:   Clear and Coherent  Affect:  Depressed and anxious  Mood:  anxious and depressed  Thought process:  discourged  Thought content:    worry  Sensory/Perceptual disturbances:    WNL  Orientation:  oriented to person, place and time/date  Attention:  Good  Concentration:  Good  Memory:  WNL  Fund of knowledge:   Good  Insight:    Good  Judgment:   Good  Impulse Control:  Good   Reported Symptoms:  Panic attacks and Sleep disturbance, isolation, anxiety and depression, tearfulness  Risk Assessment: Danger to Self:  No Self-injurious Behavior: No Danger to Others: No Duty to Warn:no Physical Aggression / Violence:No  Access to Firearms a concern: No  Gang Involvement:No  Patient / guardian was educated about steps to take if suicide or homicide risk level increases between visits: no While future psychiatric events cannot be accurately predicted, the patient does not currently require acute inpatient psychiatric care and does not currently meet [facility] involuntary commitment criteria.  Substance Abuse History: Current substance abuse: No     Past Psychiatric History:   Previous psychological history is significant for anxiety and depression Outpatient Providers:[facility] History of Psych Hospitalization: Yes  Psychological Testing: Not recently    Medical History/Surgical History:reviewed Past Medical History:  Diagnosis Date  . 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 Anxiety   .  Hypertension   . Migraine   . PONV (postoperative nausea and vomiting)    hard to wake up after appendectomy  . Renal vein stenosis    "Kinked"    Past Surgical History:  Procedure Laterality Date  . ABDOMINAL HYSTERECTOMY    . ANEURYSM SURGERY  01/2015  . APPENDECTOMY    . BTL  2006  . CESAREAN SECTION     Twins,  BTL  . CYSTOSCOPY N/A 06/25/2016   Procedure: CYSTOSCOPY;  Surgeon: Dara Lords, MD;  Location: [facility];  Service: Gynecology;  Laterality: N/A;  . LAPAROSCOPIC VAGINAL HYSTERECTOMY WITH SALPINGECTOMY Bilateral 06/25/2016   Procedure: LAPAROSCOPIC ASSISTED VAGINAL HYSTERECTOMY WITH Bilateral SALPINGECTOMY, Lysis of Adhesions;  Surgeon: Dara Lords, MD;  Location: [facility];  Service: Gynecology;  Laterality: Bilateral;  . RENAL ANGIOPLASTY      Medications: Current Outpatient Medications  Medication Sig Dispense Refill  . ARIPiprazole (ABILIFY) 5 MG tablet TAKE 1 TABLET BY MOUTH EVERY DAY 90 tablet 0  . clonazePAM (KLONOPIN) 0.5 MG tablet Take 0.5 mg by mouth 4 (four) times daily.  1  . clopidogrel (PLAVIX) 75 MG tablet once daily.    . Fremanezumab-vfrm (AJOVY) 225 MG/1.5ML SOSY Inject 225 mg into the skin every 30 (thirty) days. 1 mL 11  . hydrochlorothiazide (HYDRODIURIL) 25 MG tablet Take 1 tablet (25 mg total) by mouth daily. 90 tablet 1  . losartan (COZAAR) 100 MG tablet Take 1 tablet (100 mg total) by mouth daily. 90 tablet 1  . Multiple Vitamin (MULTIVITAMIN WITH MINERALS) TABS tablet Take 1 tablet by mouth daily.    Marland Kitchen  tiZANidine (ZANAFLEX) 4 MG tablet Take 1 tablet (4 mg total) by mouth every 6 (six) hours as needed for muscle spasms. 30 tablet 11  . traMADol (ULTRAM) 50 MG tablet TAKE 1 TABLET BY MOUTH EVERY 6 HRS AS NEEDED. MUST LAST 30 DAYS 30 tablet 0   No current facility-administered medications for this visit.     Allergies  Allergen Reactions  . Lisinopril Cough  . Metoprolol Other (See Comments)    did not feel well when taking      Diagnoses:    ICD-10-CM   1. Generalized anxiety disorder F41.1   2. Major depressive disorder, recurrent episode, moderate (HCC) F33.1      Part II to be continued at next session:   No.   Ulice Boldarson Xzavier Swinger, LPC

## 2018-08-12 NOTE — Telephone Encounter (Signed)
Author phoned pt. to f/u on sx. Pt. states she continues to have frequent loose BMs, but still in discomfort. Pt. stated she had 2-3 episodes of bowel incontinence last week, which has never happened in the past. Pt. sees a neurologist for migraines and to f/u s/p pseudo aneurysym. When asked, pt. does endorse bilateral hand numbness/tingling, which is also new.  Author asked pt. to check VS, and pt. stated her BP right now is 104/66 with pulse of 110. Pt. stated she has been checking regularly past few days, and she has had readings like this:  87/57, P=75;    119/73, P=113. Pt. states she knows how to check her radial pulse and the readings on her machine match up to what she gets with palpation. At this point, pt. is afraid to eat or drink anything. Routed to Ramon DredgeEdward and Carroll Sage. Paz, DOD, to advise further. Pt. Has appointment with Ramon DredgeEdward 11/14 at 0920AM.

## 2018-08-12 NOTE — Telephone Encounter (Signed)
Please my note in response to notification she was coming in tomorrow. Not sure if anyone called her today. So if someone could reach out to her between 8-9 am and communicate my thoughts. As she might save some time in the event ED evaluation more appropiate.

## 2018-08-13 ENCOUNTER — Observation Stay (HOSPITAL_BASED_OUTPATIENT_CLINIC_OR_DEPARTMENT_OTHER)
Admission: EM | Admit: 2018-08-13 | Discharge: 2018-08-16 | Disposition: A | Discharge status: Home / Self Care | Payer: Commercial Managed Care - PPO | Attending: Internal Medicine | Admitting: Internal Medicine

## 2018-08-13 ENCOUNTER — Encounter (HOSPITAL_BASED_OUTPATIENT_CLINIC_OR_DEPARTMENT_OTHER): Payer: Self-pay | Admitting: *Deleted

## 2018-08-13 ENCOUNTER — Telehealth: Payer: Self-pay | Admitting: Medical

## 2018-08-13 ENCOUNTER — Ambulatory Visit (HOSPITAL_BASED_OUTPATIENT_CLINIC_OR_DEPARTMENT_OTHER)
Admission: RE | Admit: 2018-08-13 | Discharge: 2018-08-13 | Disposition: A | Discharge status: Home / Self Care | Payer: Commercial Managed Care - PPO | Source: Ambulatory Visit | Attending: Medical | Admitting: Medical

## 2018-08-13 ENCOUNTER — Ambulatory Visit (INDEPENDENT_AMBULATORY_CARE_PROVIDER_SITE_OTHER): Payer: Commercial Managed Care - PPO | Admitting: Medical

## 2018-08-13 ENCOUNTER — Emergency Department (HOSPITAL_BASED_OUTPATIENT_CLINIC_OR_DEPARTMENT_OTHER): Payer: Commercial Managed Care - PPO

## 2018-08-13 ENCOUNTER — Other Ambulatory Visit (INDEPENDENT_AMBULATORY_CARE_PROVIDER_SITE_OTHER): Payer: Commercial Managed Care - PPO

## 2018-08-13 ENCOUNTER — Other Ambulatory Visit: Payer: Self-pay | Admitting: Women's Health

## 2018-08-13 ENCOUNTER — Other Ambulatory Visit: Payer: Self-pay

## 2018-08-13 ENCOUNTER — Encounter: Payer: Self-pay | Admitting: Medical

## 2018-08-13 ENCOUNTER — Ambulatory Visit (HOSPITAL_BASED_OUTPATIENT_CLINIC_OR_DEPARTMENT_OTHER)
Admission: RE | Admit: 2018-08-13 | Discharge: 2018-08-13 | Disposition: A | Discharge status: Home / Self Care | Payer: Commercial Managed Care - PPO | Source: Ambulatory Visit | Attending: Women's Health | Admitting: Women's Health

## 2018-08-13 VITALS — BP 109/71 | HR 95 | Temp 98.2°F | Resp 16 | Ht 63.0 in | Wt 188.2 lb

## 2018-08-13 DIAGNOSIS — R109 Unspecified abdominal pain: Secondary | ICD-10-CM

## 2018-08-13 DIAGNOSIS — K219 Gastro-esophageal reflux disease without esophagitis: Secondary | ICD-10-CM | POA: Diagnosis not present

## 2018-08-13 DIAGNOSIS — M79662 Pain in left lower leg: Secondary | ICD-10-CM | POA: Diagnosis not present

## 2018-08-13 DIAGNOSIS — R0789 Other chest pain: Principal | ICD-10-CM | POA: Insufficient documentation

## 2018-08-13 DIAGNOSIS — M79661 Pain in right lower leg: Secondary | ICD-10-CM

## 2018-08-13 DIAGNOSIS — Z79899 Other long term (current) drug therapy: Secondary | ICD-10-CM | POA: Insufficient documentation

## 2018-08-13 DIAGNOSIS — Z1231 Encounter for screening mammogram for malignant neoplasm of breast: Secondary | ICD-10-CM

## 2018-08-13 DIAGNOSIS — R600 Localized edema: Secondary | ICD-10-CM

## 2018-08-13 DIAGNOSIS — R079 Chest pain, unspecified: Secondary | ICD-10-CM | POA: Diagnosis present

## 2018-08-13 DIAGNOSIS — R1310 Dysphagia, unspecified: Secondary | ICD-10-CM

## 2018-08-13 DIAGNOSIS — N179 Acute kidney failure, unspecified: Secondary | ICD-10-CM | POA: Insufficient documentation

## 2018-08-13 DIAGNOSIS — Z7982 Long term (current) use of aspirin: Secondary | ICD-10-CM | POA: Insufficient documentation

## 2018-08-13 DIAGNOSIS — R197 Diarrhea, unspecified: Secondary | ICD-10-CM

## 2018-08-13 DIAGNOSIS — R1110 Vomiting, unspecified: Secondary | ICD-10-CM

## 2018-08-13 DIAGNOSIS — E876 Hypokalemia: Secondary | ICD-10-CM | POA: Insufficient documentation

## 2018-08-13 DIAGNOSIS — Z888 Allergy status to other drugs, medicaments and biological substances status: Secondary | ICD-10-CM | POA: Insufficient documentation

## 2018-08-13 DIAGNOSIS — I10 Essential (primary) hypertension: Secondary | ICD-10-CM | POA: Diagnosis present

## 2018-08-13 DIAGNOSIS — F419 Anxiety disorder, unspecified: Secondary | ICD-10-CM | POA: Insufficient documentation

## 2018-08-13 DIAGNOSIS — G9341 Metabolic encephalopathy: Secondary | ICD-10-CM | POA: Diagnosis not present

## 2018-08-13 DIAGNOSIS — R7989 Other specified abnormal findings of blood chemistry: Secondary | ICD-10-CM | POA: Diagnosis present

## 2018-08-13 DIAGNOSIS — R252 Cramp and spasm: Secondary | ICD-10-CM

## 2018-08-13 LAB — CBC WITH DIFFERENTIAL/PLATELET
Abs Immature Granulocytes: 0.02 10*3/uL (ref 0.00–0.07)
Basophils Absolute: 0.1 10*3/uL (ref 0.0–0.1)
Basophils Absolute: 0.1 10*3/uL (ref 0.0–0.1)
Basophils Relative: 0.8 % (ref 0.0–3.0)
Basophils Relative: 1 %
Eosinophils Absolute: 0.1 10*3/uL (ref 0.0–0.7)
Eosinophils Absolute: 0.1 10*3/uL (ref 0.0–0.5)
Eosinophils Relative: 0.9 % (ref 0.0–5.0)
Eosinophils Relative: 1 %
HCT: 36.6 % (ref 36.0–46.0)
HCT: 35.3 % — ABNORMAL LOW (ref 36.0–46.0)
Hemoglobin: 12.7 g/dL (ref 12.0–15.0)
Hemoglobin: 12 g/dL (ref 12.0–15.0)
Immature Granulocytes: 0 %
Lymphocytes Relative: 38.3 % (ref 12.0–46.0)
Lymphocytes Relative: 30 %
Lymphs Abs: 2.7 10*3/uL (ref 0.7–4.0)
Lymphs Abs: 2.1 10*3/uL (ref 0.7–4.0)
MCH: 30.5 pg (ref 26.0–34.0)
MCHC: 34.8 g/dL (ref 30.0–36.0)
MCHC: 34 g/dL (ref 30.0–36.0)
MCV: 90.7 fl (ref 78.0–100.0)
MCV: 89.8 fL (ref 80.0–100.0)
Monocytes Absolute: 0.3 10*3/uL (ref 0.1–1.0)
Monocytes Absolute: 0.5 10*3/uL (ref 0.1–1.0)
Monocytes Relative: 4.4 % (ref 3.0–12.0)
Monocytes Relative: 8 %
Neutro Abs: 3.9 10*3/uL (ref 1.4–7.7)
Neutro Abs: 4.2 10*3/uL (ref 1.7–7.7)
Neutrophils Relative %: 55.6 % (ref 43.0–77.0)
Neutrophils Relative %: 60 %
Platelets: 385 10*3/uL (ref 150.0–400.0)
Platelets: 381 10*3/uL (ref 150–400)
RBC: 4.04 Mil/uL (ref 3.87–5.11)
RBC: 3.93 MIL/uL (ref 3.87–5.11)
RDW: 12.9 % (ref 11.5–15.5)
RDW: 11.9 % (ref 11.5–15.5)
WBC: 6.9 10*3/uL (ref 4.0–10.5)
WBC: 7 10*3/uL (ref 4.0–10.5)
nRBC: 0 % (ref 0.0–0.2)

## 2018-08-13 LAB — MAGNESIUM: Magnesium: 1.9 mg/dL (ref 1.5–2.5)

## 2018-08-13 LAB — COMPREHENSIVE METABOLIC PANEL
ALT: 54 U/L — ABNORMAL HIGH (ref 0–35)
AST: 40 U/L — ABNORMAL HIGH (ref 0–37)
Albumin: 4.5 g/dL (ref 3.5–5.2)
Alkaline Phosphatase: 80 U/L (ref 39–117)
BUN: 34 mg/dL — ABNORMAL HIGH (ref 6–23)
CO2: 21 mEq/L (ref 19–32)
Calcium: 10.1 mg/dL (ref 8.4–10.5)
Chloride: 102 mEq/L (ref 96–112)
Creatinine, Ser: 2.24 mg/dL — ABNORMAL HIGH (ref 0.40–1.20)
GFR: 25.34 mL/min — ABNORMAL LOW (ref >60.00)
Glucose, Bld: 89 mg/dL (ref 70–99)
Potassium: 3.3 mEq/L — ABNORMAL LOW (ref 3.5–5.1)
Sodium: 135 mEq/L (ref 135–145)
Total Bilirubin: 0.6 mg/dL (ref 0.2–1.2)
Total Protein: 8 g/dL (ref 6.0–8.3)

## 2018-08-13 LAB — BASIC METABOLIC PANEL
Anion gap: 14 (ref 5–15)
BUN: 34 mg/dL — ABNORMAL HIGH (ref 6–20)
CO2: 20 mmol/L — ABNORMAL LOW (ref 22–32)
Calcium: 10 mg/dL (ref 8.9–10.3)
Chloride: 102 mmol/L (ref 98–111)
Creatinine, Ser: 1.93 mg/dL — ABNORMAL HIGH (ref 0.44–1.00)
GFR calc Af Amer: 36 mL/min — ABNORMAL LOW (ref >60)
GFR calc non Af Amer: 31 mL/min — ABNORMAL LOW (ref >60)
Glucose, Bld: 107 mg/dL — ABNORMAL HIGH (ref 70–99)
Potassium: 3.1 mmol/L — ABNORMAL LOW (ref 3.5–5.1)
Sodium: 136 mmol/L (ref 135–145)

## 2018-08-13 LAB — CK: Total CK: 52 U/L (ref 38–234)

## 2018-08-13 LAB — D-DIMER, QUANTITATIVE: D-Dimer, Quant: 2.66 mcg/mL FEU — ABNORMAL HIGH (ref <0.50)

## 2018-08-13 LAB — AMYLASE: Amylase: 18 U/L — ABNORMAL LOW (ref 27–131)

## 2018-08-13 LAB — TROPONIN I: Troponin I: <0.03 ng/mL (ref <0.03)

## 2018-08-13 LAB — LIPASE: Lipase: 32 U/L (ref 11.0–59.0)

## 2018-08-13 MED ORDER — RABEPRAZOLE SODIUM 20 MG PO TBEC: 20.0000 mg | DELAYED_RELEASE_TABLET | Freq: Every day | ORAL | 0 refills | Status: DC

## 2018-08-13 MED ORDER — SODIUM CHLORIDE 0.9 % IV BOLUS
1000.0000 mL | Freq: Once | INTRAVENOUS | Status: AC
  Administered 2018-08-14: 1000 mL via INTRAVENOUS

## 2018-08-13 MED ORDER — PROMETHAZINE HCL 12.5 MG PO TABS: 12.5000 mg | ORAL_TABLET | Freq: Three times a day (TID) | ORAL | 0 refills | Status: DC | PRN

## 2018-08-13 MED ORDER — CIPROFLOXACIN HCL 500 MG PO TABS: 500.0000 mg | ORAL_TABLET | Freq: Two times a day (BID) | ORAL | 0 refills | Status: DC

## 2018-08-13 NOTE — ED Notes (Signed)
Pt had blood work done at [facility] today results are documented.

## 2018-08-13 NOTE — ED Triage Notes (Signed)
She was seen by her MD this am for lump in her throat and not eating. Swelling in her legs. Chest pain off and on x 3 days and SOB x 2 days. Her lab work from the office visit today showed an elevated D Dimer of 2.66

## 2018-08-13 NOTE — Progress Notes (Signed)
Subjective:    Patient ID: Jessica Molina, female    DOB: 07/16/1976, 42 y.o.   MRN: [id number]  HPI  Pt in states few weeks ago she has had bout of stool incontinence. This happened 2 weeks ago. But since then had loose stools 8-10 times a day for about 7-10 days. No recent antibiotics. No known close contacts to person with GI illness.   Also she had some massive heart burn even with healthy small amount of foods. Sometimes belches. She states belching seems to cause transient chest wall discomfort but no cardiac type symptoms.  Yesterday her stomach felt hard but not today. Pt has not tried any h2 blocker otc or ppi.  She also reports bilateral calf soreness this morning. She states yesterday ankles felt swollen.  Pt did mention briefly daughter is anorexic. [place] in [place]. Is in treatment center. Other daughter may be bulemic  Review of Systems  Constitutional: Negative for chills, fatigue and fever.  HENT: Negative for congestion, ear discharge, nosebleeds, postnasal drip and sinus pressure.   Respiratory: Negative for cough, chest tightness, shortness of breath and wheezing.   Cardiovascular: Negative for chest pain and palpitations.       See hpi. Some discomfort most of time associated with eating.   Gastrointestinal: Positive for abdominal pain, diarrhea, nausea and vomiting. Negative for anal bleeding and constipation.       Within few bites feels lump in throat.  Pt never had gastritis or abdomen symptoms like this before.     Genitourinary: Negative for dysuria, flank pain, frequency, pelvic pain and urgency.  Musculoskeletal: Negative for back pain.       Se hpi.  Neurological: Negative for dizziness, speech difficulty, weakness and headaches.  Hematological: Negative for adenopathy. Does not bruise/bleed easily.  Psychiatric/Behavioral: Negative for behavioral problems and decreased concentration.       Describes experiencing some stress.    Past Medical History:   Diagnosis Date  . 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 Anxiety   . Hypertension   . Migraine   . PONV (postoperative nausea and vomiting)    hard to wake up after appendectomy  . Renal vein stenosis    "Kinked"     Social History   Socioeconomic History  . Marital status: Married    Spouse name: Not on file  . Number of children: 4  . Years of education: HS+  . Highest education level: Not on file  Occupational History  . Occupation: Psychologist, sport and exercisenurse tech  Social Needs  . Financial resource strain: Not on file  . Food insecurity:    Worry: Not on file    Inability: Not on file  . Transportation needs:    Medical: Not on file    Non-medical: Not on file  Tobacco Use  . Smoking status: Never Smoker  . Smokeless tobacco: Never Used  Substance and Sexual Activity  . Alcohol use: Yes    Alcohol/week: 0.0 standard drinks    Comment: 2-3 drinks per week  . Drug use: No  . Sexual activity: Yes    Partners: Male    Birth control/protection: Surgical  Lifestyle  . Physical activity:    Days per week: Not on file    Minutes per session: Not on file  . Stress: Not on file  Relationships  . Social connections:    Talks on phone: Not on file    Gets  together: Not on file    Attends religious service: Not on file    Active member of club or organization: Not on file    Attends meetings of clubs or organizations: Not on file    Relationship status: Not on file  . Intimate partner violence:    Fear of current or ex partner: Not on file    Emotionally abused: Not on file    Physically abused: Not on file    Forced sexual activity: Not on file  Other Topics Concern  . Not on file  Social History Narrative   Lives at home with husband and children.   Right-handed.   2-3 cups caffeine per week.    Past Surgical History:  Procedure Laterality Date  . ABDOMINAL HYSTERECTOMY    . ANEURYSM SURGERY  01/2015  .  APPENDECTOMY    . BTL  2006  . CESAREAN SECTION     Twins,  BTL  . CYSTOSCOPY N/A 06/25/2016   Procedure: CYSTOSCOPY;  Surgeon: Dara Lords, MD;  Location: [facility];  Service: Gynecology;  Laterality: N/A;  . LAPAROSCOPIC VAGINAL HYSTERECTOMY WITH SALPINGECTOMY Bilateral 06/25/2016   Procedure: LAPAROSCOPIC ASSISTED VAGINAL HYSTERECTOMY WITH Bilateral SALPINGECTOMY, Lysis of Adhesions;  Surgeon: Dara Lords, MD;  Location: [facility];  Service: Gynecology;  Laterality: Bilateral;  . RENAL ANGIOPLASTY      Family History  Problem Relation Age of Onset  . Breast cancer Mother        breast   . Thyroid disease Mother   . Cancer Mother 36       breast  . Hyperlipidemia Mother   . Hypertension Father   . Stroke Father   . Melanoma Father        melanoma   . Cancer Father        melanoma  . Breast cancer Maternal Grandmother        breast   . Thyroid disease Maternal Grandmother   . Cancer Maternal Grandfather        unknown   . Cancer Paternal Grandmother        unknow     Allergies  Allergen Reactions  . Lisinopril Cough  . Metoprolol Other (See Comments)    did not feel well when taking     Current Outpatient Medications on File Prior to Visit  Medication Sig Dispense Refill  . ARIPiprazole (ABILIFY) 5 MG tablet TAKE 1 TABLET BY MOUTH EVERY DAY 90 tablet 0  . clonazePAM (KLONOPIN) 0.5 MG tablet Take 0.5 mg by mouth 4 (four) times daily.  1  . clopidogrel (PLAVIX) 75 MG tablet once daily.    . Fremanezumab-vfrm (AJOVY) 225 MG/1.5ML SOSY Inject 225 mg into the skin every 30 (thirty) days. 1 mL 11  . hydrochlorothiazide (HYDRODIURIL) 25 MG tablet Take 1 tablet (25 mg total) by mouth daily. 90 tablet 1  . losartan (COZAAR) 100 MG tablet Take 1 tablet (100 mg total) by mouth daily. 90 tablet 1  . Multiple Vitamin (MULTIVITAMIN WITH MINERALS) TABS tablet Take 1 tablet by mouth daily.    Marland Kitchen tiZANidine (ZANAFLEX) 4 MG tablet Take 1 tablet (4 mg total) by mouth every 6  (six) hours as needed for muscle spasms. 30 tablet 11  . traMADol (ULTRAM) 50 MG tablet TAKE 1 TABLET BY MOUTH EVERY 6 HRS AS NEEDED. MUST LAST 30 DAYS 30 tablet 0   No current facility-administered medications on file prior to visit.     BP 109/71  Pulse 95   Temp 98.2 F (36.8 C) (Oral)   Resp 16   Ht 5\' 3"  (1.6 m)   Wt 188 lb 3.2 oz (85.4 kg)   LMP 06/19/2016 (Exact Date)   SpO2 99%   BMI 33.34 kg/m       Objective:   Physical Exam   General Mental Status- Alert. General Appearance- Not in acute distress.   Skin General: Color- Normal Color. Moisture- Normal Moisture.  Neck Carotid Arteries- Normal color. Moisture- Normal Moisture. No carotid bruits. No JVD.  Chest and Lung Exam Auscultation: Breath Sounds:-Normal.  Cardiovascular Auscultation:Rythm- Regular. Murmurs & Other Heart Sounds:Auscultation of the heart reveals- No Murmurs.  Abdomen Inspection:-Inspeection Normal. Palpation/Percussion:Note:No mass. Palpation and Percussion of the abdomen reveal- Non Tender, Non Distended + BS, no rebound or guarding.    Neurologic Cranial Nerve exam:- CN III-XII intact(No nystagmus), symmetric smile. Strength:- 5/5 equal and symmetric strength both upper and lower extremities.  Lower ext- calf symmetric. Negative homans sign. No obvious swelling. No redness or warmth. Pt does report that legs are swollen.     Assessment & Plan:  You do have some described GERD type symptoms/abdomen pain.  I will get a CBC, CMP, H. pylori, amylase, lipase and one view abdomen x-ray.  Recommend eating healthy bland diet and prescribing AcipHex proton pump inhibitor.  For diarrhea/loose stools for the past 7 to 10 days, I will follow x-ray results to see how much stool may be present.  Make sure no dilated bowels.  If none seen then would recommend Imodium over-the-counter and go ahead and turn in stool panel studies today or tomorrow.  If diarrhea worsens despite conservative  measures pending stool culture studies then go ahead and start print prescription of Cipro.  For intermittent occasional vomiting, I prescribed Phenergan.  You have some Zofran at home but do not use both at the same time.  Try Phenergan presently since Zofran did not work.  For bilateral calf pain with described swelling, I did place order for a d-dimer.  If that test were positive then would order bilateral lower extremity ultrasounds.  If negative would recommend elevating legs daily and notifying us if swelling or pain worsens.  Follow-up in 7 to 10 days or as needed.

## 2018-08-13 NOTE — ED Notes (Signed)
Patient transported to Ultrasound 

## 2018-08-13 NOTE — Telephone Encounter (Signed)
Opened to review and place US order. But she is going to ED tonight rather than wait until tomorrow. [facility] [employer].

## 2018-08-13 NOTE — Telephone Encounter (Signed)
Opened to cancel US. She is going to the ED.

## 2018-08-13 NOTE — ED Notes (Signed)
Patient transported to CT 

## 2018-08-13 NOTE — ED Provider Notes (Addendum)
[facility] EMERGENCY DEPARTMENT Provider Note   CSN: [id number] Arrival date & time: 08/13/18  2224     History   Chief Complaint Chief Complaint  Patient presents with  . Chest Pain    HPI Jessica Molina is a 42 y.o. female.  Patient saw her PCP today with 4-day history of "heartburn", lump in her throat, nausea and vomiting, leg swelling and shortness of breath.  She was sent to the ED to rule out DVT and PE due to elevated d-dimer.  She reports she has had ongoing heartburn pain for the past 4 days.it is worse when she tries to eat when she tries to lay down.  This is associated with some shortness of breath.  She is had multiple episodes of nausea and vomiting.  Had diarrhea last week which is since resolved.  She noticed swelling of her feet today and her doctor sent a d-dimer which was elevated and she admitted to some shortness of breath.  She denies any fevers or any history of heartburn in the past.  Denies any history of CHF, CAD, PE in the past. Denies any pleuritic or exertional nature of the pain.   The history is provided by the patient.  Chest Pain   Associated symptoms include nausea, shortness of breath and vomiting. Pertinent negatives include no abdominal pain, no dizziness, no fever, no headaches and no weakness.    Past Medical History:  Diagnosis Date  . 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 Anxiety   . Hypertension   . Migraine   . PONV (postoperative nausea and vomiting)    hard to wake up after appendectomy  . Renal vein stenosis    "Kinked"    Patient Active Problem List   Diagnosis Date Noted  . Edema, lower extremity 04/13/2017  . Genetic testing 12/02/2016  . Menorrhagia 06/25/2016  . Chronic migraine 04/09/2016  . Chronic migraine without aura without status migrainosus, not intractable 12/19/2015  . Migraine, chronic, without aura 12/19/2015  . Essential  hypertension 05/23/2015  . 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 Cephalalgia 05/18/2015  . Chronic daily headache 05/18/2015    Past Surgical History:  Procedure Laterality Date  . ABDOMINAL HYSTERECTOMY    . ANEURYSM SURGERY  01/2015  . APPENDECTOMY    . BTL  2006  . CESAREAN SECTION     Twins,  BTL  . CYSTOSCOPY N/A 06/25/2016   Procedure: CYSTOSCOPY;  Surgeon: Dara Lords, MD;  Location: [facility];  Service: Gynecology;  Laterality: N/A;  . LAPAROSCOPIC VAGINAL HYSTERECTOMY WITH SALPINGECTOMY Bilateral 06/25/2016   Procedure: LAPAROSCOPIC ASSISTED VAGINAL HYSTERECTOMY WITH Bilateral SALPINGECTOMY, Lysis of Adhesions;  Surgeon: Dara Lords, MD;  Location: [facility];  Service: Gynecology;  Laterality: Bilateral;  . RENAL ANGIOPLASTY       OB History    Gravida  4   Para      Term      Preterm      AB  0   Living  4     SAB      TAB      Ectopic  0   Multiple      Live Births               Home Medications    Prior to Admission medications   Medication Sig Start Date End Date Taking? Authorizing Provider  ARIPiprazole (ABILIFY) 5 MG tablet TAKE  1 TABLET BY MOUTH EVERY DAY 08/04/18   Cottle, Steva Ready., MD  ciprofloxacin (CIPRO) 500 MG tablet Take 1 tablet (500 mg total) by mouth 2 (two) times daily. 08/13/18   Saguier, Ramon Dredge, PA-C  clonazePAM (KLONOPIN) 0.5 MG tablet Take 0.5 mg by mouth 4 (four) times daily. 06/09/18   [provider]  clopidogrel (PLAVIX) 75 MG tablet once daily. 03/01/15   [provider]  Fremanezumab-vfrm (AJOVY) 225 MG/1.5ML SOSY Inject 225 mg into the skin every 30 (thirty) days. 08/04/18   George Hugh, NP  hydrochlorothiazide (HYDRODIURIL) 25 MG tablet Take 1 tablet (25 mg total) by mouth daily. 07/24/18   Donato Schultz, DO  losartan (COZAAR) 100 MG tablet Take 1 tablet (100 mg total) by mouth daily. 07/24/18   Donato Schultz, DO  Multiple Vitamin (MULTIVITAMIN WITH MINERALS) TABS  tablet Take 1 tablet by mouth daily.    [provider]  promethazine (PHENERGAN) 12.5 MG tablet Take 1 tablet (12.5 mg total) by mouth every 8 (eight) hours as needed for nausea or vomiting. 08/13/18   Saguier, Ramon Dredge, PA-C  RABEprazole (ACIPHEX) 20 MG tablet Take 1 tablet (20 mg total) by mouth daily. 08/13/18   Saguier, Ramon Dredge, PA-C  tiZANidine (ZANAFLEX) 4 MG tablet Take 1 tablet (4 mg total) by mouth every 6 (six) hours as needed for muscle spasms. 08/04/18   George Hugh, NP  traMADol (ULTRAM) 50 MG tablet TAKE 1 TABLET BY MOUTH EVERY 6 HRS AS NEEDED. MUST LAST 30 DAYS 08/04/18   George Hugh, NP    Family History Family History  Problem Relation Age of Onset  . Breast cancer Mother        breast   . Thyroid disease Mother   . Cancer Mother 36       breast  . Hyperlipidemia Mother   . Hypertension Father   . Stroke Father   . Melanoma Father        melanoma   . Cancer Father        melanoma  . Breast cancer Maternal Grandmother        breast   . Thyroid disease Maternal Grandmother   . Cancer Maternal Grandfather        unknown   . Cancer Paternal Grandmother        unknow     Social History Social History   Tobacco Use  . Smoking status: Never Smoker  . Smokeless tobacco: Never Used  Substance Use Topics  . Alcohol use: Yes    Alcohol/week: 0.0 standard drinks    Comment: 2-3 drinks per week  . Drug use: No     Allergies   Lisinopril and Metoprolol   Review of Systems Review of Systems  Constitutional: Positive for activity change, appetite change and fatigue. Negative for fever.  HENT: Negative for congestion and rhinorrhea.   Eyes: Negative for visual disturbance.  Respiratory: Positive for shortness of breath. Negative for chest tightness.   Cardiovascular: Positive for chest pain and leg swelling.  Gastrointestinal: Positive for diarrhea, nausea and vomiting. Negative for abdominal pain.  Genitourinary: Negative for dysuria,  hematuria and vaginal bleeding.  Musculoskeletal: Negative for arthralgias and myalgias.  Neurological: Negative for dizziness, weakness and headaches.    all other systems are negative except as noted in the HPI and PMH.    Physical Exam Updated Vital Signs BP 127/84   Pulse (!) 119   Temp 98 F (36.7 C) (Oral)   Resp  18   Ht 5\' 3"  (1.6 m)   Wt 85.3 kg   LMP 06/19/2016 (Exact Date)   SpO2 100%   BMI 33.31 kg/m   Physical Exam  Constitutional: She is oriented to person, place, and time. She appears well-developed and well-nourished. No distress.  HENT:  Head: Normocephalic and atraumatic.  Mouth/Throat: Oropharynx is clear and moist. No oropharyngeal exudate.  Dry mucous membranes  Eyes: Pupils are equal, round, and reactive to light. Conjunctivae and EOM are normal.  Neck: Normal range of motion. Neck supple.  No meningismus.  Cardiovascular: Normal rate, normal heart sounds and intact distal pulses.  No murmur heard. Tachycardic to the 110s  Pulmonary/Chest: Effort normal and breath sounds normal. No respiratory distress. She exhibits no tenderness.  Abdominal: Soft. There is no tenderness. There is no rebound and no guarding.  Musculoskeletal: Normal range of motion. She exhibits no edema or tenderness.  Mild peripheral cyanosis of fingertips and toe tips with intact DP and radial pulses  Neurological: She is alert and oriented to person, place, and time. No cranial nerve deficit. She exhibits normal muscle tone. Coordination normal.  No ataxia on finger to nose bilaterally. No pronator drift. 5/5 strength throughout. CN 2-12 intact.Equal grip strength. Sensation intact.   Skin: Skin is warm.  Psychiatric: She has a normal mood and affect. Her behavior is normal.  Nursing note and vitals reviewed.    ED Treatments / Results  Labs (all labs ordered are listed, but only abnormal results are displayed) Labs Reviewed  CBC WITH DIFFERENTIAL/PLATELET - Abnormal; Notable  for the following components:      Result Value   HCT 35.3 (*)    All other components within normal limits  BASIC METABOLIC PANEL - Abnormal; Notable for the following components:   Potassium 3.1 (*)    CO2 20 (*)    Glucose, Bld 107 (*)    BUN 34 (*)    Creatinine, Ser 1.93 (*)    GFR calc non Af Amer 31 (*)    GFR calc Af Amer 36 (*)    All other components within normal limits  RAPID URINE DRUG SCREEN, HOSP PERFORMED - Abnormal; Notable for the following components:   Benzodiazepines POSITIVE (*)    All other components within normal limits  LIPID PANEL - Abnormal; Notable for the following components:   Cholesterol 242 (*)    Triglycerides 286 (*)    VLDL 57 (*)    LDL Cholesterol 140 (*)    All other components within normal limits  URINALYSIS, ROUTINE W REFLEX MICROSCOPIC - Abnormal; Notable for the following components:   Color, Urine STRAW (*)    Hgb urine dipstick SMALL (*)    All other components within normal limits  MRSA PCR SCREENING  TROPONIN I  BRAIN NATRIURETIC PEPTIDE  CK  CBC  HEMOGLOBIN A1C  TROPONIN I  PREGNANCY, URINE  MAGNESIUM  HEPARIN LEVEL (UNFRACTIONATED)  TROPONIN I  TROPONIN I  HIV ANTIBODY (ROUTINE TESTING W REFLEX)    EKG EKG Interpretation  Date/Time:  Thursday August 13 2018 22:29:12 EST Ventricular Rate:  115 PR Interval:  150 QRS Duration: 88 QT Interval:  326 QTC Calculation: 450 R Axis:   66 Text Interpretation:  Sinus tachycardia Right atrial enlargement Cannot rule out Anterior infarct , age undetermined T wave abnormality, consider inferolateral ischemia Abnormal ECG Rate faster Nonspecific ST abnormality Confirmed by Glynn Octave [phone]) on 08/13/2018 11:00:14 PM   Radiology Dg Chest 2 View  Result  Date: 08/14/2018 CLINICAL DATA:  Shortness of breath. Bilateral lower extremity swelling. EXAM: CHEST - 2 VIEW COMPARISON:  October 22, 2016 FINDINGS: The heart size and mediastinal contours are within normal limits.  Both lungs are clear. The visualized skeletal structures are unremarkable. IMPRESSION: No active cardiopulmonary disease. Electronically Signed   By: Gerome Samavid  Williams III M.D   On: 08/14/2018 00:20   Dg Abd 1 View  Result Date: 08/13/2018 CLINICAL DATA:  Pt states that she has been having pain and swelling in her calves, ankles, and feet. She is having loose stools, heart burn, a feeling of needing to burp, not being able to hold any food down, abdominal bloating and distention all for about a week. EXAM: ABDOMEN - 1 VIEW COMPARISON:  CT of the abdomen and pelvis on 06/07/2016 FINDINGS: The bowel gas pattern is normal. No radio-opaque calculi or other significant radiographic abnormality are seen. IMPRESSION: Negative. Electronically Signed   By: Norva PavlovElizabeth  Brown M.D.   On: 08/13/2018 16:38   [place] Venous Img Lower Bilateral  Result Date: 08/14/2018 CLINICAL DATA:  Lower extremity swelling. EXAM: BILATERAL LOWER EXTREMITY VENOUS DOPPLER ULTRASOUND TECHNIQUE: Gray-scale sonography with graded compression, as well as color Doppler and duplex ultrasound were performed to evaluate the lower extremity deep venous systems from the level of the common femoral vein and including the common femoral, femoral, profunda femoral, popliteal and calf veins including the posterior tibial, peroneal and gastrocnemius veins when visible. The superficial great saphenous vein was also interrogated. Spectral Doppler was utilized to evaluate flow at rest and with distal augmentation maneuvers in the common femoral, femoral and popliteal veins. COMPARISON:  None. FINDINGS: RIGHT LOWER EXTREMITY Common Femoral Vein: No evidence of thrombus. Normal compressibility, respiratory phasicity and response to augmentation. Saphenofemoral Junction: No evidence of thrombus. Normal compressibility and flow on color Doppler imaging. Profunda Femoral Vein: No evidence of thrombus. Normal compressibility and flow on color Doppler imaging. Femoral  Vein: No evidence of thrombus. Normal compressibility, respiratory phasicity and response to augmentation. Popliteal Vein: No evidence of thrombus. Normal compressibility, respiratory phasicity and response to augmentation. Calf Veins: No evidence of thrombus. Normal compressibility and flow on color Doppler imaging. Superficial Great Saphenous Vein: No evidence of thrombus. Normal compressibility. Venous Reflux:  None. Other Findings:  None. LEFT LOWER EXTREMITY Common Femoral Vein: No evidence of thrombus. Normal compressibility, respiratory phasicity and response to augmentation. Saphenofemoral Junction: No evidence of thrombus. Normal compressibility and flow on color Doppler imaging. Profunda Femoral Vein: No evidence of thrombus. Normal compressibility and flow on color Doppler imaging. Femoral Vein: No evidence of thrombus. Normal compressibility, respiratory phasicity and response to augmentation. Popliteal Vein: No evidence of thrombus. Normal compressibility, respiratory phasicity and response to augmentation. Calf Veins: No evidence of thrombus. Normal compressibility and flow on color Doppler imaging. Superficial Great Saphenous Vein: No evidence of thrombus. Normal compressibility. Venous Reflux:  None. Other Findings:  None. IMPRESSION: No evidence of deep venous thrombosis. Electronically Signed   By: Gerome Samavid  Williams III M.D   On: 08/14/2018 00:21    Procedures Procedures (including critical care time)  Medications Ordered in ED Medications  sodium chloride 0.9 % bolus 1,000 mL (has no administration in time range)     Initial Impression / Assessment and Plan / ED Course  I have reviewed the triage vital signs and the nursing notes.  Pertinent labs & imaging results that were available during my care of the patient were reviewed by me and considered in my medical decision making (  see chart for details).    4 days of heartburn, nausea, vomiting, chest pain or shortness of breath with  leg swelling.  Sent from PCP with elevated d-dimer.  Patient does have nonspecific EKG changes with tachycardia and positive d-dimer.  Her creatinine however is elevated and precludes use of CT contrast.  PE considered. CXR is negative. Troponin is negative. Patient does have EKG changes with tachycardia, SOB, CP. Concern for PE remains high and empiric heparin started. AKI precludes IV contrast use. Aortic dissection considered as well but seems less likely with 4 days of ongoing pain, hemodynamic stability and no pulse or strength deficits.  Troponin negative in setting of 4 days of ongoing pain. Doubt ACS.  Dopplers negative for DVT.  Plan admission for rehydration and VQ scan in AM. Empiric heparin continued.  Admission dw Dr. Julian Reil.   CRITICAL CARE Performed by: Glynn Octave Total critical care time: 33 minutes Critical care time was exclusive of separately billable procedures and treating other patients. Critical care was necessary to treat or prevent imminent or life-threatening deterioration. Critical care was time spent personally by me on the following activities: development of treatment plan with patient and/or surrogate as well as nursing, discussions with consultants, evaluation of patient's response to treatment, examination of patient, obtaining history from patient or surrogate, ordering and performing treatments and interventions, ordering and review of laboratory studies, ordering and review of radiographic studies, pulse oximetry and re-evaluation of patient's condition.   Final Clinical Impressions(s) / ED Diagnoses   Final diagnoses:  Atypical chest pain  Elevated d-dimer  AKI (acute kidney injury) [facility])    ED Discharge Orders    None       Glynn Octave, MD 08/14/18 1610    Glynn Octave, MD 08/15/18 [phone]

## 2018-08-13 NOTE — Patient Instructions (Addendum)
You do have some described GERD type symptoms/abdomen pain.  I will get a CBC, CMP, H. pylori, amylase, lipase and one view abdomen x-ray.  Recommend eating healthy bland diet and prescribing AcipHex proton pump inhibitor.  For diarrhea/loose stools for the past 7 to 10 days, I will follow x-ray results to see how much stool may be present.  Make sure no dilated bowels.  If none seen then would recommend Imodium over-the-counter and go ahead and turn in stool panel studies today or tomorrow.  If diarrhea worsens despite conservative measures pending stool culture studies then go ahead and start print prescription of Cipro.  For intermittent occasional vomiting, I prescribed Phenergan.  You have some Zofran at home but do not use both at the same time.  Try Phenergan presently since Zofran did not work.  For bilateral calf pain with described swelling, I did place order for a d-dimer.  If that test were positive then would order bilateral lower extremity ultrasounds.  If negative would recommend elevating legs daily and notifying us if swelling or pain worsens.   Esperanza RichtersEdward Jaydin Boniface, PA-C Follow-up in 7 to 10 days or as needed.

## 2018-08-13 NOTE — Telephone Encounter (Signed)
Opened to review note/ signs and symptoms today.

## 2018-08-14 ENCOUNTER — Inpatient Hospital Stay (HOSPITAL_COMMUNITY): Payer: Commercial Managed Care - PPO

## 2018-08-14 ENCOUNTER — Encounter (HOSPITAL_COMMUNITY): Payer: Self-pay | Admitting: Internal Medicine

## 2018-08-14 ENCOUNTER — Observation Stay (HOSPITAL_COMMUNITY): Payer: Commercial Managed Care - PPO

## 2018-08-14 DIAGNOSIS — R0789 Other chest pain: Secondary | ICD-10-CM | POA: Diagnosis present

## 2018-08-14 DIAGNOSIS — F419 Anxiety disorder, unspecified: Secondary | ICD-10-CM | POA: Diagnosis not present

## 2018-08-14 DIAGNOSIS — R079 Chest pain, unspecified: Secondary | ICD-10-CM | POA: Diagnosis present

## 2018-08-14 DIAGNOSIS — N179 Acute kidney failure, unspecified: Secondary | ICD-10-CM | POA: Diagnosis present

## 2018-08-14 DIAGNOSIS — K219 Gastro-esophageal reflux disease without esophagitis: Secondary | ICD-10-CM | POA: Diagnosis present

## 2018-08-14 DIAGNOSIS — I10 Essential (primary) hypertension: Secondary | ICD-10-CM | POA: Diagnosis present

## 2018-08-14 DIAGNOSIS — R7989 Other specified abnormal findings of blood chemistry: Secondary | ICD-10-CM | POA: Diagnosis present

## 2018-08-14 DIAGNOSIS — E876 Hypokalemia: Secondary | ICD-10-CM | POA: Diagnosis not present

## 2018-08-14 DIAGNOSIS — G9341 Metabolic encephalopathy: Secondary | ICD-10-CM | POA: Diagnosis present

## 2018-08-14 DIAGNOSIS — Z79899 Other long term (current) drug therapy: Secondary | ICD-10-CM | POA: Diagnosis not present

## 2018-08-14 DIAGNOSIS — Z7982 Long term (current) use of aspirin: Secondary | ICD-10-CM | POA: Diagnosis not present

## 2018-08-14 DIAGNOSIS — Z888 Allergy status to other drugs, medicaments and biological substances status: Secondary | ICD-10-CM | POA: Diagnosis not present

## 2018-08-14 LAB — MAGNESIUM: Magnesium: 1.7 mg/dL (ref 1.7–2.4)

## 2018-08-14 LAB — CBC
HCT: 37.1 % (ref 36.0–46.0)
Hemoglobin: 12.7 g/dL (ref 12.0–15.0)
MCH: 30.8 pg (ref 26.0–34.0)
MCHC: 34.2 g/dL (ref 30.0–36.0)
MCV: 90 fL (ref 80.0–100.0)
Platelets: 321 10*3/uL (ref 150–400)
RBC: 4.12 MIL/uL (ref 3.87–5.11)
RDW: 11.9 % (ref 11.5–15.5)
WBC: 6.5 10*3/uL (ref 4.0–10.5)
nRBC: 0 % (ref 0.0–0.2)

## 2018-08-14 LAB — TROPONIN I
Troponin I: <0.03 ng/mL (ref <0.03)
Troponin I: <0.03 ng/mL (ref <0.03)
Troponin I: <0.03 ng/mL (ref <0.03)

## 2018-08-14 LAB — BASIC METABOLIC PANEL
Anion gap: 15 (ref 5–15)
Anion gap: 9 (ref 5–15)
BUN: 23 mg/dL — ABNORMAL HIGH (ref 6–20)
BUN: 20 mg/dL (ref 6–20)
CO2: 13 mmol/L — ABNORMAL LOW (ref 22–32)
CO2: 19 mmol/L — ABNORMAL LOW (ref 22–32)
Calcium: 9.5 mg/dL (ref 8.9–10.3)
Calcium: 9.2 mg/dL (ref 8.9–10.3)
Chloride: 114 mmol/L — ABNORMAL HIGH (ref 98–111)
Chloride: 110 mmol/L (ref 98–111)
Creatinine, Ser: 1.73 mg/dL — ABNORMAL HIGH (ref 0.44–1.00)
Creatinine, Ser: 1.53 mg/dL — ABNORMAL HIGH (ref 0.44–1.00)
GFR calc Af Amer: 41 mL/min — ABNORMAL LOW (ref >60)
GFR calc Af Amer: 47 mL/min — ABNORMAL LOW (ref >60)
GFR calc non Af Amer: 35 mL/min — ABNORMAL LOW (ref >60)
GFR calc non Af Amer: 41 mL/min — ABNORMAL LOW (ref >60)
Glucose, Bld: 101 mg/dL — ABNORMAL HIGH (ref 70–99)
Glucose, Bld: 115 mg/dL — ABNORMAL HIGH (ref 70–99)
Potassium: 4.4 mmol/L (ref 3.5–5.1)
Potassium: 4.6 mmol/L (ref 3.5–5.1)
Sodium: 142 mmol/L (ref 135–145)
Sodium: 138 mmol/L (ref 135–145)

## 2018-08-14 LAB — URINALYSIS, ROUTINE W REFLEX MICROSCOPIC
Bacteria, UA: NONE SEEN
Bilirubin Urine: NEGATIVE
Glucose, UA: NEGATIVE mg/dL
Ketones, ur: NEGATIVE mg/dL
Leukocytes, UA: NEGATIVE
Nitrite: NEGATIVE
Protein, ur: NEGATIVE mg/dL
Specific Gravity, Urine: 1.006 (ref 1.005–1.030)
pH: 5 (ref 5.0–8.0)

## 2018-08-14 LAB — HEMOGLOBIN A1C
Hgb A1c MFr Bld: 5.4 % (ref 4.8–5.6)
Mean Plasma Glucose: 108.28 mg/dL

## 2018-08-14 LAB — MRSA PCR SCREENING: MRSA by PCR: NEGATIVE

## 2018-08-14 LAB — RAPID URINE DRUG SCREEN, HOSP PERFORMED
Amphetamines: NOT DETECTED
Barbiturates: NOT DETECTED
Benzodiazepines: POSITIVE — AB
Cocaine: NOT DETECTED
Opiates: NOT DETECTED
Tetrahydrocannabinol: NOT DETECTED

## 2018-08-14 LAB — LIPID PANEL
Cholesterol: 242 mg/dL — ABNORMAL HIGH (ref 0–200)
HDL: 45 mg/dL (ref >40)
LDL Cholesterol: 140 mg/dL — ABNORMAL HIGH (ref 0–99)
Total CHOL/HDL Ratio: 5.4 RATIO
Triglycerides: 286 mg/dL — ABNORMAL HIGH (ref <150)
VLDL: 57 mg/dL — ABNORMAL HIGH (ref 0–40)

## 2018-08-14 LAB — ECHOCARDIOGRAM COMPLETE
Height: 63 in
Weight: 2959.46 oz

## 2018-08-14 LAB — PREGNANCY, URINE: Preg Test, Ur: NEGATIVE

## 2018-08-14 LAB — HIV ANTIBODY (ROUTINE TESTING W REFLEX): HIV Screen 4th Generation wRfx: NONREACTIVE

## 2018-08-14 LAB — BRAIN NATRIURETIC PEPTIDE: B Natriuretic Peptide: 19.1 pg/mL (ref 0.0–100.0)

## 2018-08-14 MED ORDER — HEPARIN BOLUS VIA INFUSION
4000.0000 [IU] | Freq: Once | INTRAVENOUS | Status: AC
  Administered 2018-08-14: 4000 [IU] via INTRAVENOUS

## 2018-08-14 MED ORDER — MAGIC MOUTHWASH W/LIDOCAINE
10.0000 mL | Freq: Four times a day (QID) | ORAL | Status: DC
  Administered 2018-08-14 (×4): 10 mL via ORAL
  Filled 2018-08-14 (×11): qty 10

## 2018-08-14 MED ORDER — ACETAMINOPHEN 325 MG PO TABS
650.0000 mg | ORAL_TABLET | Freq: Four times a day (QID) | ORAL | Status: DC | PRN
  Administered 2018-08-16: 650 mg via ORAL
  Filled 2018-08-14: qty 2

## 2018-08-14 MED ORDER — TECHNETIUM TO 99M ALBUMIN AGGREGATED
4.0000 | Freq: Once | INTRAVENOUS | Status: AC | PRN
  Administered 2018-08-14: 4 via INTRAVENOUS

## 2018-08-14 MED ORDER — IOPAMIDOL (ISOVUE-370) INJECTION 76%
100.0000 mL | Freq: Once | INTRAVENOUS | Status: AC | PRN
  Administered 2018-08-14: 100 mL via INTRAVENOUS

## 2018-08-14 MED ORDER — LEVALBUTEROL HCL 1.25 MG/0.5ML IN NEBU
1.2500 mg | INHALATION_SOLUTION | Freq: Four times a day (QID) | RESPIRATORY_TRACT | Status: DC
  Administered 2018-08-14: 1.25 mg via RESPIRATORY_TRACT
  Filled 2018-08-14: qty 0.5

## 2018-08-14 MED ORDER — PERFLUTREN LIPID MICROSPHERE
1.0000 mL | INTRAVENOUS | Status: AC | PRN
  Administered 2018-08-14: 2 mL via INTRAVENOUS
  Filled 2018-08-14: qty 10

## 2018-08-14 MED ORDER — PANTOPRAZOLE SODIUM 40 MG PO TBEC
40.0000 mg | DELAYED_RELEASE_TABLET | Freq: Every day | ORAL | Status: DC
  Administered 2018-08-14 – 2018-08-15 (×2): 40 mg via ORAL
  Filled 2018-08-14 (×2): qty 1

## 2018-08-14 MED ORDER — TECHNETIUM TC 99M DIETHYLENETRIAME-PENTAACETIC ACID
32.0000 | Freq: Once | INTRAVENOUS | Status: AC | PRN
  Administered 2018-08-14: 32 via INTRAVENOUS

## 2018-08-14 MED ORDER — SODIUM CHLORIDE 0.9 % IV SOLN
INTRAVENOUS | Status: DC
  Administered 2018-08-14 – 2018-08-16 (×7): via INTRAVENOUS

## 2018-08-14 MED ORDER — ARIPIPRAZOLE 5 MG PO TABS
5.0000 mg | ORAL_TABLET | Freq: Every day | ORAL | Status: DC
  Administered 2018-08-14 – 2018-08-16 (×3): 5 mg via ORAL
  Filled 2018-08-14 (×3): qty 1

## 2018-08-14 MED ORDER — MORPHINE SULFATE (PF) 2 MG/ML IV SOLN
2.0000 mg | INTRAVENOUS | Status: DC | PRN
  Administered 2018-08-14 – 2018-08-16 (×7): 2 mg via INTRAVENOUS
  Filled 2018-08-14 (×7): qty 1

## 2018-08-14 MED ORDER — FENTANYL CITRATE (PF) 100 MCG/2ML IJ SOLN
50.0000 ug | Freq: Once | INTRAMUSCULAR | Status: AC
  Administered 2018-08-14: 50 ug via INTRAVENOUS
  Filled 2018-08-14: qty 2

## 2018-08-14 MED ORDER — ZOLPIDEM TARTRATE 5 MG PO TABS
5.0000 mg | ORAL_TABLET | Freq: Every evening | ORAL | Status: DC | PRN
  Filled 2018-08-14: qty 1

## 2018-08-14 MED ORDER — POTASSIUM CHLORIDE 20 MEQ/15ML (10%) PO SOLN
40.0000 meq | Freq: Once | ORAL | Status: AC
  Administered 2018-08-14: 40 meq via ORAL
  Filled 2018-08-14: qty 30

## 2018-08-14 MED ORDER — LEVALBUTEROL HCL 1.25 MG/0.5ML IN NEBU: 1.2500 mg | INHALATION_SOLUTION | Freq: Four times a day (QID) | RESPIRATORY_TRACT | Status: DC | PRN

## 2018-08-14 MED ORDER — ADULT MULTIVITAMIN W/MINERALS CH
1.0000 | ORAL_TABLET | Freq: Every day | ORAL | Status: DC
  Filled 2018-08-14 (×2): qty 1

## 2018-08-14 MED ORDER — METOCLOPRAMIDE HCL 5 MG/ML IJ SOLN
5.0000 mg | Freq: Once | INTRAMUSCULAR | Status: AC
  Administered 2018-08-14: 5 mg via INTRAVENOUS
  Filled 2018-08-14: qty 2

## 2018-08-14 MED ORDER — POTASSIUM CHLORIDE 10 MEQ/100ML IV SOLN
10.0000 meq | INTRAVENOUS | Status: AC
  Administered 2018-08-14 (×2): 10 meq via INTRAVENOUS
  Filled 2018-08-14 (×2): qty 100

## 2018-08-14 MED ORDER — ORAL CARE MOUTH RINSE
15.0000 mL | Freq: Two times a day (BID) | OROMUCOSAL | Status: DC
  Administered 2018-08-14 – 2018-08-15 (×3): 15 mL via OROMUCOSAL

## 2018-08-14 MED ORDER — CLONAZEPAM 0.5 MG PO TABS
0.5000 mg | ORAL_TABLET | Freq: Four times a day (QID) | ORAL | Status: DC
  Administered 2018-08-14 – 2018-08-16 (×7): 0.5 mg via ORAL
  Filled 2018-08-14 (×8): qty 1

## 2018-08-14 MED ORDER — HEPARIN (PORCINE) 25000 UT/250ML-% IV SOLN
1200.0000 [IU]/h | INTRAVENOUS | Status: DC
  Administered 2018-08-14: 1200 [IU]/h via INTRAVENOUS
  Filled 2018-08-14: qty 250

## 2018-08-14 MED ORDER — INFLUENZA VAC SPLIT QUAD 0.5 ML IM SUSY: 0.5000 mL | PREFILLED_SYRINGE | INTRAMUSCULAR | Status: DC

## 2018-08-14 MED ORDER — DM-GUAIFENESIN ER 30-600 MG PO TB12: 1.0000 | ORAL_TABLET | Freq: Two times a day (BID) | ORAL | Status: DC | PRN

## 2018-08-14 MED ORDER — SODIUM CHLORIDE 0.9 % IV SOLN
Freq: Once | INTRAVENOUS | Status: AC
  Administered 2018-08-14: 02:00:00 via INTRAVENOUS

## 2018-08-14 MED ORDER — HYDRALAZINE HCL 20 MG/ML IJ SOLN: 5.0000 mg | INTRAMUSCULAR | Status: DC | PRN

## 2018-08-14 MED ORDER — ASPIRIN EC 81 MG PO TBEC
81.0000 mg | DELAYED_RELEASE_TABLET | Freq: Every day | ORAL | Status: DC
  Administered 2018-08-14 – 2018-08-16 (×3): 81 mg via ORAL
  Filled 2018-08-14 (×4): qty 1

## 2018-08-14 MED ORDER — OXYCODONE-ACETAMINOPHEN 5-325 MG PO TABS
1.0000 | ORAL_TABLET | ORAL | Status: DC | PRN
  Administered 2018-08-14 – 2018-08-16 (×7): 1 via ORAL
  Filled 2018-08-14 (×7): qty 1

## 2018-08-14 MED ORDER — TIZANIDINE HCL 4 MG PO TABS: 4.0000 mg | ORAL_TABLET | Freq: Four times a day (QID) | ORAL | Status: DC | PRN

## 2018-08-14 MED ORDER — SODIUM CHLORIDE 0.9 % IV SOLN: INTRAVENOUS | Status: DC

## 2018-08-14 MED ORDER — METOCLOPRAMIDE HCL 5 MG/ML IJ SOLN
10.0000 mg | Freq: Once | INTRAMUSCULAR | Status: AC
  Administered 2018-08-14: 10 mg via INTRAVENOUS
  Filled 2018-08-14: qty 2

## 2018-08-14 MED ORDER — ONDANSETRON HCL 4 MG/2ML IJ SOLN
4.0000 mg | Freq: Three times a day (TID) | INTRAMUSCULAR | Status: DC | PRN
  Administered 2018-08-14 – 2018-08-15 (×2): 4 mg via INTRAVENOUS
  Filled 2018-08-14 (×2): qty 2

## 2018-08-14 MED ORDER — AMLODIPINE BESYLATE 5 MG PO TABS
5.0000 mg | ORAL_TABLET | Freq: Every day | ORAL | Status: DC
  Administered 2018-08-14 – 2018-08-16 (×3): 5 mg via ORAL
  Filled 2018-08-14 (×4): qty 1

## 2018-08-14 NOTE — Progress Notes (Signed)
Patient states she was only able to eat a couple of bites of her lunch then got nauseated and had her throat tighten up. This is what she has described as happening to her since this past Monday. After she ate then she complained of chest pain, pain from her neck down to her stomach. She does not appear to be in any distress and VSS. BMET was ordered to get CRT level to see if patient can have CT scan. Will notify MD of labs when resulted.

## 2018-08-14 NOTE — Progress Notes (Signed)
Pt C/o 6/10 cp and nausea PRN morphine given, ekg obtained, V/S stable. Pt going for ct now. MD on call made aware. New order received for Reglan. Will cont to monitor pt.

## 2018-08-14 NOTE — Plan of Care (Addendum)
42 yo F with CP, elevated D.Dimer, AKI.  Sent in by PCP for need to r/o PE.  Has EKG changes as well.  Getting put on heparin gtt, needs VQ In AM.  US of LE neg for DVT.  Will put in for tele obs.  Update: informed that patient will require SDU due to ongoing CP.  Bed request changed to SDU, but no SDUs available.

## 2018-08-14 NOTE — Progress Notes (Signed)
  Echocardiogram 2D Echocardiogram with definity has been performed.  Jessica Molina, Jessica Molina M 08/14/2018, 2:38 PM2

## 2018-08-14 NOTE — Progress Notes (Signed)
ANTICOAGULATION CONSULT NOTE - Initial Consult  Pharmacy Consult for heparin Indication: r/o VTE  Allergies  Allergen Reactions  . Lisinopril Cough  . Metoprolol Other (See Comments)    did not feel well when taking     Patient Measurements: Height: 5\' 3"  (160 cm) Weight: 188 lb 0.8 oz (85.3 kg) IBW/kg (Calculated) : 52.4 Heparin Dosing Weight: 70kg  Vital Signs: Temp: 98 F (36.7 C) (11/14 2229) Temp Source: Oral (11/14 2229) BP: 127/84 (11/14 2229) Pulse Rate: 119 (11/14 2229)  Labs: Recent Labs    08/13/18 1339 08/13/18 2303  HGB 12.7 12.0  HCT 36.6 35.3*  PLT 385.0 381  CREATININE 2.24* 1.93*  CKTOTAL  --  52  TROPONINI  --  <0.03    Estimated Creatinine Clearance: 38.9 mL/min (A) (by C-G formula based on SCr of 1.93 mg/dL (H)).   Medical History: Past Medical History:  Diagnosis Date  . 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 Anxiety   . Hypertension   . Migraine   . PONV (postoperative nausea and vomiting)    hard to wake up after appendectomy  . Renal vein stenosis    "Kinked"    Assessment: 42yo female saw PCP this am w/ multiple sx inc bilateral calf pain w/ swelling >> had labs drawn inc D-dimer, returned elevated and sent to ED where pt also describes CP, SCr elevated (~2, was 0.65 502yr ago) and CTA of chest held, to start heparin for suspected VTE.  Goal of Therapy:  Heparin level 0.3-0.7 units/ml Monitor platelets by anticoagulation protocol: Yes   Plan:  Will give heparin 4000 units x1 followed by gtt at 1200 units/hr and monitor heparin levels and CBC.  Vernard GamblesVeronda Beckem Tomberlin, PharmD, BCPS  08/14/2018,12:10 AM

## 2018-08-14 NOTE — Plan of Care (Signed)

## 2018-08-14 NOTE — Progress Notes (Addendum)
PROGRESS NOTE                                                                                                                                                                                                             Patient Demographics:    Jessica Molina, is a 42 y.o. female, DOB - 04/30/1976, JYN:[id number]  Admit date - 08/13/2018   Admitting Physician Lorretta Harp, MD  Outpatient Primary MD for the patient is Zola Button, Grayling Congress, DO  LOS - 0   Chief Complaint  Patient presents with  . Chest Pain       Brief Narrative   This is a no charge noticed patient admitted earlier today.  42 y.o. 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   Subjective:    [name] today has, No headache, no shortness of breath,no vomiting, reports pain, but appears to be noncardiac .  Reports some nausea today,   Assessment  & Plan :    Principal Problem:   Chest pain Active Problems:   Essential hypertension   AKI (acute kidney injury) (HCC)   Elevated d-dimer   Anxiety   Hypertension   GERD (gastroesophageal reflux disease)   Hypokalemia   Chest pain -Appears to be noncardiac, developed after vomiting and drenching, likely due to musculoskeletal origin versus esophageal origin, start mouthwash with lidocaine. -Continue to monitor in telemetry, cycle cardiac enzymes, and check 2D echo to ensure there is no regional wall motion abnormality to exclude cardiac origin of chest pain. -Rule out PE as she is having elevated D-dimers.   Elevated D-dimers -Venous Dopplers  with no evidence of DVT, given AKI, cannot perform CTA chest, VQ scan is pending, meanwhile empirically on heparin GTT.  AKI -Most likely prerenal in the setting of dehydration from her nausea/vomiting/diarrhea, with contaminant use of diuretics and ARB, continue with IV fluids 100 cc/h, repeat BMP in a.m..  Nausea/vomiting/diarrhea -For the weekend, this is most likely related to gastroenteritis versus viral illness, no further diarrhea, no vomiting, still with  some nausea, continue with PRN Zofran, she had no relief with Zofran today, will give 1 dose of Reglan.  HTN:  -hold home HCTZ, Cozaar -Start amlodipine 5 mg daily -IV hydralazine prn   Depression and anxiety: Stable, no suicidal or homicidal ideations. -Continue home medications: Abilify and Klonopin  GERD: -Protonix  Hypokalemia: K= 3.1 on admission. - Repleted - Check Mg level   Addendum: V/Q scan is negative for PE, so heparin drip has been stopped, statin and has improved to 1.5, so we will proceed with CT aortic dissection protocol to rule out dissection given her nontypical presentation of chest pain.  Code Status : full  Family Communication  : None at bedside  Disposition Plan  : Home once stable  Barriers For Discharge : With AKI, creatinine 1.9, still on IV fluid  Consults  : None  Procedures  : None  DVT Prophylaxis  : Heparin GTT  Lab Results  Component Value Date   PLT 321 08/14/2018    Antibiotics  :    Anti-infectives (From admission, onward)   None        Objective:   Vitals:   08/14/18 0220 08/14/18 0324 08/14/18 0730 08/14/18 0731  BP: 111/78 112/77  102/67  Pulse: (!) 113 (!) 102  96  Resp: (!) 22   18  Temp:  98.2 F (36.8 C)  98.6 F (37 C)  TempSrc:  Oral  Axillary  SpO2: 96% 98% 100% 100%  Weight:  83.9 kg    Height:  5\' 3"  (1.6 m)      Wt Readings from Last 3 Encounters:  08/14/18 83.9 kg  08/13/18 85.4 kg  08/04/18 85.3 kg     Intake/Output Summary  (Last 24 hours) at 08/14/2018 1019 Last data filed at 08/14/2018 0900 Gross per 24 hour  Intake 1713.68 ml  Output 600 ml  Net 1113.68 ml     Physical Exam  Awake Alert, Oriented X 3, No new F.N deficits, Normal affect Symmetrical Chest wall movement, Good air movement bilaterally, CTAB RRR,No Gallops,Rubs or new Murmurs, No Parasternal Heave +ve B.Sounds, Abd Soft, No tenderness,  No rebound - guarding or rigidity. No Cyanosis, Clubbing or edema, No new Rash or bruise      Data Review:    CBC Recent Labs  Lab 08/13/18 1339 08/13/18 2303 08/14/18 0504  WBC 6.9 7.0 6.5  HGB 12.7 12.0 12.7  HCT 36.6 35.3* 37.1  PLT 385.0 381 321  MCV 90.7 89.8 90.0  MCH  --  30.5 30.8  MCHC 34.8 34.0 34.2  RDW 12.9 11.9 11.9  LYMPHSABS 2.7 2.1  --   MONOABS 0.3 0.5  --   EOSABS 0.1 0.1  --   BASOSABS 0.1 0.1  --     Chemistries  Recent Labs  Lab 08/13/18 1339 08/13/18 2303 08/14/18 0504  NA 135 136  --   K 3.3* 3.1*  --   CL 102 102  --   CO2 21 20*  --   GLUCOSE 89 107*  --   BUN 34* 34*  --   CREATININE 2.24* 1.93*  --   CALCIUM 10.1 10.0  --   MG 1.9  --  1.7  AST 40*  --   --   ALT 54*  --   --   ALKPHOS 80  --   --   BILITOT 0.6  --   --    ------------------------------------------------------------------------------------------------------------------ Recent Labs    08/14/18 0504  CHOL 242*  HDL 45  LDLCALC 140*  TRIG 286*  CHOLHDL 5.4    Lab Results  Component Value Date   HGBA1C 5.4 08/14/2018   ------------------------------------------------------------------------------------------------------------------ No results for input(s): TSH, T4TOTAL, T3FREE, THYROIDAB in the last 72 hours.  Invalid input(s): FREET3 ------------------------------------------------------------------------------------------------------------------ No results for input(s): VITAMINB12, FOLATE, FERRITIN, TIBC, IRON, RETICCTPCT in the last 72 hours.  Coagulation  profile No results for input(s): INR, PROTIME in the last 168 hours.  Recent Labs    08/13/18 1339  DDIMER 2.66*    Cardiac Enzymes Recent Labs  Lab 08/13/18 2303 08/14/18 0504  TROPONINI <0.03 <0.03   ------------------------------------------------------------------------------------------------------------------    Component Value Date/Time   BNP 19.1 08/13/2018 2303    Inpatient Medications  Scheduled Meds: . amLODipine  5 mg Oral Daily  . ARIPiprazole  5 mg Oral Daily  . aspirin EC  81 mg Oral Daily  . clonazePAM  0.5 mg Oral QID  . [START ON 08/15/2018] Influenza vac split quadrivalent PF  0.5 mL Intramuscular Tomorrow-1000  . magic mouthwash w/lidocaine  10 mL Oral QID  . mouth rinse  15 mL Mouth Rinse BID  . multivitamin with minerals  1 tablet Oral Daily  . pantoprazole  40 mg Oral Daily   Continuous Infusions: . sodium chloride 100 mL/hr at 08/14/18 0900  . heparin 1,200 Units/hr (08/14/18 0900)   PRN Meds:.acetaminophen, dextromethorphan-guaiFENesin, hydrALAZINE, levalbuterol, morphine injection, ondansetron (ZOFRAN) IV, oxyCODONE-acetaminophen, tiZANidine, zolpidem  Micro Results Recent Results (from the past 240 hour(s))  MRSA PCR Screening     Status: None   Collection Time: 08/14/18  3:19 AM  Result Value Ref Range Status   MRSA by PCR NEGATIVE NEGATIVE Final    Comment:        The GeneXpert MRSA Assay (FDA approved for NASAL specimens only), is one component of a comprehensive MRSA colonization surveillance program. It is not intended to diagnose MRSA infection nor to guide or monitor treatment for MRSA infections. Performed at [facility], [address], [place], [place] [address]     Radiology Reports Dg Chest 2 View  Result Date: 08/14/2018 CLINICAL DATA:  Shortness of breath. Bilateral lower extremity swelling. EXAM: CHEST - 2 VIEW COMPARISON:  October 22, 2016 FINDINGS: The heart size and mediastinal contours are within  normal limits. Both lungs are clear. The visualized skeletal structures are unremarkable. IMPRESSION: No active cardiopulmonary disease. Electronically Signed   By: Gerome Sam III M.D   On: 08/14/2018 00:20   Dg Abd 1 View  Result Date: 08/13/2018 CLINICAL DATA:  Pt states that she has been having pain and swelling in her calves, ankles, and feet. She is having loose stools, heart burn, a feeling of needing to burp, not being able to hold any food down, abdominal bloating and distention all for about a week. EXAM: ABDOMEN - 1 VIEW COMPARISON:  CT of the abdomen and pelvis on 06/07/2016 FINDINGS: The bowel gas pattern is normal. No radio-opaque calculi or other significant radiographic abnormality are seen. IMPRESSION: Negative. Electronically Signed   By: Norva Pavlov M.D.   On: 08/13/2018 16:38   Nm Pulmonary Perf And Vent  Result Date: 08/14/2018 CLINICAL DATA:  Shortness of breath and chest pain EXAM: NUCLEAR MEDICINE VENTILATION - PERFUSION LUNG SCAN VIEWS: Anterior, posterior, left lateral, right lateral, RPO, LPO, RAO, LAO-ventilation and perfusion RADIOPHARMACEUTICALS:  32.0 mCi of Tc-24m DTPA aerosol inhalation and 4.2 mCi Tc16m MAA IV COMPARISON:  Chest radiograph August 14, 2018 FINDINGS: Ventilation: Radiotracer uptake is  homogeneous and symmetric bilaterally. No ventilation defects are evident. Perfusion: Radiotracer uptake is homogeneous and symmetric bilaterally. No perfusion defects are evident. IMPRESSION: No appreciable ventilation or perfusion defects. Very low probability of pulmonary embolus. Electronically Signed   By: Bretta BangWilliam  Woodruff III M.D.   On: 08/14/2018 10:04   [place] Venous Img Lower Bilateral  Result Date: 08/14/2018 CLINICAL DATA:  Lower extremity swelling. EXAM: BILATERAL LOWER EXTREMITY VENOUS DOPPLER ULTRASOUND TECHNIQUE: Gray-scale sonography with graded compression, as well as color Doppler and duplex ultrasound were performed to evaluate the lower  extremity deep venous systems from the level of the common femoral vein and including the common femoral, femoral, profunda femoral, popliteal and calf veins including the posterior tibial, peroneal and gastrocnemius veins when visible. The superficial great saphenous vein was also interrogated. Spectral Doppler was utilized to evaluate flow at rest and with distal augmentation maneuvers in the common femoral, femoral and popliteal veins. COMPARISON:  None. FINDINGS: RIGHT LOWER EXTREMITY Common Femoral Vein: No evidence of thrombus. Normal compressibility, respiratory phasicity and response to augmentation. Saphenofemoral Junction: No evidence of thrombus. Normal compressibility and flow on color Doppler imaging. Profunda Femoral Vein: No evidence of thrombus. Normal compressibility and flow on color Doppler imaging. Femoral Vein: No evidence of thrombus. Normal compressibility, respiratory phasicity and response to augmentation. Popliteal Vein: No evidence of thrombus. Normal compressibility, respiratory phasicity and response to augmentation. Calf Veins: No evidence of thrombus. Normal compressibility and flow on color Doppler imaging. Superficial Great Saphenous Vein: No evidence of thrombus. Normal compressibility. Venous Reflux:  None. Other Findings:  None. LEFT LOWER EXTREMITY Common Femoral Vein: No evidence of thrombus. Normal compressibility, respiratory phasicity and response to augmentation. Saphenofemoral Junction: No evidence of thrombus. Normal compressibility and flow on color Doppler imaging. Profunda Femoral Vein: No evidence of thrombus. Normal compressibility and flow on color Doppler imaging. Femoral Vein: No evidence of thrombus. Normal compressibility, respiratory phasicity and response to augmentation. Popliteal Vein: No evidence of thrombus. Normal compressibility, respiratory phasicity and response to augmentation. Calf Veins: No evidence of thrombus. Normal compressibility and flow on  color Doppler imaging. Superficial Great Saphenous Vein: No evidence of thrombus. Normal compressibility. Venous Reflux:  None. Other Findings:  None. IMPRESSION: No evidence of deep venous thrombosis. Electronically Signed   By: Gerome Samavid  Williams III M.D   On: 08/14/2018 00:21    Time Spent in minutes: no charge note   Huey Bienenstockawood Antion Andres M.D on 08/14/2018 at 10:19 AM  Between 7am to 7pm - Pager - [phone]  After 7pm go to [email or web address] - password [email or web address]  [facility] -  Office  [phone]

## 2018-08-14 NOTE — H&P (Signed)
History and Physical    Jessica Molina ZOX:[id number] DOB: 1975/10/22 DOA: 08/13/2018  Referring MD/NP/PA:   PCP: Donato Schultz, DO   Patient coming from:  The patient is coming from home.  At baseline, pt is independent for most of ADL.        Chief Complaint: Chest pain, shortness of breath  HPI: Jessica Molina is a 42 y.o. female with medical history significant of hypertension, GERD, anxiety, left carotid artery dissection, migraine headaches, who presents with chest pain and shortness breath.  Patient states that she has been having chest pain for more than 4 days.  It is located in the front chest, constant, intermittently accentuated, can reach sharp 10 out of 10 in severity, currently 6 out of 10 severity, radiating to the left side of chest.  She cannot characterize if pain is dull or sharp.  It is not pleuritic.  Not aggravated by deep breath or coughing.  It is associated with the shortness of breath.  She also has bilateral ankle edema, no fever or chills.  She has no recent long distant traveling.  Patient was seen by PCP today.  She had positive d-dimer 2.66, but negative lower extremity venous Doppler for DVT.  Patient also reports heartburn, nausea and vomited several times on Monday, currently no nausea, vomiting, diarrhea or abdominal pain.  No symptoms of UTI or unilateral weakness.  ED Course: pt was found to have negative troponin, BNP 19.1, lipase 32, potassium 3.1, acute renal injury with creatinine 1.93, BUN 34, negative chest x-ray.  Patient is placed on stepdown bed for observation.  IV heparin was started in the ED.  Review of Systems:   General: no fevers, chills, no body weight gain, has poor appetite, has fatigue HEENT: no blurry vision, hearing changes or sore throat Respiratory: has dyspnea, no coughing, wheezing CV: has chest pain, no palpitations GI: currently no nausea, vomiting, abdominal pain, diarrhea, constipation GU: no dysuria, burning on  urination, increased urinary frequency, hematuria  Ext: has ankle edema Neuro: no unilateral weakness, numbness, or tingling, no vision change or hearing loss Skin: no rash, no skin tear. MSK: No muscle spasm, no deformity, no limitation of range of movement in spin Heme: No easy bruising.  Travel history: No recent long distant travel.  Allergy:  Allergies  Allergen Reactions  . Lisinopril Cough  . Metoprolol Other (See Comments)    did not feel well when taking     Past Medical History:  Diagnosis Date  . 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 Anxiety   . Hypertension   . Migraine   . PONV (postoperative nausea and vomiting)    hard to wake up after appendectomy  . Renal vein stenosis    "Kinked"    Past Surgical History:  Procedure Laterality Date  . ABDOMINAL HYSTERECTOMY    . ANEURYSM SURGERY  01/2015  . APPENDECTOMY    . BTL  2006  . CESAREAN SECTION     Twins,  BTL  . CYSTOSCOPY N/A 06/25/2016   Procedure: CYSTOSCOPY;  Surgeon: Dara Lords, MD;  Location: [facility];  Service: Gynecology;  Laterality: N/A;  . LAPAROSCOPIC VAGINAL HYSTERECTOMY WITH SALPINGECTOMY Bilateral 06/25/2016   Procedure: LAPAROSCOPIC ASSISTED VAGINAL HYSTERECTOMY WITH Bilateral SALPINGECTOMY, Lysis of Adhesions;  Surgeon: Dara Lords, MD;  Location: [facility];  Service: Gynecology;  Laterality: Bilateral;  . RENAL ANGIOPLASTY      Social  History:  reports that she has never smoked. She has never used smokeless tobacco. She reports that she drinks alcohol. She reports that she does not use drugs.  Family History:  Family History  Problem Relation Age of Onset  . Breast cancer Mother        breast   . Thyroid disease Mother   . Cancer Mother 31       breast  . Hyperlipidemia Mother   . Hypertension Father   . Stroke Father   . Melanoma Father        melanoma   . Cancer Father        melanoma  . Breast cancer  Maternal Grandmother        breast   . Thyroid disease Maternal Grandmother   . Cancer Maternal Grandfather        unknown   . Cancer Paternal Grandmother        unknow      Prior to Admission medications   Medication Sig Start Date End Date Taking? Authorizing Provider  ARIPiprazole (ABILIFY) 5 MG tablet TAKE 1 TABLET BY MOUTH EVERY DAY 08/04/18   Cottle, Steva Ready., MD  ciprofloxacin (CIPRO) 500 MG tablet Take 1 tablet (500 mg total) by mouth 2 (two) times daily. 08/13/18   Saguier, Ramon Dredge, PA-C  clonazePAM (KLONOPIN) 0.5 MG tablet Take 0.5 mg by mouth 4 (four) times daily. 06/09/18   [provider]  clopidogrel (PLAVIX) 75 MG tablet once daily. 03/01/15   [provider]  Fremanezumab-vfrm (AJOVY) 225 MG/1.5ML SOSY Inject 225 mg into the skin every 30 (thirty) days. 08/04/18   George Hugh, NP  hydrochlorothiazide (HYDRODIURIL) 25 MG tablet Take 1 tablet (25 mg total) by mouth daily. 07/24/18   Donato Schultz, DO  losartan (COZAAR) 100 MG tablet Take 1 tablet (100 mg total) by mouth daily. 07/24/18   Donato Schultz, DO  Multiple Vitamin (MULTIVITAMIN WITH MINERALS) TABS tablet Take 1 tablet by mouth daily.    [provider]  promethazine (PHENERGAN) 12.5 MG tablet Take 1 tablet (12.5 mg total) by mouth every 8 (eight) hours as needed for nausea or vomiting. 08/13/18   Saguier, Ramon Dredge, PA-C  RABEprazole (ACIPHEX) 20 MG tablet Take 1 tablet (20 mg total) by mouth daily. 08/13/18   Saguier, Ramon Dredge, PA-C  tiZANidine (ZANAFLEX) 4 MG tablet Take 1 tablet (4 mg total) by mouth every 6 (six) hours as needed for muscle spasms. 08/04/18   George Hugh, NP  traMADol (ULTRAM) 50 MG tablet TAKE 1 TABLET BY MOUTH EVERY 6 HRS AS NEEDED. MUST LAST 30 DAYS 08/04/18   George Hugh, NP    Physical Exam: Vitals:   08/14/18 0130 08/14/18 0200 08/14/18 0220 08/14/18 0324  BP: 119/73 106/71 111/78 112/77  Pulse: (!) 102 (!) 103 (!) 113 (!) 102    Resp: 15 18 (!) 22   Temp:    98.2 F (36.8 C)  TempSrc:    Oral  SpO2: 99% 100% 96% 98%  Weight:    83.9 kg  Height:    5\' 3"  (1.6 m)   General: Not in acute distress HEENT:       Eyes: PERRL, EOMI, no scleral icterus.       ENT: No discharge from the ears and nose, no pharynx injection, no tonsillar enlargement.        Neck: No JVD, no bruit, no mass felt. Heme: No neck lymph node enlargement. Cardiac: S1/S2, RRR, No murmurs,  No gallops or rubs. Respiratory: No rales, wheezing, rhonchi or rubs. GI: Soft, nondistended, nontender, no rebound pain, no organomegaly, BS present. GU: No hematuria Ext: No pitting leg edema bilaterally. 2+DP/PT pulse bilaterally. Musculoskeletal: No joint deformities, No joint redness or warmth, no limitation of ROM in spin. Skin: No rashes.  Neuro: Alert, oriented X3, cranial nerves II-XII grossly intact, moves all extremities normally.   Psych: Patient is not psychotic, no suicidal or hemocidal ideation.  Labs on Admission: I have personally reviewed following labs and imaging studies  CBC: Recent Labs  Lab 08/13/18 1339 08/13/18 2303  WBC 6.9 7.0  NEUTROABS 3.9 4.2  HGB 12.7 12.0  HCT 36.6 35.3*  MCV 90.7 89.8  PLT 385.0 381   Basic Metabolic Panel: Recent Labs  Lab 08/13/18 1339 08/13/18 2303  NA 135 136  K 3.3* 3.1*  CL 102 102  CO2 21 20*  GLUCOSE 89 107*  BUN 34* 34*  CREATININE 2.24* 1.93*  CALCIUM 10.1 10.0  MG 1.9  --    GFR: Estimated Creatinine Clearance: 38.6 mL/min (A) (by C-G formula based on SCr of 1.93 mg/dL (H)). Liver Function Tests: Recent Labs  Lab 08/13/18 1339  AST 40*  ALT 54*  ALKPHOS 80  BILITOT 0.6  PROT 8.0  ALBUMIN 4.5   Recent Labs  Lab 08/13/18 1339  LIPASE 32.0  AMYLASE 18*   No results for input(s): AMMONIA in the last 168 hours. Coagulation Profile: No results for input(s): INR, PROTIME in the last 168 hours. Cardiac Enzymes: Recent Labs  Lab 08/13/18 2303  CKTOTAL 52   TROPONINI <0.03   BNP (last 3 results) No results for input(s): PROBNP in the last 8760 hours. HbA1C: No results for input(s): HGBA1C in the last 72 hours. CBG: No results for input(s): GLUCAP in the last 168 hours. Lipid Profile: No results for input(s): CHOL, HDL, LDLCALC, TRIG, CHOLHDL, LDLDIRECT in the last 72 hours. Thyroid Function Tests: No results for input(s): TSH, T4TOTAL, FREET4, T3FREE, THYROIDAB in the last 72 hours. Anemia Panel: No results for input(s): VITAMINB12, FOLATE, FERRITIN, TIBC, IRON, RETICCTPCT in the last 72 hours. Urine analysis:    Component Value Date/Time   COLORURINE YELLOW 11/07/2016 0948   APPEARANCEUR CLEAR 11/07/2016 0948   LABSPEC 1.024 11/07/2016 0948   PHURINE 6.0 11/07/2016 0948   GLUCOSEU NEGATIVE 11/07/2016 0948   HGBUR NEGATIVE 11/07/2016 0948   BILIRUBINUR NEGATIVE 11/07/2016 0948   BILIRUBINUR Neg 06/12/2016 0951   KETONESUR NEGATIVE 11/07/2016 0948   PROTEINUR NEGATIVE 11/07/2016 0948   UROBILINOGEN 4.0 06/12/2016 0951   NITRITE NEGATIVE 11/07/2016 0948   LEUKOCYTESUR NEGATIVE 11/07/2016 0948   Sepsis Labs: @LABRCNTIP (procalcitonin:4,lacticidven:4) )No results found for this or any previous visit (from the past 240 hour(s)).   Radiological Exams on Admission: Dg Chest 2 View  Result Date: 08/14/2018 CLINICAL DATA:  Shortness of breath. Bilateral lower extremity swelling. EXAM: CHEST - 2 VIEW COMPARISON:  October 22, 2016 FINDINGS: The heart size and mediastinal contours are within normal limits. Both lungs are clear. The visualized skeletal structures are unremarkable. IMPRESSION: No active cardiopulmonary disease. Electronically Signed   By: Gerome Sam III M.D   On: 08/14/2018 00:20   Dg Abd 1 View  Result Date: 08/13/2018 CLINICAL DATA:  Pt states that she has been having pain and swelling in her calves, ankles, and feet. She is having loose stools, heart burn, a feeling of needing to burp, not being able to hold any  food down, abdominal bloating and distention  all for about a week. EXAM: ABDOMEN - 1 VIEW COMPARISON:  CT of the abdomen and pelvis on 06/07/2016 FINDINGS: The bowel gas pattern is normal. No radio-opaque calculi or other significant radiographic abnormality are seen. IMPRESSION: Negative. Electronically Signed   By: Norva PavlovElizabeth  Brown M.D.   On: 08/13/2018 16:38   [place] Venous Img Lower Bilateral  Result Date: 08/14/2018 CLINICAL DATA:  Lower extremity swelling. EXAM: BILATERAL LOWER EXTREMITY VENOUS DOPPLER ULTRASOUND TECHNIQUE: Gray-scale sonography with graded compression, as well as color Doppler and duplex ultrasound were performed to evaluate the lower extremity deep venous systems from the level of the common femoral vein and including the common femoral, femoral, profunda femoral, popliteal and calf veins including the posterior tibial, peroneal and gastrocnemius veins when visible. The superficial great saphenous vein was also interrogated. Spectral Doppler was utilized to evaluate flow at rest and with distal augmentation maneuvers in the common femoral, femoral and popliteal veins. COMPARISON:  None. FINDINGS: RIGHT LOWER EXTREMITY Common Femoral Vein: No evidence of thrombus. Normal compressibility, respiratory phasicity and response to augmentation. Saphenofemoral Junction: No evidence of thrombus. Normal compressibility and flow on color Doppler imaging. Profunda Femoral Vein: No evidence of thrombus. Normal compressibility and flow on color Doppler imaging. Femoral Vein: No evidence of thrombus. Normal compressibility, respiratory phasicity and response to augmentation. Popliteal Vein: No evidence of thrombus. Normal compressibility, respiratory phasicity and response to augmentation. Calf Veins: No evidence of thrombus. Normal compressibility and flow on color Doppler imaging. Superficial Great Saphenous Vein: No evidence of thrombus. Normal compressibility. Venous Reflux:  None. Other Findings:   None. LEFT LOWER EXTREMITY Common Femoral Vein: No evidence of thrombus. Normal compressibility, respiratory phasicity and response to augmentation. Saphenofemoral Junction: No evidence of thrombus. Normal compressibility and flow on color Doppler imaging. Profunda Femoral Vein: No evidence of thrombus. Normal compressibility and flow on color Doppler imaging. Femoral Vein: No evidence of thrombus. Normal compressibility, respiratory phasicity and response to augmentation. Popliteal Vein: No evidence of thrombus. Normal compressibility, respiratory phasicity and response to augmentation. Calf Veins: No evidence of thrombus. Normal compressibility and flow on color Doppler imaging. Superficial Great Saphenous Vein: No evidence of thrombus. Normal compressibility. Venous Reflux:  None. Other Findings:  None. IMPRESSION: No evidence of deep venous thrombosis. Electronically Signed   By: Gerome Samavid  Williams III M.D   On: 08/14/2018 00:21     EKG: Independently reviewed.  Sinus rhythm, tachycardia, QTC 450, LAE, T wave inversion in inferior leads and V4-V6   Assessment/Plan Principal Problem:   Chest pain Active Problems:   Essential hypertension   AKI (acute kidney injury) (HCC)   Elevated d-dimer   Anxiety   Hypertension   GERD (gastroesophageal reflux disease)   Hypokalemia   Chest pain: Patient has positive d-dimer, very concerning for PE.  Another potential differential diagnosis is aortic dissection given history of carotid artery dissection, but patient does not have typical aortic dissection pain, namely no tearing pain radiating to upper back.  Chest x-ray has no aortic widening.Low suspicions for aortic dissection at this moment.  Will follow-up VQ scan. If patient does not have PE, may consider work-up for dissection.  - will place on SDUfor obs - on IV heparin stared in ED - f/u V/Q - cycle CE q6 x3 and repeat EKG in the am  - prn Morphine, percocet, aspirin - Risk factor  stratification: will check FLP, UDS and A1C  - did not order 2d echo--> please reevaluate pt in AM to decide if pt needs  2D echo.  HTN:  -hold home HCTZ, Cozaar -Start amlodipine 5 mg daily -IV hydralazine prn  AKI: Creatinine 1.93, BUN 34 likely due to prerenal secondary to dehydration and continuation of ARB, diuretics - IVF: 1L NS, then 100 cc/h - Follow up renal function by BMP - Hold HCTA and cozarr  Depression and anxiety: Stable, no suicidal or homicidal ideations. -Continue home medications: Abilify and Klonopin  GERD: -Protonix  Hypokalemia: K= 3.1 on admission. - Repleted - Check Mg level   DVT ppx: on IV Heparin Code Status: Full code Family Communication: None at bed side.      Disposition Plan:  Anticipate discharge back to previous home environment Consults called: none Admission status:  SDU/obs   Date of Service 08/14/2018    Lorretta Harp [facility] Pager [phone]  If 7PM-7AM, please contact night-coverage [email or web address] Password [email or web address] 08/14/2018, 4:28 AM

## 2018-08-14 NOTE — Progress Notes (Signed)
Dr. Randol KernElgergawy notified of patients BUN 20 and Creatinine 1.53 thru paged message.

## 2018-08-15 ENCOUNTER — Inpatient Hospital Stay (HOSPITAL_COMMUNITY): Payer: Commercial Managed Care - PPO

## 2018-08-15 DIAGNOSIS — R7989 Other specified abnormal findings of blood chemistry: Secondary | ICD-10-CM

## 2018-08-15 DIAGNOSIS — G9341 Metabolic encephalopathy: Secondary | ICD-10-CM

## 2018-08-15 DIAGNOSIS — I10 Essential (primary) hypertension: Secondary | ICD-10-CM

## 2018-08-15 DIAGNOSIS — R079 Chest pain, unspecified: Secondary | ICD-10-CM | POA: Diagnosis not present

## 2018-08-15 DIAGNOSIS — N179 Acute kidney failure, unspecified: Secondary | ICD-10-CM | POA: Diagnosis not present

## 2018-08-15 DIAGNOSIS — F419 Anxiety disorder, unspecified: Secondary | ICD-10-CM | POA: Diagnosis not present

## 2018-08-15 LAB — CBC
HCT: 32.8 % — ABNORMAL LOW (ref 36.0–46.0)
Hemoglobin: 10.6 g/dL — ABNORMAL LOW (ref 12.0–15.0)
MCH: 29.6 pg (ref 26.0–34.0)
MCHC: 32.3 g/dL (ref 30.0–36.0)
MCV: 91.6 fL (ref 80.0–100.0)
Platelets: 353 10*3/uL (ref 150–400)
RBC: 3.58 MIL/uL — ABNORMAL LOW (ref 3.87–5.11)
RDW: 11.7 % (ref 11.5–15.5)
WBC: 5.3 10*3/uL (ref 4.0–10.5)
nRBC: 0 % (ref 0.0–0.2)

## 2018-08-15 LAB — BASIC METABOLIC PANEL
Anion gap: 7 (ref 5–15)
BUN: 13 mg/dL (ref 6–20)
CO2: 19 mmol/L — ABNORMAL LOW (ref 22–32)
Calcium: 8.7 mg/dL — ABNORMAL LOW (ref 8.9–10.3)
Chloride: 112 mmol/L — ABNORMAL HIGH (ref 98–111)
Creatinine, Ser: 1.21 mg/dL — ABNORMAL HIGH (ref 0.44–1.00)
GFR calc Af Amer: >60 mL/min (ref >60)
GFR calc non Af Amer: 54 mL/min — ABNORMAL LOW (ref >60)
Glucose, Bld: 91 mg/dL (ref 70–99)
Potassium: 4.1 mmol/L (ref 3.5–5.1)
Sodium: 138 mmol/L (ref 135–145)

## 2018-08-15 MED ORDER — METOCLOPRAMIDE HCL 10 MG PO TABS
10.0000 mg | ORAL_TABLET | Freq: Four times a day (QID) | ORAL | Status: DC | PRN
  Administered 2018-08-15 – 2018-08-16 (×2): 10 mg via ORAL
  Filled 2018-08-15 (×2): qty 1

## 2018-08-15 MED ORDER — PANTOPRAZOLE SODIUM 40 MG PO TBEC
40.0000 mg | DELAYED_RELEASE_TABLET | Freq: Two times a day (BID) | ORAL | Status: DC
  Administered 2018-08-15 – 2018-08-16 (×2): 40 mg via ORAL
  Filled 2018-08-15 (×2): qty 1

## 2018-08-15 MED ORDER — METOCLOPRAMIDE HCL 10 MG PO TABS: 10.0000 mg | ORAL_TABLET | Freq: Four times a day (QID) | ORAL | Status: DC | PRN

## 2018-08-15 NOTE — Plan of Care (Signed)
Progressing

## 2018-08-15 NOTE — Progress Notes (Signed)
PROGRESS NOTE    Jessica Molina  UEA:[id number] DOB: 03/01/1976 DOA: 08/13/2018 PCP: Jessica Molina Molina, Jessica R, DO      Brief Narrative:  Jessica Molina is a 42 y.o. F with anxiety, HTN, hx left carotid pseudoaneurysm s/p cliping, hx of migraines who presented to her PCP for chest discomfort after eating, and SOB.  Found at PCP's office to have new AKI, positive d-dimer.       Assessment & Plan:  Acute kidney injury Baseline creatinine 0.6.  Admission creatinine 2.2, has improved to 1.2 today, still doubled baseline.  Suspect this was from poor oral intake in setting of ARB use. -Continue IV fluids - Trend creatinine -Hold losartan, HCTZ   Positive d-dimer A nonspecific finding. LE dopplers negative, CTA chest negative.  VQ negative.  Chest pain, suspect esophagitis ACS, PE, aortic dissection all ruled out.  CT actually shows some esophagitis findings, and her symptoms at least as she reports them to me, or simply globus sensation (i.e. she feels some chest discomfort and pain in her throat after eating solid food). -Obtain barium esophagram -Increase PPI dose -Transfer to med surg  Nausea, vomiting Suspect this is esophagitis from GERD?  Unable to take any food PO. -Check esophagram -Continue IV fluids  Hypertension -Hold losartan, HCTZ -Continue amlodipine, aspirin  Anxiety -Continue Abilify, clonazepam -Hold Ajovy     MDM and disposition: The below labs and imaging reports were reviewed and summarized above.  Medication management as above.  The patient was admitted with acute kidney injury, chest pain.  Emergent causes of chest pain (PE, ACS, dissection) all been ruled out.  Suspect this is esophagitis, for which he can follow-up as an outpatient if she can advance her diet.  The patient is able to take solid foods and her creatinine is improved tomorrow, home.  If she is unable to take oral intake tomorrow, will consult GI.        DVT prophylaxis:  SCDs Code Status: FULL Family Communication: None present    Consultants:   None  Procedures:   VQ scan  LE doppler US  CTA chest  Antimicrobials:   None    Subjective: Feeling still a globus with food.  Still nauseated all the time.  Still with vomiting with any solid food.  No confusion, fever.  No dyspnea.  Objective: Vitals:   08/15/18 0410 08/15/18 0735 08/15/18 1110 08/15/18 1125  BP: 115/80 (!) 138/94 (!) 132/96 (!) 141/89  Pulse: 96 91 80   Resp: 15 16 19    Temp: 98.4 F (36.9 C) 97.8 F (36.6 C) 98.3 F (36.8 C)   TempSrc: Oral Oral Oral   SpO2: 100% 100% 100%   Weight:      Height:        Intake/Output Summary (Last 24 hours) at 08/15/2018 1510 Last data filed at 08/15/2018 1200 Gross per 24 hour  Intake 3240 ml  Output 3050 ml  Net 190 ml   Filed Weights   08/13/18 2228 08/14/18 0324  Weight: 85.3 kg 83.9 kg    Examination: General appearance:  adult female, alert and in no acute distress.   HEENT: Anicteric, conjunctiva pink, lids and lashes normal. No nasal deformity, discharge, epistaxis.  Lips moist.   Skin: Warm and dry.  no jaundice.  No suspicious rashes or lesions. Cardiac: RRR, nl S1-S2, no murmurs appreciated.  Capillary refill is brisk.  JVP not visible.  No LE edema.  Radia  pulses 2+ and symmetric. Respiratory: Normal respiratory rate  and rhythm.  CTAB without rales or wheezes. Abdomen: Abdomen soft.  no TTP. No ascites, distension, hepatosplenomegaly.   MSK: No deformities or effusions. Neuro: Awake and alert.  EOMI, moves all extremities. Speech fluent.    Psych: Sensorium intact and responding to questions, attention normal. Affect blunted.  Judgment and insight appear normal.    Data Reviewed: I have personally reviewed following labs and imaging studies:  CBC: Recent Labs  Lab 08/13/18 1339 08/13/18 2303 08/14/18 0504 08/15/18 0234  WBC 6.9 7.0 6.5 5.3  NEUTROABS 3.9 4.2  --   --   HGB 12.7 12.0 12.7 10.6*   HCT 36.6 35.3* 37.1 32.8*  MCV 90.7 89.8 90.0 91.6  PLT 385.0 381 321 353   Basic Metabolic Panel: Recent Labs  Lab 08/13/18 1339 08/13/18 2303 08/14/18 0504 08/14/18 1056 08/14/18 1541 08/15/18 0234  NA 135 136  --  142 138 138  K 3.3* 3.1*  --  4.4 4.6 4.1  CL 102 102  --  114* 110 112*  CO2 21 20*  --  13* 19* 19*  GLUCOSE 89 107*  --  101* 115* 91  BUN 34* 34*  --  23* 20 13  CREATININE 2.24* 1.93*  --  1.73* 1.53* 1.21*  CALCIUM 10.1 10.0  --  9.5 9.2 8.7*  MG 1.9  --  1.7  --   --   --    GFR: Estimated Creatinine Clearance: 61.5 mL/min (A) (by C-G formula based on SCr of 1.21 mg/dL (H)). Liver Function Tests: Recent Labs  Lab 08/13/18 1339  AST 40*  ALT 54*  ALKPHOS 80  BILITOT 0.6  PROT 8.0  ALBUMIN 4.5   Recent Labs  Lab 08/13/18 1339  LIPASE 32.0  AMYLASE 18*   No results for input(s): AMMONIA in the last 168 hours. Coagulation Profile: No results for input(s): INR, PROTIME in the last 168 hours. Cardiac Enzymes: Recent Labs  Lab 08/13/18 2303 08/14/18 0504 08/14/18 1056 08/14/18 1536  CKTOTAL 52  --   --   --   TROPONINI <0.03 <0.03 <0.03 <0.03   BNP (last 3 results) No results for input(s): PROBNP in the last 8760 hours. HbA1C: Recent Labs    08/14/18 0504  HGBA1C 5.4   CBG: No results for input(s): GLUCAP in the last 168 hours. Lipid Profile: Recent Labs    08/14/18 0504  CHOL 242*  HDL 45  LDLCALC 140*  TRIG 286*  CHOLHDL 5.4   Thyroid Function Tests: No results for input(s): TSH, T4TOTAL, FREET4, T3FREE, THYROIDAB in the last 72 hours. Anemia Panel: No results for input(s): VITAMINB12, FOLATE, FERRITIN, TIBC, IRON, RETICCTPCT in the last 72 hours. Urine analysis:    Component Value Date/Time   COLORURINE STRAW (A) 08/14/2018 0347   APPEARANCEUR CLEAR 08/14/2018 0347   LABSPEC 1.006 08/14/2018 0347   PHURINE 5.0 08/14/2018 0347   GLUCOSEU NEGATIVE 08/14/2018 0347   HGBUR SMALL (A) 08/14/2018 0347   BILIRUBINUR  NEGATIVE 08/14/2018 0347   BILIRUBINUR Neg 06/12/2016 0951   KETONESUR NEGATIVE 08/14/2018 0347   PROTEINUR NEGATIVE 08/14/2018 0347   UROBILINOGEN 4.0 06/12/2016 0951   NITRITE NEGATIVE 08/14/2018 0347   LEUKOCYTESUR NEGATIVE 08/14/2018 0347   Sepsis Labs: @LABRCNTIP (procalcitonin:4,lacticacidven:4)  ) Recent Results (from the past 240 hour(s))  MRSA PCR Screening     Status: None   Collection Time: 08/14/18  3:19 AM  Result Value Ref Range Status   MRSA by PCR NEGATIVE NEGATIVE Final    Comment:  The GeneXpert MRSA Assay (FDA approved for NASAL specimens only), is one component of a comprehensive MRSA colonization surveillance program. It is not intended to diagnose MRSA infection nor to guide or monitor treatment for MRSA infections. Performed at [facility], [address] [place], [place] [address]          Radiology Studies: Dg Chest 2 View  Result Date: 08/14/2018 CLINICAL DATA:  Shortness of breath. Bilateral lower extremity swelling. EXAM: CHEST - 2 VIEW COMPARISON:  October 22, 2016 FINDINGS: The heart size and mediastinal contours are within normal limits. Both lungs are clear. The visualized skeletal structures are unremarkable. IMPRESSION: No active cardiopulmonary disease. Electronically Signed   By: Gerome Molina III M.D   On: 08/14/2018 00:20   Nm Pulmonary Perf And Vent  Result Date: 08/14/2018 CLINICAL DATA:  Shortness of breath and chest pain EXAM: NUCLEAR MEDICINE VENTILATION - PERFUSION LUNG SCAN VIEWS: Anterior, posterior, left lateral, right lateral, RPO, LPO, RAO, LAO-ventilation and perfusion RADIOPHARMACEUTICALS:  32.0 mCi of Tc-64m DTPA aerosol inhalation and 4.2 mCi Tc55m MAA IV COMPARISON:  Chest radiograph August 14, 2018 FINDINGS: Ventilation: Radiotracer uptake is homogeneous and symmetric bilaterally. No ventilation defects are evident. Perfusion: Radiotracer uptake is homogeneous and symmetric bilaterally. No perfusion  defects are evident. IMPRESSION: No appreciable ventilation or perfusion defects. Very low probability of pulmonary embolus. Electronically Signed   By: Bretta Bang III M.D.   On: 08/14/2018 10:04   US Venous Img Lower Bilateral  Result Date: 08/14/2018 CLINICAL DATA:  Lower extremity swelling. EXAM: BILATERAL LOWER EXTREMITY VENOUS DOPPLER ULTRASOUND TECHNIQUE: Gray-scale sonography with graded compression, as well as color Doppler and duplex ultrasound were performed to evaluate the lower extremity deep venous systems from the level of the common femoral vein and including the common femoral, femoral, profunda femoral, popliteal and calf veins including the posterior tibial, peroneal and gastrocnemius veins when visible. The superficial great saphenous vein was also interrogated. Spectral Doppler was utilized to evaluate flow at rest and with distal augmentation maneuvers in the common femoral, femoral and popliteal veins. COMPARISON:  None. FINDINGS: RIGHT LOWER EXTREMITY Common Femoral Vein: No evidence of thrombus. Normal compressibility, respiratory phasicity and response to augmentation. Saphenofemoral Junction: No evidence of thrombus. Normal compressibility and flow on color Doppler imaging. Profunda Femoral Vein: No evidence of thrombus. Normal compressibility and flow on color Doppler imaging. Femoral Vein: No evidence of thrombus. Normal compressibility, respiratory phasicity and response to augmentation. Popliteal Vein: No evidence of thrombus. Normal compressibility, respiratory phasicity and response to augmentation. Calf Veins: No evidence of thrombus. Normal compressibility and flow on color Doppler imaging. Superficial Great Saphenous Vein: No evidence of thrombus. Normal compressibility. Venous Reflux:  None. Other Findings:  None. LEFT LOWER EXTREMITY Common Femoral Vein: No evidence of thrombus. Normal compressibility, respiratory phasicity and response to augmentation. Saphenofemoral  Junction: No evidence of thrombus. Normal compressibility and flow on color Doppler imaging. Profunda Femoral Vein: No evidence of thrombus. Normal compressibility and flow on color Doppler imaging. Femoral Vein: No evidence of thrombus. Normal compressibility, respiratory phasicity and response to augmentation. Popliteal Vein: No evidence of thrombus. Normal compressibility, respiratory phasicity and response to augmentation. Calf Veins: No evidence of thrombus. Normal compressibility and flow on color Doppler imaging. Superficial Great Saphenous Vein: No evidence of thrombus. Normal compressibility. Venous Reflux:  None. Other Findings:  None. IMPRESSION: No evidence of deep venous thrombosis. Electronically Signed   By: Gerome Molina III M.D   On: 08/14/2018 00:21  Ct Angio Chest Aorta W/cm &/or Wo/cm  Result Date: 08/14/2018 CLINICAL DATA:  Sharp chest pain. History of carotid artery aneurysm versus dissection. EXAM: CT ANGIOGRAPHY CHEST WITH CONTRAST TECHNIQUE: Multidetector CT imaging of the chest was performed using the standard protocol during bolus administration of intravenous contrast. Multiplanar CT image reconstructions and MIPs were obtained to evaluate the vascular anatomy. CONTRAST:  ISOVUE-370 IOPAMIDOL (ISOVUE-370) INJECTION 76% COMPARISON:  None. FINDINGS: Cardiovascular: Satisfactory opacification of the pulmonary arteries to the segmental level. No evidence of pulmonary embolism. Normal heart size. No pericardial effusion. Thoracic aorta is normal in caliber. No thoracic aortic aneurysm or dissection. Mediastinum/Nodes: No mediastinal, hilar or axillary lymphadenopathy. Thyroid gland and trachea are normal. Distal esophageal wall thickening as can be seen with esophagitis. Lungs/Pleura: Trace right pleural effusion. Bibasilar atelectasis. No focal consolidation. No pneumothorax. Upper Abdomen: No acute upper abdominal abnormality. Diffuse low attenuation of the liver as can be seen  with hepatic steatosis. Musculoskeletal: No chest wall abnormality. No acute or significant osseous findings. Review of the MIP images confirms the above findings. IMPRESSION: 1. No evidence of pulmonary embolus. 2. No thoracic aortic dissection.  No thoracic aortic aneurysm. 3. Distal esophageal wall thickening as can be seen with esophagitis. 4. No acute cardiopulmonary disease. 5. Trace right pleural effusion. 6. Hepatic steatosis. Electronically Signed   By: Elige Ko   On: 08/14/2018 19:31        Scheduled Meds: . amLODipine  5 mg Oral Daily  . ARIPiprazole  5 mg Oral Daily  . aspirin EC  81 mg Oral Daily  . clonazePAM  0.5 mg Oral QID  . Influenza vac split quadrivalent PF  0.5 mL Intramuscular Tomorrow-1000  . magic mouthwash w/lidocaine  10 mL Oral QID  . mouth rinse  15 mL Mouth Rinse BID  . multivitamin with minerals  1 tablet Oral Daily  . pantoprazole  40 mg Oral BID AC   Continuous Infusions: . sodium chloride Stopped (08/15/18 1427)     LOS: 1 day    Time spent: 25 minutes    Jessica Sam, MD [facility] 08/15/2018, 3:10 PM     Please page through AMION:  [email or web address] If 7PM-7AM, please contact night-coverage

## 2018-08-16 DIAGNOSIS — R0789 Other chest pain: Secondary | ICD-10-CM

## 2018-08-16 DIAGNOSIS — N179 Acute kidney failure, unspecified: Secondary | ICD-10-CM | POA: Diagnosis not present

## 2018-08-16 DIAGNOSIS — G9341 Metabolic encephalopathy: Secondary | ICD-10-CM | POA: Diagnosis not present

## 2018-08-16 DIAGNOSIS — R079 Chest pain, unspecified: Secondary | ICD-10-CM | POA: Diagnosis not present

## 2018-08-16 LAB — RENAL FUNCTION PANEL
Albumin: 3.1 g/dL — ABNORMAL LOW (ref 3.5–5.0)
Anion gap: 7 (ref 5–15)
BUN: 6 mg/dL (ref 6–20)
CO2: 21 mmol/L — ABNORMAL LOW (ref 22–32)
Calcium: 8.1 mg/dL — ABNORMAL LOW (ref 8.9–10.3)
Chloride: 110 mmol/L (ref 98–111)
Creatinine, Ser: 1.02 mg/dL — ABNORMAL HIGH (ref 0.44–1.00)
GFR calc Af Amer: >60 mL/min (ref >60)
GFR calc non Af Amer: >60 mL/min (ref >60)
Glucose, Bld: 114 mg/dL — ABNORMAL HIGH (ref 70–99)
Phosphorus: 3.4 mg/dL (ref 2.5–4.6)
Potassium: 4 mmol/L (ref 3.5–5.1)
Sodium: 138 mmol/L (ref 135–145)

## 2018-08-16 LAB — CBC WITH DIFFERENTIAL/PLATELET
Abs Immature Granulocytes: 0.01 10*3/uL (ref 0.00–0.07)
Basophils Absolute: 0 10*3/uL (ref 0.0–0.1)
Basophils Relative: 1 %
Eosinophils Absolute: 0.1 10*3/uL (ref 0.0–0.5)
Eosinophils Relative: 3 %
HCT: 32.2 % — ABNORMAL LOW (ref 36.0–46.0)
Hemoglobin: 10.6 g/dL — ABNORMAL LOW (ref 12.0–15.0)
Immature Granulocytes: 0 %
Lymphocytes Relative: 46 %
Lymphs Abs: 2.4 10*3/uL (ref 0.7–4.0)
MCH: 29.9 pg (ref 26.0–34.0)
MCHC: 32.9 g/dL (ref 30.0–36.0)
MCV: 91 fL (ref 80.0–100.0)
Monocytes Absolute: 0.3 10*3/uL (ref 0.1–1.0)
Monocytes Relative: 6 %
Neutro Abs: 2.4 10*3/uL (ref 1.7–7.7)
Neutrophils Relative %: 44 %
Platelets: 376 10*3/uL (ref 150–400)
RBC: 3.54 MIL/uL — ABNORMAL LOW (ref 3.87–5.11)
RDW: 11.3 % — ABNORMAL LOW (ref 11.5–15.5)
WBC: 5.3 10*3/uL (ref 4.0–10.5)
nRBC: 0 % (ref 0.0–0.2)

## 2018-08-16 LAB — MAGNESIUM: Magnesium: 1.2 mg/dL — ABNORMAL LOW (ref 1.7–2.4)

## 2018-08-16 MED ORDER — ASPIRIN 81 MG PO TBEC: 81.0000 mg | DELAYED_RELEASE_TABLET | Freq: Every day | ORAL | 0 refills | Status: DC

## 2018-08-16 MED ORDER — PANTOPRAZOLE SODIUM 40 MG PO TBEC: 40.0000 mg | DELAYED_RELEASE_TABLET | Freq: Every day | ORAL | 1 refills | Status: DC

## 2018-08-17 ENCOUNTER — Encounter: Payer: Self-pay | Admitting: Medical

## 2018-08-17 LAB — D-DIMER, QUANTITATIVE

## 2018-08-17 LAB — H. PYLORI BREATH TEST: H. pylori Breath Test: NOT DETECTED

## 2018-08-17 LAB — GASTROINTESTINAL PATHOGEN PANEL PCR

## 2018-08-17 NOTE — Telephone Encounter (Signed)
Pt. Seen by Ramon DredgeEdward 11/14, and went to ED 11/14, discharged with antibiotic and diet teaching.

## 2018-08-18 ENCOUNTER — Emergency Department (HOSPITAL_BASED_OUTPATIENT_CLINIC_OR_DEPARTMENT_OTHER)
Admission: EM | Admit: 2018-08-18 | Discharge: 2018-08-18 | Disposition: A | Discharge status: Home / Self Care | Payer: Commercial Managed Care - PPO | Attending: Emergency Medicine | Admitting: Emergency Medicine

## 2018-08-18 ENCOUNTER — Encounter: Payer: Self-pay | Admitting: Emergency Medicine

## 2018-08-18 ENCOUNTER — Encounter (HOSPITAL_BASED_OUTPATIENT_CLINIC_OR_DEPARTMENT_OTHER): Payer: Self-pay | Admitting: *Deleted

## 2018-08-18 ENCOUNTER — Ambulatory Visit: Payer: Self-pay

## 2018-08-18 ENCOUNTER — Emergency Department (HOSPITAL_BASED_OUTPATIENT_CLINIC_OR_DEPARTMENT_OTHER): Payer: Commercial Managed Care - PPO

## 2018-08-18 ENCOUNTER — Other Ambulatory Visit: Payer: Self-pay

## 2018-08-18 DIAGNOSIS — R112 Nausea with vomiting, unspecified: Secondary | ICD-10-CM | POA: Insufficient documentation

## 2018-08-18 DIAGNOSIS — F4001 Agoraphobia with panic disorder: Secondary | ICD-10-CM | POA: Insufficient documentation

## 2018-08-18 DIAGNOSIS — Z7982 Long term (current) use of aspirin: Secondary | ICD-10-CM | POA: Insufficient documentation

## 2018-08-18 DIAGNOSIS — R1011 Right upper quadrant pain: Secondary | ICD-10-CM | POA: Insufficient documentation

## 2018-08-18 DIAGNOSIS — I10 Essential (primary) hypertension: Secondary | ICD-10-CM | POA: Diagnosis not present

## 2018-08-18 DIAGNOSIS — Z79899 Other long term (current) drug therapy: Secondary | ICD-10-CM | POA: Insufficient documentation

## 2018-08-18 DIAGNOSIS — F411 Generalized anxiety disorder: Secondary | ICD-10-CM | POA: Insufficient documentation

## 2018-08-18 DIAGNOSIS — R1013 Epigastric pain: Secondary | ICD-10-CM | POA: Diagnosis present

## 2018-08-18 LAB — COMPREHENSIVE METABOLIC PANEL
ALT: 64 U/L — ABNORMAL HIGH (ref 0–44)
AST: 48 U/L — ABNORMAL HIGH (ref 15–41)
Albumin: 4.5 g/dL (ref 3.5–5.0)
Alkaline Phosphatase: 62 U/L (ref 38–126)
Anion gap: 12 (ref 5–15)
BUN: 12 mg/dL (ref 6–20)
CO2: 24 mmol/L (ref 22–32)
Calcium: 9.8 mg/dL (ref 8.9–10.3)
Chloride: 103 mmol/L (ref 98–111)
Creatinine, Ser: 1.2 mg/dL — ABNORMAL HIGH (ref 0.44–1.00)
GFR calc Af Amer: >60 mL/min (ref >60)
GFR calc non Af Amer: 55 mL/min — ABNORMAL LOW (ref >60)
Glucose, Bld: 109 mg/dL — ABNORMAL HIGH (ref 70–99)
Potassium: 3.1 mmol/L — ABNORMAL LOW (ref 3.5–5.1)
Sodium: 139 mmol/L (ref 135–145)
Total Bilirubin: 0.6 mg/dL (ref 0.3–1.2)
Total Protein: 8.3 g/dL — ABNORMAL HIGH (ref 6.5–8.1)

## 2018-08-18 LAB — URINALYSIS, ROUTINE W REFLEX MICROSCOPIC
Bilirubin Urine: NEGATIVE
Glucose, UA: NEGATIVE mg/dL
Hgb urine dipstick: NEGATIVE
Ketones, ur: NEGATIVE mg/dL
Leukocytes, UA: NEGATIVE
Nitrite: NEGATIVE
Protein, ur: NEGATIVE mg/dL
Specific Gravity, Urine: 1.01 (ref 1.005–1.030)
pH: 7 (ref 5.0–8.0)

## 2018-08-18 LAB — CBC WITH DIFFERENTIAL/PLATELET
Abs Immature Granulocytes: 0.02 10*3/uL (ref 0.00–0.07)
Basophils Absolute: 0.1 10*3/uL (ref 0.0–0.1)
Basophils Relative: 1 %
Eosinophils Absolute: 0.1 10*3/uL (ref 0.0–0.5)
Eosinophils Relative: 1 %
HCT: 37.4 % (ref 36.0–46.0)
Hemoglobin: 12.6 g/dL (ref 12.0–15.0)
Immature Granulocytes: 0 %
Lymphocytes Relative: 24 %
Lymphs Abs: 2.4 10*3/uL (ref 0.7–4.0)
MCH: 30.4 pg (ref 26.0–34.0)
MCHC: 33.7 g/dL (ref 30.0–36.0)
MCV: 90.3 fL (ref 80.0–100.0)
Monocytes Absolute: 0.5 10*3/uL (ref 0.1–1.0)
Monocytes Relative: 5 %
Neutro Abs: 7.1 10*3/uL (ref 1.7–7.7)
Neutrophils Relative %: 69 %
Platelets: 457 10*3/uL — ABNORMAL HIGH (ref 150–400)
RBC: 4.14 MIL/uL (ref 3.87–5.11)
RDW: 11.9 % (ref 11.5–15.5)
WBC: 10 10*3/uL (ref 4.0–10.5)
nRBC: 0 % (ref 0.0–0.2)

## 2018-08-18 LAB — LIPASE, BLOOD: Lipase: 51 U/L (ref 11–51)

## 2018-08-18 MED ORDER — PROMETHAZINE HCL 25 MG RE SUPP: 25.0000 mg | Freq: Four times a day (QID) | RECTAL | 0 refills | Status: DC | PRN

## 2018-08-18 MED ORDER — ONDANSETRON HCL 4 MG/2ML IJ SOLN
4.0000 mg | Freq: Once | INTRAMUSCULAR | Status: DC
  Filled 2018-08-18: qty 2

## 2018-08-18 MED ORDER — OXYCODONE HCL 5 MG PO TABS: 5.0000 mg | ORAL_TABLET | Freq: Four times a day (QID) | ORAL | 0 refills | Status: DC | PRN

## 2018-08-18 MED ORDER — SODIUM CHLORIDE 0.9 % IV BOLUS
1000.0000 mL | Freq: Once | INTRAVENOUS | Status: AC
  Administered 2018-08-18: 1000 mL via INTRAVENOUS

## 2018-08-18 MED ORDER — METOCLOPRAMIDE HCL 5 MG/ML IJ SOLN
10.0000 mg | Freq: Once | INTRAMUSCULAR | Status: AC
  Administered 2018-08-18: 10 mg via INTRAVENOUS
  Filled 2018-08-18: qty 2

## 2018-08-18 MED ORDER — POTASSIUM CHLORIDE CRYS ER 20 MEQ PO TBCR
40.0000 meq | EXTENDED_RELEASE_TABLET | Freq: Once | ORAL | Status: AC
  Administered 2018-08-18: 40 meq via ORAL
  Filled 2018-08-18: qty 2

## 2018-08-18 MED FILL — oxyCODONE HCL 5 MG TABS: 5 | 3 days supply | Qty: 12 | Fill #0

## 2018-08-18 MED FILL — PROMETHAZINE HCL 25 MG SUPP: 25 | 3 days supply | Qty: 12 | Fill #0

## 2018-08-18 NOTE — Discharge Instructions (Signed)

## 2018-08-18 NOTE — ED Notes (Signed)
ED Provider at bedside. 

## 2018-08-18 NOTE — ED Notes (Signed)
IV attempts x2 unsuccessful, RN aware.  Patient tolerated well.

## 2018-08-18 NOTE — ED Provider Notes (Signed)
Blood pressure 137/89, pulse (!) 114, temperature 97.8 F (36.6 C), resp. rate 20, last menstrual period 06/19/2016, SpO2 98 %.  Assuming care from Dr. Rush Landmarkegeler.  In short, Jessica Molina is a 42 y.o. female with a chief complaint of epigastric pain, nausea .  Refer to the original H&P for additional details.  The current plan of care is to f/u US and reassess.  5:16 PM Patient with continued tachycardia but improving slightly with IV fluids.  She is not having fever.  No concern for sepsis.  She is feeling slightly better but remains in some pain.  Driving home so I cannot give her pain medication prior to driving.  I do plan to give pain and nausea medication to the patient per plan from previous provider.  His pain, shortness of breath, heart palpitations, or other concern for pulmonary embolism. Plan for discharge with close PCP and GI follow up.    Jessica Molina, Jessica Bocanegra G, MD 08/19/18 1248

## 2018-08-18 NOTE — ED Provider Notes (Signed)
[facility] EMERGENCY DEPARTMENT Provider Note   CSN: [id number] Arrival date & time: 08/18/18  1029     History   Chief Complaint Chief Complaint  Patient presents with  . epigastric pain, nausea    HPI Wen Munford is a 42 y.o. female.  The history is provided by the patient and medical records. No language interpreter was used.  Abdominal Pain   This is a new problem. The current episode started more than 2 days ago. The problem occurs constantly (with eating). The problem has not changed since onset.The pain is associated with eating. The pain is located in the epigastric region and RUQ. The quality of the pain is aching and sharp. The pain is severe. Associated symptoms include nausea, vomiting, frequency and headaches (chronic). Pertinent negatives include anorexia, fever, belching, diarrhea, constipation and dysuria. The symptoms are aggravated by palpation and eating. Nothing relieves the symptoms. Her past medical history is significant for GERD. Past medical history comments: hiatal hernia.    Past Medical History:  Diagnosis Date  . 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 Anxiety   . Hypertension   . Migraine   . PONV (postoperative nausea and vomiting)    hard to wake up after appendectomy  . Renal vein stenosis    "Kinked"    Patient Active Problem List   Diagnosis Date Noted  . Chest pain 08/14/2018  . GERD (gastroesophageal reflux disease) 08/14/2018  . Hypokalemia 08/14/2018  . Acute metabolic encephalopathy 08/14/2018  . AKI (acute kidney injury) (HCC)   . Elevated d-dimer   . Anxiety   . Hypertension   . Edema, lower extremity 04/13/2017  . Genetic testing 12/02/2016  . Menorrhagia 06/25/2016  . Chronic migraine 04/09/2016  . Chronic migraine without aura without status migrainosus, not intractable 12/19/2015  . Migraine, chronic, without aura 12/19/2015  . Essential  hypertension 05/23/2015  . 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 Cephalalgia 05/18/2015  . Chronic daily headache 05/18/2015    Past Surgical History:  Procedure Laterality Date  . ABDOMINAL HYSTERECTOMY    . ANEURYSM SURGERY  01/2015  . APPENDECTOMY    . BTL  2006  . CESAREAN SECTION     Twins,  BTL  . CYSTOSCOPY N/A 06/25/2016   Procedure: CYSTOSCOPY;  Surgeon: Dara Lords, MD;  Location: [facility];  Service: Gynecology;  Laterality: N/A;  . LAPAROSCOPIC VAGINAL HYSTERECTOMY WITH SALPINGECTOMY Bilateral 06/25/2016   Procedure: LAPAROSCOPIC ASSISTED VAGINAL HYSTERECTOMY WITH Bilateral SALPINGECTOMY, Lysis of Adhesions;  Surgeon: Dara Lords, MD;  Location: [facility];  Service: Gynecology;  Laterality: Bilateral;  . RENAL ANGIOPLASTY       OB History    Gravida  4   Para      Term      Preterm      AB  0   Living  4     SAB      TAB      Ectopic  0   Multiple      Live Births               Home Medications    Prior to Admission medications   Medication Sig Start Date End Date Taking? Authorizing Provider  aspirin EC 81 MG EC tablet Take 1 tablet (81 mg total) by mouth daily. 08/17/18   Berton Mount I, MD  ciprofloxacin (CIPRO) 500 MG tablet Take 1 tablet (500 mg  total) by mouth 2 (two) times daily. 08/13/18   Saguier, Ramon Dredge, PA-C  clonazePAM (KLONOPIN) 0.5 MG tablet Take 0.5 mg by mouth See admin instructions. Take 0.5 mg in the morning 1 mg in the evening and 0.5 mg mid-day as needed 06/09/18   [provider]  Fremanezumab-vfrm (AJOVY) 225 MG/1.5ML SOSY Inject 225 mg into the skin every 30 (thirty) days. 08/04/18   George Hugh, NP  hydrochlorothiazide (HYDRODIURIL) 25 MG tablet Take 1 tablet (25 mg total) by mouth daily. 07/24/18   Donato Schultz, DO  losartan (COZAAR) 100 MG tablet Take 1 tablet (100 mg total) by mouth daily. 07/24/18   Donato Schultz, DO  Multiple Vitamin (MULTIVITAMIN WITH MINERALS) TABS  tablet Take 1 tablet by mouth daily.    [provider]  pantoprazole (PROTONIX) 40 MG tablet Take 1 tablet (40 mg total) by mouth daily. 08/16/18 08/16/19  Barnetta Chapel, MD  tiZANidine (ZANAFLEX) 4 MG tablet Take 1 tablet (4 mg total) by mouth every 6 (six) hours as needed for muscle spasms. 08/04/18   George Hugh, NP    Family History Family History  Problem Relation Age of Onset  . Breast cancer Mother        breast   . Thyroid disease Mother   . Cancer Mother 61       breast  . Hyperlipidemia Mother   . Hypertension Father   . Stroke Father   . Melanoma Father        melanoma   . Cancer Father        melanoma  . Breast cancer Maternal Grandmother        breast   . Thyroid disease Maternal Grandmother   . Cancer Maternal Grandfather        unknown   . Cancer Paternal Grandmother        unknow     Social History Social History   Tobacco Use  . Smoking status: Never Smoker  . Smokeless tobacco: Never Used  Substance Use Topics  . Alcohol use: Yes    Alcohol/week: 0.0 standard drinks    Comment: 2-3 drinks per week  . Drug use: No     Allergies   Lisinopril and Metoprolol   Review of Systems Review of Systems  Constitutional: Positive for chills and fatigue. Negative for diaphoresis and fever.  HENT: Negative for congestion.   Eyes: Positive for photophobia. Negative for visual disturbance.  Respiratory: Positive for shortness of breath. Negative for cough, chest tightness and wheezing.   Cardiovascular: Positive for chest pain (lowre sternal pain after eating). Negative for palpitations and leg swelling.  Gastrointestinal: Positive for abdominal pain, nausea and vomiting. Negative for anorexia, constipation and diarrhea.  Genitourinary: Positive for frequency. Negative for dysuria and flank pain.  Musculoskeletal: Negative for back pain, neck pain and neck stiffness.  Skin: Negative for rash and wound.  Neurological: Positive for  headaches (chronic). Negative for light-headedness.  Psychiatric/Behavioral: Negative for agitation.  All other systems reviewed and are negative.    Physical Exam Updated Vital Signs BP (!) 147/90 (BP Location: Right Arm)   Pulse (!) 121   Temp 98.4 F (36.9 C)   Resp 18   LMP 06/19/2016 (Exact Date)   SpO2 95%   Physical Exam  Constitutional: She appears well-developed and well-nourished. No distress.  HENT:  Head: Normocephalic and atraumatic.  Mouth/Throat: Oropharynx is clear and moist. No oropharyngeal exudate.  Eyes: Pupils are equal, round, and reactive  to light. Conjunctivae and EOM are normal.  Neck: Normal range of motion. Neck supple.  Cardiovascular: Regular rhythm. Tachycardia present.  No murmur heard. Pulmonary/Chest: Effort normal and breath sounds normal. No stridor. Tachypnea noted. No respiratory distress. She has no wheezes. She has no rales. She exhibits no tenderness.  Abdominal: Soft. Normal appearance and bowel sounds are normal. She exhibits no distension. There is tenderness in the right upper quadrant and epigastric area. There is no rigidity, no rebound and no CVA tenderness.    Musculoskeletal: She exhibits no edema.  Neurological: She is alert.  Skin: Skin is warm and dry. She is not diaphoretic.  Psychiatric: She has a normal mood and affect.  Nursing note and vitals reviewed.    ED Treatments / Results  Labs (all labs ordered are listed, but only abnormal results are displayed) Labs Reviewed  CBC WITH DIFFERENTIAL/PLATELET - Abnormal; Notable for the following components:      Result Value   Platelets 457 (*)    All other components within normal limits  COMPREHENSIVE METABOLIC PANEL - Abnormal; Notable for the following components:   Potassium 3.1 (*)    Glucose, Bld 109 (*)    Creatinine, Ser 1.20 (*)    Total Protein 8.3 (*)    AST 48 (*)    ALT 64 (*)    GFR calc non Af Amer 55 (*)    All other components within normal limits    URINE CULTURE  LIPASE, BLOOD  URINALYSIS, ROUTINE W REFLEX MICROSCOPIC    EKG EKG Interpretation  Date/Time:  Tuesday August 18 2018 11:57:37 EST Ventricular Rate:  115 PR Interval:    QRS Duration: 90 QT Interval:  329 QTC Calculation: 455 R Axis:   26 Text Interpretation:  Sinus tachycardia Consider right atrial enlargement Low voltage, precordial leads Abnormal T, consider ischemia, diffuse leads When compared to prior, faster rate.  No STEMI Confirmed by Theda Belfast ([id number]) on 08/18/2018 1:05:55 PM   Radiology Dg Chest 2 View  Result Date: 08/18/2018 CLINICAL DATA:  Chest pain for 1 week.  Nausea and vomiting. EXAM: CHEST - 2 VIEW COMPARISON:  Chest radiographs and CT 08/14/2018. FINDINGS: The heart size and mediastinal contours are stable. There is interval improved aeration of the lung bases without significant residual atelectasis. There is no airspace disease, pleural effusion or pneumothorax. Telemetry leads overlie the chest. No acute osseous findings are demonstrated. IMPRESSION: No active cardiopulmonary process. Electronically Signed   By: Carey Bullocks M.D.   On: 08/18/2018 12:56   US Abdomen Limited Ruq  Result Date: 08/18/2018 CLINICAL DATA:  Epigastric abdominal pain with nausea and vomiting. EXAM: ULTRASOUND ABDOMEN LIMITED RIGHT UPPER QUADRANT COMPARISON:  Multiple exams, including 06/10/2016 FINDINGS: Gallbladder: No gallstones or wall thickening visualized. No sonographic Murphy sign noted by sonographer. Common bile duct: Diameter: 4 mm Liver: No focal lesion identified. Coarse echogenic liver with poor sonic penetration compatible with diffuse hepatic steatosis. Portal vein is patent on color Doppler imaging with normal direction of blood flow towards the liver. IMPRESSION: 1. Coarse echogenic liver with poor sonic penetration compatible with diffuse hepatic steatosis. Electronically Signed   By: Gaylyn Rong M.D.   On: 08/18/2018 13:17     Procedures Procedures (including critical care time)  Medications Ordered in ED Medications  ondansetron (ZOFRAN) injection 4 mg (4 mg Intravenous Refused 08/18/18 1340)  sodium chloride 0.9 % bolus 1,000 mL (0 mLs Intravenous Stopped 08/18/18 1453)  metoCLOPramide (REGLAN) injection 10 mg (10 mg Intravenous  Given 08/18/18 1409)  sodium chloride 0.9 % bolus 1,000 mL ( Intravenous Stopped 08/18/18 1555)  potassium chloride SA (K-DUR,KLOR-CON) CR tablet 40 mEq (40 mEq Oral Given 08/18/18 1451)     Initial Impression / Assessment and Plan / ED Course  I have reviewed the triage vital signs and the nursing notes.  Pertinent labs & imaging results that were available during my care of the patient were reviewed by me and considered in my medical decision making (see chart for details).     Jessica Molina is a 42 y.o. female with a past medical history significant for hypertension, migraines, chronic lower extremity edema, and recent diagnosis of hiatal hernia and GERD with recent admission who presents with intolerance of p.o., nausea, vomiting, upper abdominal pain, urinary frequency, chills, muscle cramps, and fatigue.  Patient reports she was discharged 2 days ago and was having mild symptoms.  She says that since discharge she is only eaten 2 bites of ice cream yesterday that did not stay down.  She reports she is able to tolerate some fluids but she feels that she is having upper abdominal pain after she eats.  She reports the pain is slightly in her lower chest.  She reports it gets severe after eating.  She reports very mild shortness of breath, primarily when she is vomiting.  She says that during her recent work-up and admission she had a negative imaging looking for pulmonary embolism.  She also had a reassuring cardiac work-up.  She was told she had a hiatal hernia after esophagram and is going to see a gastroenterologist in the future.  She says that as she is unable to eat or drink  she is concerned about her kidneys again and feeling dehydrated.  She reports her mouth is dry she is felt fatigued, and she has had muscle cramping in her legs and back.  Chart review shows that patient had acute kidney injury during her recent admission.  On exam, lungs are clear.  Chest is nontender.  Upper abdomen is tender in the epigastrium and right upper quadrant.  Lower abdomen nontender.  Patient denied any GU symptoms.  No conservation or diarrhea.  Patient had normal bowel movement earlier today.  Legs had no significant edema and patient otherwise appears well.  Patient was tachycardic in triage with a rate of 121.  Suspect patient is dehydrated given her 2 days of no eating and only mild fluids.  Patient will be given fluids as well as get work-up to look for other etiology of symptoms.  She will have right upper quadrant ultrasound as this is not been done before and she was having tenderness in the upper abdomen.  She will have screening laboratory testing as well as urinalysis with a frequency.  Due to the shortness of breath with chills fever and recent admission, she will have a chest x-ray to rule out pneumonia.  Patient had urinalysis showing no evidence of UTI.  CBC reassuring.  Patient had mild hypokalemia on her CMP and will be given potassium.  Creatinine slightly elevated from her discharge but much better than the one requiring admission at 1.9.  It is now 1.2.  AST and ALT slightly elevated.  Ultrasound showed no evidence of acute cholecystitis.  Evidence of some diffuse hepatic steatosis.  X-ray reassuring.    After first liter, patient's heart rate was still between 110 and 120.  Patient was given a second liter fluids to try and improve her dehydration and tachycardia.  If heart rate is improved and patient is able to ambulate without difficulty, patient will likely be p.o. challenged.  If she is able to eat and drink, she will be safe for discharge with prescription for  likely and oxycodone without Tylenol for pain and prescription for nausea medicine.  She reports she did not receive medications at discharge.    Patient will need to follow-up with a GI physician and PCP as this may be related to her hiatal hernia and gastritis that was just diagnosed.    Patient agrees with plan of care and will be reassessed after fluids.  Care transferred to Dr. Jacqulyn Bath while awaiting reassessment after fluids.  Anticipate discharge.  Final Clinical Impressions(s) / ED Diagnoses   Final diagnoses:  RUQ abdominal pain    Clinical Impression: 1. RUQ abdominal pain     Disposition: Care transferred to Dr. Jacqulyn Bath while awaiting reassessment and rehydration.  This note was prepared with assistance of Conservation officer, historic buildings. Occasional wrong-word or sound-a-like substitutions may have occurred due to the inherent limitations of voice recognition software.     Briggette Najarian, Canary Brim, MD 08/18/18 [phone]

## 2018-08-18 NOTE — ED Notes (Signed)
Pt has been drinking water during stay without issue

## 2018-08-18 NOTE — Telephone Encounter (Signed)
Pt says she can not keep any food down since being discharged from the ED. She says she still feels the same way from being discharged, she has not had any relief. Pt also says she because she is throwing up so much it is causing her to have headaches and her pulse is ranging in 120's. She says the ED told her she has a hernia in her chest that's causing her pain. She doesn't know what to do or take at this point to bring her any type of relief. During call pt stated that her mouth is dry but she is voiding well. Pt stated that she is having edema to legs and arms. Her HR during call was 140 and pt stated she feels shaky and achy. Pt stated that she is having pain that radiates to the back. Pt stated that she is having leg cramps. Pt is able to keep water or tea down but no solid food. Pt advised to go back to the ED for evaluation of symptoms. Reason for Disposition . [1] MILD or MODERATE vomiting AND [2] present > 48 hours (2 days) (Exception: mild vomiting with associated diarrhea)    Pt sent to ED with continues vomiting solid food and HR 140 per minute. . [1] Heart beating very rapidly (e.g., > 140 / minute) AND [2] present now  (Exception: during exercise)  Additional Information . Negative: [1] Vomiting AND [2] abdomen looks much more swollen than usual    Abdomen looks swollen to pt, but not as bad as previous. . Commented on: New or worsened ankle swelling    Swelling in hands wrists and ankles  Answer Assessment - Initial Assessment Questions 1. VOMITING SEVERITY: "How many times have you vomited in the past 24 hours?"     - MILD:  1 - 2 times/day    - MODERATE: 3 - 5 times/day, decreased oral intake without significant weight loss or symptoms of dehydration    - SEVERE: 6 or more times/day, vomits everything or nearly everything, with significant weight loss, symptoms of dehydration      Mild only when eating 2. ONSET: "When did the vomiting begin?"      Started last Monday 3. FLUIDS:  "What fluids or food have you vomited up today?" "Have you been able to keep any fluids down?"     Any time she eats solids she throws it - starts bleching up thick mucous-been sipping water and hot tea  4. ABDOMINAL PAIN: "Are your having any abdominal pain?" If yes : "How bad is it and what does it feel like?" (e.g., crampy, dull, intermittent, constant)      With acid reflux and vomiting 5. DIARRHEA: "Is there any diarrhea?" If so, ask: "How many times today?"      no 6. CONTACTS: "Is there anyone else in the family with the same symptoms?"      no 7. CAUSE: "What do you think is causing your vomiting?"     Dx hiatal hernia and GERD dx in hospital  8. HYDRATION STATUS: "Any signs of dehydration?" (e.g., dry mouth [not only dry lips], too weak to stand) "When did you last urinate?"     Yes -15 minutes ago pt stated that she is urination "a lot" 9. OTHER SYMPTOMS: "Do you have any other symptoms?" (e.g., fever, headache, vertigo, vomiting blood or coffee grounds, recent head injury)     Headache, fatigue, feels shaky, 10. PREGNANCY: "Is there any chance you are pregnant?" "When  was your last menstrual period?"       LMP: hysterectomy 2 years ago.  Protocols used: VOMITING-A-AH, HEART RATE AND HEARTBEAT QUESTIONS-A-AH

## 2018-08-18 NOTE — ED Triage Notes (Signed)
Pt reports d/c home from inpatient on Sunday with dx of gerd. Cont with epigastric pain after she eats and nausea. States they did not prescribe her any pain or nausea medications at discharge and she needs them.

## 2018-08-19 ENCOUNTER — Telehealth: Payer: Self-pay

## 2018-08-19 ENCOUNTER — Ambulatory Visit: Payer: Commercial Managed Care - PPO | Admitting: Psychiatry

## 2018-08-19 LAB — URINE CULTURE: Culture: NO GROWTH

## 2018-08-19 NOTE — Telephone Encounter (Signed)
Left message for patient to resurn my call.

## 2018-08-20 ENCOUNTER — Encounter: Payer: Self-pay | Admitting: Medical

## 2018-08-20 ENCOUNTER — Ambulatory Visit (INDEPENDENT_AMBULATORY_CARE_PROVIDER_SITE_OTHER): Payer: Commercial Managed Care - PPO | Admitting: Medical

## 2018-08-20 ENCOUNTER — Telehealth: Payer: Self-pay

## 2018-08-20 ENCOUNTER — Telehealth: Payer: Self-pay | Admitting: *Deleted

## 2018-08-20 VITALS — BP 126/76 | HR 105 | Temp 98.2°F | Resp 16 | Ht 63.0 in | Wt 181.6 lb

## 2018-08-20 DIAGNOSIS — R110 Nausea: Secondary | ICD-10-CM | POA: Diagnosis not present

## 2018-08-20 DIAGNOSIS — R1013 Epigastric pain: Secondary | ICD-10-CM

## 2018-08-20 LAB — CBC WITH DIFFERENTIAL/PLATELET
Basophils Absolute: 0.1 10*3/uL (ref 0.0–0.1)
Basophils Relative: 1 % (ref 0.0–3.0)
Eosinophils Absolute: 0.1 10*3/uL (ref 0.0–0.7)
Eosinophils Relative: 1.1 % (ref 0.0–5.0)
HCT: 37.9 % (ref 36.0–46.0)
Hemoglobin: 12.8 g/dL (ref 12.0–15.0)
Lymphocytes Relative: 30.3 % (ref 12.0–46.0)
Lymphs Abs: 2.2 10*3/uL (ref 0.7–4.0)
MCHC: 33.9 g/dL (ref 30.0–36.0)
MCV: 91.5 fl (ref 78.0–100.0)
Monocytes Absolute: 0.4 10*3/uL (ref 0.1–1.0)
Monocytes Relative: 5.4 % (ref 3.0–12.0)
Neutro Abs: 4.6 10*3/uL (ref 1.4–7.7)
Neutrophils Relative %: 62.2 % (ref 43.0–77.0)
Platelets: 487 10*3/uL — ABNORMAL HIGH (ref 150.0–400.0)
RBC: 4.14 Mil/uL (ref 3.87–5.11)
RDW: 12.6 % (ref 11.5–15.5)
WBC: 7.3 10*3/uL (ref 4.0–10.5)

## 2018-08-20 LAB — COMPREHENSIVE METABOLIC PANEL
ALT: 68 U/L — ABNORMAL HIGH (ref 0–35)
AST: 41 U/L — ABNORMAL HIGH (ref 0–37)
Albumin: 4.7 g/dL (ref 3.5–5.2)
Alkaline Phosphatase: 65 U/L (ref 39–117)
BUN: 11 mg/dL (ref 6–23)
CO2: 24 mEq/L (ref 19–32)
Calcium: 10.5 mg/dL (ref 8.4–10.5)
Chloride: 102 mEq/L (ref 96–112)
Creatinine, Ser: 1.09 mg/dL (ref 0.40–1.20)
GFR: 58.19 mL/min — ABNORMAL LOW (ref >60.00)
Glucose, Bld: 78 mg/dL (ref 70–99)
Potassium: 4.2 mEq/L (ref 3.5–5.1)
Sodium: 139 mEq/L (ref 135–145)
Total Bilirubin: 0.4 mg/dL (ref 0.2–1.2)
Total Protein: 7.4 g/dL (ref 6.0–8.3)

## 2018-08-20 MED ORDER — SUCRALFATE 1 GM/10ML PO SUSP: 1.0000 g | Freq: Three times a day (TID) | ORAL | 0 refills | Status: DC

## 2018-08-20 NOTE — Patient Instructions (Signed)
You have had some moderate severe epigastric pain with episodes of nausea as well as occasional vomiting.  This is led to dehydration.  So far study showed a mild hiatal hernia.  Negative ultrasound studies for gallbladder disease.  Also H. pylori study was negative.  I want you to continue Protonix and please get Tagamet over-the-counter as well.  I did prescribe Carafate to see if this helps with your abdomen discomfort.  Continue Phenergan for nausea and vomiting.  I do want you to concentrate on eating bland foods and hydrating well.  I would recommend trying to hydrate with [employer] water or Gatorade.  Also you could try some Jell-O.  You had recent low potassium and want to recheck that today with labs.  Your blood pressure has been a little low when you were dehydrated but is in normal range today.  Pulse is been slightly high at think that is probably related to dehydration.  We will follow your GI work-up no and see what they think.  Hopefully we can get you in with specialist by early next week.  Hopefully GI symptoms will resolve and your pulse will resolve when dehydration resolves.  You had a negative cardiac work-up recently and I do not think you have cardiac issues/diagnosis.  However we will continue to monitor that and might refer you to cardiologist if needed.  If worse signs symptoms after hours or over the weekend then recommend ED evaluation again.  Follow-up here with PCP or myself depending on when we get you in with GI MD.

## 2018-08-20 NOTE — Telephone Encounter (Signed)
08/20/18  Transition Care Management Follow-up Telephone Call  ADMISSION DATE: 08/18/18  DISCHARGE DATE: 11/  How have you been since you were released from the hospital? Patient states she has been back to ED since hospital discharge.   Do you understand why you were in the hospital? Yes   Do you understand the discharge instrcutions? Yes    Items Reviewed:  Medications reviewed: Yes   Allergies reviewed: Yes   Dietary changes reviewed: Heart healthy   Referrals reviewed: Appointment scheduled   Functional Questionnaire:   Activities of Daily Living (ADLs): Patient can perform all independently.  Any patient concerns? Would like Rx for Doxepin 10 mg and Promethazine 25 mg suppositories.    Confirmed importance and date/time of follow-up visits scheduled: Yes   Confirmed with patient if condition begins to worsen call PCP or go to the ER. Yes    Patient was given the office number and encouragred to call back with questions or concerns. Yes

## 2018-08-20 NOTE — Telephone Encounter (Signed)
[facility] forwarded to provider/SLS  11/21

## 2018-08-20 NOTE — Progress Notes (Signed)
Subjective:    Patient ID: Jessica AbbotNicole Molina, female    DOB: 10/08/1975, 42 y.o.   MRN: [id number]  HPI  Pt in for follow up(from both hospital admission on 08/13/2018. Discharge date 08/16/2018. Also from ED visit post DC. Both notes reviewed.  She is having some pain with eating and even drinking  cold water. Pt states she  can tolerate very small amount of foods such as mashed potatoes since discharge from ED second time. Pt has been able to eat some popsicles. Pt had work up for non-intractable vomiting and chest pain in the ED as well as pain in upper abdomen. Pt was admitted and given pantoprazole. She had hard time eating post dc and got dehydrated again. Evaluated at ED second time and given  IV fluids and given k. Now in for follow up.  Pt has been having GI symptoms since early November.   In ED and in hospital tropinin levels were not elevated.  Ekg showed sinus tachycardia. Yesterday in ED.  US in ED 2 days ago showed no chololithiasis. Some fat on her liver.  Pt did have barium swallow. Tiny hiatal hernia. No esophageal narrowing.   Review of Systems  Constitutional: Negative for chills, fatigue and fever.  Respiratory: Negative for chest tightness, shortness of breath and wheezing.   Cardiovascular: Negative for chest pain and palpitations.  Gastrointestinal: Positive for abdominal pain. Negative for abdominal distention and blood in stool.  Musculoskeletal: Negative for back pain.  Skin: Negative for rash.  Neurological: Negative for dizziness and headaches.  Hematological: Negative for adenopathy.  Psychiatric/Behavioral: Negative for behavioral problems.    Past Medical History:  Diagnosis Date  . 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 Anxiety   . Hypertension   . Migraine   . PONV (postoperative nausea and vomiting)    hard to wake up after appendectomy  . Renal vein stenosis    "Kinked"     Social History   Socioeconomic History  . Marital status: Married    Spouse name: Not on file  . Number of children: 4  . Years of education: HS+  . Highest education level: Not on file  Occupational History  . Occupation: Psychologist, sport and exercisenurse tech  Social Needs  . Financial resource strain: Not on file  . Food insecurity:    Worry: Not on file    Inability: Not on file  . Transportation needs:    Medical: Not on file    Non-medical: Not on file  Tobacco Use  . Smoking status: Never Smoker  . Smokeless tobacco: Never Used  Substance and Sexual Activity  . Alcohol use: Yes    Alcohol/week: 0.0 standard drinks    Comment: 2-3 drinks per week  . Drug use: No  . Sexual activity: Yes    Partners: Male    Birth control/protection: Surgical  Lifestyle  . Physical activity:    Days per week: Not on file    Minutes per session: Not on file  . Stress: Not on file  Relationships  . Social connections:    Talks on phone: Not on file    Gets together: Not on file    Attends religious service: Not on file    Active member of club or organization: Not on file    Attends meetings of clubs or organizations: Not on file    Relationship status: Not on file  . Intimate partner violence:  Fear of current or ex partner: Not on file    Emotionally abused: Not on file    Physically abused: Not on file    Forced sexual activity: Not on file  Other Topics Concern  . Not on file  Social History Narrative   Lives at home with husband and children.   Right-handed.   2-3 cups caffeine per week.    Past Surgical History:  Procedure Laterality Date  . ABDOMINAL HYSTERECTOMY    . ANEURYSM SURGERY  01/2015  . APPENDECTOMY    . BTL  2006  . CESAREAN SECTION     Twins,  BTL  . CYSTOSCOPY N/A 06/25/2016   Procedure: CYSTOSCOPY;  Surgeon: Dara Lords, MD;  Location: [facility];  Service: Gynecology;  Laterality: N/A;  . LAPAROSCOPIC VAGINAL HYSTERECTOMY WITH SALPINGECTOMY Bilateral 06/25/2016    Procedure: LAPAROSCOPIC ASSISTED VAGINAL HYSTERECTOMY WITH Bilateral SALPINGECTOMY, Lysis of Adhesions;  Surgeon: Dara Lords, MD;  Location: [facility];  Service: Gynecology;  Laterality: Bilateral;  . RENAL ANGIOPLASTY      Family History  Problem Relation Age of Onset  . Breast cancer Mother        breast   . Thyroid disease Mother   . Cancer Mother 72       breast  . Hyperlipidemia Mother   . Hypertension Father   . Stroke Father   . Melanoma Father        melanoma   . Cancer Father        melanoma  . Breast cancer Maternal Grandmother        breast   . Thyroid disease Maternal Grandmother   . Cancer Maternal Grandfather        unknown   . Cancer Paternal Grandmother        unknow     Allergies  Allergen Reactions  . Lisinopril Cough  . Metoprolol Other (See Comments)    did not feel well when taking     Current Outpatient Medications on File Prior to Visit  Medication Sig Dispense Refill  . aspirin EC 81 MG EC tablet Take 1 tablet (81 mg total) by mouth daily. 30 tablet 0  . clonazePAM (KLONOPIN) 0.5 MG tablet Take 0.5 mg by mouth See admin instructions. Take 0.5 mg in the morning 1 mg in the evening and 0.5 mg mid-day as needed  1  . doxepin (SINEQUAN) 10 MG capsule Take 10 mg by mouth at bedtime.    . Fremanezumab-vfrm (AJOVY) 225 MG/1.5ML SOSY Inject 225 mg into the skin every 30 (thirty) days. 1 mL 11  . hydrochlorothiazide (HYDRODIURIL) 25 MG tablet Take 1 tablet (25 mg total) by mouth daily. 90 tablet 1  . losartan (COZAAR) 100 MG tablet Take 1 tablet (100 mg total) by mouth daily. 90 tablet 1  . Multiple Vitamin (MULTIVITAMIN WITH MINERALS) TABS tablet Take 1 tablet by mouth daily.    Marland Kitchen oxyCODONE (ROXICODONE) 5 MG immediate release tablet Take 1 tablet (5 mg total) by mouth every 6 (six) hours as needed for severe pain. 12 tablet 0  . pantoprazole (PROTONIX) 40 MG tablet Take 1 tablet (40 mg total) by mouth daily. 30 tablet 1  . PARoxetine (PAXIL) 40 MG  tablet Take 40 mg by mouth every morning. 2 po qd    . tiZANidine (ZANAFLEX) 4 MG tablet Take 1 tablet (4 mg total) by mouth every 6 (six) hours as needed for muscle spasms. 30 tablet 11   No current facility-administered  medications on file prior to visit.     BP (!) 142/86   Pulse (!) 111   Temp 98.2 F (36.8 C) (Oral)   Resp 16   Ht 5\' 3"  (1.6 m)   Wt 181 lb 9.6 oz (82.4 kg)   LMP 06/19/2016 (Exact Date)   SpO2 100%   BMI 32.17 kg/m       Objective:   Physical Exam   General Mental Status- Alert. General Appearance- Not in acute distress.   Skin General: Color- Normal Color. Moisture- Normal Moisture.  Neck Carotid Arteries- Normal color. Moisture- Normal Moisture. No carotid bruits. No JVD.  Chest and Lung Exam Auscultation: Breath Sounds:-Normal.  Cardiovascular Auscultation:Rythm- Regular. Murmurs & Other Heart Sounds:Auscultation of the heart reveals- No Murmurs.  Abdomen Inspection:-Inspeection Normal. Palpation/Percussion:Note:No mass. Palpation and Percussion of the abdomen reveal- Non Tender, Non Distended + BS, no rebound or guarding.   Neurologic Cranial Nerve exam:- CN III-XII intact(No nystagmus), symmetric smile. Strength:- 5/5 equal and symmetric strength both upper and lower extremities.     Assessment & Plan:  You have had some moderate severe epigastric pain with episodes of nausea as well as occasional vomiting.  This is led to dehydration.  So far study showed a mild hiatal hernia.  Negative ultrasound studies for gallbladder disease.  Also H. pylori study was negative.  I want you to continue Protonix and please get Tagamet over-the-counter as well.  I did prescribe Carafate to see if this helps with your abdomen discomfort.  Continue Phenergan for nausea and vomiting.  I do want you to concentrate on eating bland foods and hydrating well.  I would recommend trying to hydrate with [employer] water or Gatorade.  Also you could try some  Jell-O.  You had recent low potassium and want to recheck that today with labs.  Your blood pressure has been a little low when you were dehydrated but is in normal range today.  Pulse is been slightly high at think that is probably related to dehydration.  We will follow your GI work-up no and see what they think.  Hopefully we can get you in with specialist by early next week.  Hopefully GI symptoms will resolve and your pulse will resolve when dehydration resolves.  You had a negative cardiac work-up recently and I do not think you have cardiac issues/diagnosis.  However we will continue to monitor that and might refer you to cardiologist if needed.  If worse signs symptoms after hours or over the weekend then recommend ED evaluation again.  Follow-up here with PCP or myself depending on when we get you in with GI MD.  Esperanza Richters, PA-C

## 2018-08-20 NOTE — Telephone Encounter (Signed)
Copied from CRM [id number]. Topic: Quick Communication - See Telephone Encounter >> Aug 19, 2018  1:30 PM Jessica AmatoBurton, Donna F wrote: Pt returning a call back from [place]   Best number [phone]

## 2018-08-21 ENCOUNTER — Encounter: Payer: Self-pay | Admitting: Mental Health

## 2018-08-21 ENCOUNTER — Ambulatory Visit (INDEPENDENT_AMBULATORY_CARE_PROVIDER_SITE_OTHER): Payer: Commercial Managed Care - PPO | Admitting: Mental Health

## 2018-08-21 DIAGNOSIS — F411 Generalized anxiety disorder: Secondary | ICD-10-CM

## 2018-08-21 DIAGNOSIS — F331 Major depressive disorder, recurrent, moderate: Secondary | ICD-10-CM | POA: Diagnosis not present

## 2018-08-21 NOTE — Progress Notes (Signed)
      [facility] Counselor/Therapist Progress Note  Patient ID: Jessica Molina, MRN: [id number],    Date: 08/21/2018  Time Spent: 45 minutes  Treatment Type: Individual Therapy  Reported Symptoms: Anxious Mood, Irritability, Fatigue and Appetite disturbance  Mental Status Exam:  Appearance:   Casual     Behavior:  Appropriate and Sharing  Motor:  Normal  Speech/Language:   Clear and Coherent  Affect:  Appropriate  Mood:  anxious  Thought process:  normal  Thought content:    WNL  Sensory/Perceptual disturbances:    WNL  Orientation:  oriented to person, place and time/date  Attention:  Good  Concentration:  Good  Memory:  WNL  Fund of knowledge:   Good  Insight:    Good  Judgment:   Good  Impulse Control:  Good   Risk Assessment: Danger to Self:  No Self-injurious Behavior: No Danger to Others: No Duty to Warn:no Physical Aggression / Violence:No  Access to Firearms a concern: No  Gang Involvement:No   Subjective: Having serious gastric issues that caused her to be hospitalized for four days and then had repeat trip to the ER. Doctor is setting her up with a specialist. Has lost ten pounds. Unable to keep food or liquid down. Fatigued. Family as been traumatized.   Interventions: Cognitive Behavioral Therapy, Solution-Oriented/Positive Psychology, Interpersonal and supportive  Diagnosis:   ICD-10-CM   1. Generalized anxiety disorder F41.1   2. Major depressive disorder, recurrent episode, moderate (HCC) F33.1     Plan: Maintain stable mood           Continue to have gastric issues examined and pursue treatment           Self care program           Decrease social anxiety   Ulice Boldarson Kollin Udell, [place]

## 2018-08-23 ENCOUNTER — Other Ambulatory Visit: Payer: Self-pay | Admitting: Medical

## 2018-08-23 ENCOUNTER — Other Ambulatory Visit: Payer: Self-pay | Admitting: Psychiatry

## 2018-08-24 ENCOUNTER — Other Ambulatory Visit: Payer: Self-pay | Admitting: Psychiatry

## 2018-08-24 NOTE — Telephone Encounter (Signed)
Need to check pmp

## 2018-08-24 NOTE — Telephone Encounter (Signed)
Pt requesting refill on Phenergan. Please advise.

## 2018-08-25 ENCOUNTER — Telehealth: Payer: Self-pay | Admitting: Medical

## 2018-08-25 NOTE — Telephone Encounter (Signed)
Rx phernergnan sent to pt pharmacy.

## 2018-08-25 NOTE — Telephone Encounter (Signed)
Patient states she would like this refilled so that she can eat. It helps her tolerate food. She is still waiting to get in with the GI.

## 2018-08-25 NOTE — Telephone Encounter (Signed)
Can you follow up on pt referral. Pt request update.

## 2018-08-30 ENCOUNTER — Other Ambulatory Visit: Payer: Self-pay | Admitting: Psychiatry

## 2018-08-31 ENCOUNTER — Other Ambulatory Visit: Payer: Self-pay | Admitting: Medical

## 2018-08-31 ENCOUNTER — Encounter: Payer: Self-pay | Admitting: Gastroenterology

## 2018-08-31 NOTE — Telephone Encounter (Signed)
Will see provider tomorrow

## 2018-09-01 ENCOUNTER — Ambulatory Visit: Payer: Commercial Managed Care - PPO | Admitting: Psychiatry

## 2018-09-01 NOTE — Telephone Encounter (Signed)
Pt requesting Refill on Phenergan. Please advise.

## 2018-09-01 NOTE — Telephone Encounter (Signed)
I did spend phenergan rx to pharmacy. But wondering does she have appointment scheduled with gastroenterologist.

## 2018-09-03 DIAGNOSIS — R0789 Other chest pain: Secondary | ICD-10-CM

## 2018-09-07 ENCOUNTER — Ambulatory Visit (INDEPENDENT_AMBULATORY_CARE_PROVIDER_SITE_OTHER): Payer: Commercial Managed Care - PPO | Admitting: Mental Health

## 2018-09-07 DIAGNOSIS — F331 Major depressive disorder, recurrent, moderate: Secondary | ICD-10-CM | POA: Diagnosis not present

## 2018-09-07 DIAGNOSIS — F411 Generalized anxiety disorder: Secondary | ICD-10-CM | POA: Diagnosis not present

## 2018-09-07 NOTE — Progress Notes (Signed)
      [facility] Counselor/Therapist Progress Note  Patient ID: Jessica Molina, MRN: [id number],    Date: 09/07/2018  Time Spent: 45 minutes  Treatment Type: Individual Therapy  Reported Symptoms: Depressed mood, Anxious Mood, Panic Attacks, Irritability, Fatigue, Isolation and withdrawal and Feelings of worthlessness, hopelessness  Mental Status Exam:  Appearance:   Casual     Behavior:  Appropriate and subdued  Motor:  Normal  Speech/Language:   Normal Rate  Affect:  Appropriate  Mood:  labile  Thought process:  normal  Thought content:    WNL  Sensory/Perceptual disturbances:    WNL  Orientation:  oriented to person, place and time/date  Attention:  Good  Concentration:  Fair  Memory:  WNL  Fund of knowledge:   Good  Insight:    Good  Judgment:   Good  Impulse Control:  Good   Risk Assessment: Danger to Self:  No Self-injurious Behavior: No Danger to Others: No Duty to Warn:no Physical Aggression / Violence:No  Access to Firearms a concern: No  Gang Involvement:No   Subjective:   Has days of severe depression. Has panic attacks that dont allow her to move. Concerned about weight gain. Low self esteem and negative self concept.  Interventions: Cognitive Behavioral Therapy, Solution-Oriented/Positive Psychology and Interpersonal  Diagnosis:   ICD-10-CM   1. Major depressive disorder, recurrent episode, moderate (HCC) F33.1   2. 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

## 2018-09-08 ENCOUNTER — Encounter: Payer: Self-pay | Admitting: Gastroenterology

## 2018-09-08 ENCOUNTER — Other Ambulatory Visit (INDEPENDENT_AMBULATORY_CARE_PROVIDER_SITE_OTHER): Payer: Commercial Managed Care - PPO

## 2018-09-08 ENCOUNTER — Ambulatory Visit (INDEPENDENT_AMBULATORY_CARE_PROVIDER_SITE_OTHER): Payer: Commercial Managed Care - PPO | Admitting: Gastroenterology

## 2018-09-08 ENCOUNTER — Telehealth: Payer: Self-pay | Admitting: *Deleted

## 2018-09-08 VITALS — BP 116/82 | HR 105 | Ht 63.0 in | Wt 192.1 lb

## 2018-09-08 DIAGNOSIS — R1319 Other dysphagia: Secondary | ICD-10-CM

## 2018-09-08 DIAGNOSIS — R1310 Dysphagia, unspecified: Secondary | ICD-10-CM

## 2018-09-08 DIAGNOSIS — R945 Abnormal results of liver function studies: Secondary | ICD-10-CM

## 2018-09-08 DIAGNOSIS — R7989 Other specified abnormal findings of blood chemistry: Secondary | ICD-10-CM

## 2018-09-08 DIAGNOSIS — K219 Gastro-esophageal reflux disease without esophagitis: Secondary | ICD-10-CM

## 2018-09-08 DIAGNOSIS — K760 Fatty (change of) liver, not elsewhere classified: Secondary | ICD-10-CM

## 2018-09-08 MED ORDER — DEXLANSOPRAZOLE 60 MG PO CPDR: 60.0000 mg | DELAYED_RELEASE_CAPSULE | Freq: Every day | ORAL | 3 refills | Status: DC

## 2018-09-08 NOTE — Progress Notes (Signed)
Chief Complaint:   Referring Provider:  Esperanza Richters, PA-C      ASSESSMENT AND PLAN;   #1. Esopageal dysphagia with neg Ba swallow.  Has globus sensation. CTA chest showing distal esophageal wall thickening. #2. GERD with small HH on barium swallow. #3. N/V with epigastric pain. Neg [place] 07/2018, neg CTA chest 07/2018 #4. Fatty liver on [place] 07/2018 with mildly abn AST/ALT.  Has assoc wt gain > 50lb in last 1 year. #5. IBS with alt diarrhea and constipation.  Plan: - EGD with possible dil/Bx if needed.  I have explained the risks and benefits. - Change protonix to dexilant 60 mg p.o. once a day.  Samples were given. - I have discussed in detail the etiopathogenesis of fatty liver.  Have advised her to reduce weight as only treatment for fatty liver currently.  She would watch p.o. intake and start exercising.  She will also continue to monitor weight. - Check acute hepatitis profile.  Also check hepatitis B surface antibody titer and hepatitis A total antibody.  If negative, would recommend vaccination for A and B. - Follow-up in 12 weeks.  Earlier, if still with problems. At FU, recheck liver function tests.  If still abnormal, would perform further work-up. - If still with neck/swallowing problems, consider CT scan of the neck possibly followed by esophageal manometry. - If still with epi pain, consider HIDA scan with EF.     HPI:    Jessica Molina is a 42 y.o. female  with anxiety, HTN, H/O left carotid pseudoaneurysm s/p cliping (neg FU MRA 04/2016), H/O migraines, IBS With Multiple GI problems Including globus sensation, dysphagia mostly to solids (upper neck area),  Associated N/V With epiagstic pain and occasional chest pains which gets worse after eating. Has gained over 50 pounds in the last 1 year  Had extensive work-up till now-including barium swallow showing small hiatal hernia.  13 mm pill passed without any problems.  Negative CTA of chest except for distal  esophageal thickening.  Negative right upper quadrant ultrasound except for fatty liver.  She also has longstanding history of alternating diarrhea and constipation with abdominal bloating.  Passage of pellet-like stools.  When she has diarrhea, she would have postprandial symptoms.  Diagnosed with IBS over 20 years ago had colonoscopy in [place].  She does admit that she has been under considerable stress-because of her older daughter having bulimia-failed several treatments and is currently in [place].  Coming home next week.  Reis does admit that she has been "stress eating".  No nonsteroidals.  No alcohol or any drug use.  Recent admission - 07/2018- for AKI due to dehydration.     CTA chest: IMPRESSION: 1. No evidence of pulmonary embolus. 2. No thoracic aortic dissection.  No thoracic aortic aneurysm. 3. Distal esophageal wall thickening as can be seen with esophagitis. 4. No acute cardiopulmonary disease. 5. Trace right pleural effusion. 6. Hepatic steatosis.   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  Unfortunately her 60 year old has significant bulimia and is currently in [place] for treatment.  Coming home for Christmas.  No alcohol or smoking. Past Medical History:  Diagnosis Date  . 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 Anxiety   . GERD (gastroesophageal reflux disease)   . Hypertension   . Migraine   . PONV (postoperative nausea and vomiting)    hard to wake up  after appendectomy  . Renal vein stenosis    "Kinked"    Past Surgical History:  Procedure Laterality Date  . ABDOMINAL HYSTERECTOMY    . ANEURYSM SURGERY  01/2015  . APPENDECTOMY    . BTL  2006  . CESAREAN SECTION     Twins,  BTL  . COLONOSCOPY     Over 20 years in [place]   . CYSTOSCOPY N/A 06/25/2016   Procedure: CYSTOSCOPY;  Surgeon: Dara Lords, MD;  Location: [facility];   Service: Gynecology;  Laterality: N/A;  . LAPAROSCOPIC VAGINAL HYSTERECTOMY WITH SALPINGECTOMY Bilateral 06/25/2016   Procedure: LAPAROSCOPIC ASSISTED VAGINAL HYSTERECTOMY WITH Bilateral SALPINGECTOMY, Lysis of Adhesions;  Surgeon: Dara Lords, MD;  Location: [facility];  Service: Gynecology;  Laterality: Bilateral;  . RENAL ANGIOPLASTY     x2    Family History  Problem Relation Age of Onset  . Breast cancer Mother        breast   . Thyroid disease Mother   . Cancer Mother 15       breast  . Hyperlipidemia Mother   . Hypertension Father   . Stroke Father   . Melanoma Father        melanoma it is terminal   . Cancer Father        melanoma  . Breast cancer Maternal Grandmother        breast   . Thyroid disease Maternal Grandmother   . Cancer Maternal Grandfather        unknown   . Cancer Paternal Grandmother        unknow   . Colon cancer Neg Hx   . Esophageal cancer Neg Hx   . Esophageal varices Neg Hx     Social History   Tobacco Use  . Smoking status: Never Smoker  . Smokeless tobacco: Never Used  Substance Use Topics  . Alcohol use: Yes    Alcohol/week: 0.0 standard drinks    Comment: 2-3 drinks per week  . Drug use: No    Current Outpatient Medications  Medication Sig Dispense Refill  . aspirin EC 81 MG EC tablet Take 1 tablet (81 mg total) by mouth daily. 30 tablet 0  . clonazePAM (KLONOPIN) 0.5 MG tablet TAKE 1 TABLET BY MOUTH TWICE A DAY AND 1 TO 2 TABLETS AT BEDTIME AS NEEDED 120 tablet 1  . doxepin (SINEQUAN) 10 MG capsule Take 10 mg by mouth at bedtime.    . Fremanezumab-vfrm (AJOVY) 225 MG/1.5ML SOSY Inject 225 mg into the skin every 30 (thirty) days. 1 mL 11  . hydrochlorothiazide (HYDRODIURIL) 25 MG tablet Take 1 tablet (25 mg total) by mouth daily. 90 tablet 1  . losartan (COZAAR) 100 MG tablet Take 1 tablet (100 mg total) by mouth daily. 90 tablet 1  . Multiple Vitamin (MULTIVITAMIN WITH MINERALS) TABS tablet Take 1 tablet by mouth daily.    .  pantoprazole (PROTONIX) 40 MG tablet Take 1 tablet (40 mg total) by mouth daily. 30 tablet 1  . promethazine (PHENERGAN) 12.5 MG tablet TAKE 1 TABLET BY MOUTH EVERY 8 HRS AS NEEDED FOR NAUSEA OR VOMITING. 12 tablet 0  . sucralfate (CARAFATE) 1 GM/10ML suspension Take 10 mLs (1 g total) by mouth 4 (four) times daily -  with meals and at bedtime. 420 mL 0  . tiZANidine (ZANAFLEX) 4 MG tablet Take 1 tablet (4 mg total) by mouth every 6 (six) hours as needed for muscle spasms. 30 tablet 11   No current  facility-administered medications for this visit.     Allergies  Allergen Reactions  . Lisinopril Cough  . Metoprolol Other (See Comments)    did not feel well when taking     Review of Systems:  Constitutional: Denies fever, chills, diaphoresis, appetite change and fatigue.  HEENT: Denies photophobia, eye pain, redness, hearing loss, ear pain, congestion, sore throat, rhinorrhea, sneezing, mouth sores, neck pain, neck stiffness and tinnitus.   Respiratory: Denies SOB, DOE, cough, chest tightness,  and wheezing.   Cardiovascular: Denies chest pain, palpitations and leg swelling.  Genitourinary: Denies dysuria, urgency, frequency, hematuria, flank pain and difficulty urinating.  Musculoskeletal: Denies myalgias, back pain, joint swelling, arthralgias and gait problem.  Skin: No rash.  Neurological: Denies dizziness, seizures, syncope, weakness, light-headedness, numbness and has headaches.  Hematological: Denies adenopathy. Easy bruising, personal or family bleeding history  Psychiatric/Behavioral: Has anxiety or depression     Physical Exam:    BP 116/82   Pulse (!) 105   Ht 5\' 3"  (1.6 m)   Wt 192 lb 2 oz (87.1 kg)   LMP 06/19/2016 (Exact Date)   BMI 34.03 kg/m  Filed Weights   09/08/18 0836  Weight: 192 lb 2 oz (87.1 kg)   Constitutional:  Well-developed, in no acute distress. Psychiatric: Normal mood and affect. Behavior is normal. HEENT: Pupils normal.  Conjunctivae are  normal. No scleral icterus. Neck supple.  Thyroid was more prominent Cardiovascular: Normal rate, regular rhythm. No edema Pulmonary/chest: Effort normal and breath sounds normal. No wheezing, rales or rhonchi. Abdominal: Soft, nondistended. Nontender. Bowel sounds active throughout. There are no masses palpable. No hepatomegaly. Rectal:  defered Neurological: Alert and oriented to person place and time. Skin: Skin is warm and dry. No rashes noted.  Data Reviewed: I have personally reviewed following labs and imaging studies  CBC: CBC Latest Ref Rng & Units 08/20/2018 08/18/2018 08/16/2018  WBC 4.0 - 10.5 K/uL 7.3 10.0 5.3  Hemoglobin 12.0 - 15.0 g/dL 45.4 09.8 10.6(L)  Hematocrit 36.0 - 46.0 % 37.9 37.4 32.2(L)  Platelets 150.0 - 400.0 K/uL 487.0(H) 457(H) 376    CMP: CMP Latest Ref Rng & Units 08/20/2018 08/18/2018 08/16/2018  Glucose 70 - 99 mg/dL 78 119(J) 478(G)  BUN 6 - 23 mg/dL 11 12 6   Creatinine 0.40 - 1.20 mg/dL 9.56 2.13(Y) 8.65(H)  Sodium 135 - 145 mEq/L 139 139 138  Potassium 3.5 - 5.1 mEq/L 4.2 3.1(L) 4.0  Chloride 96 - 112 mEq/L 102 103 110  CO2 19 - 32 mEq/L 24 24 21(L)  Calcium 8.4 - 10.5 mg/dL 84.6 9.8 8.1(L)  Total Protein 6.0 - 8.3 g/dL 7.4 9.6(E) -  Total Bilirubin 0.2 - 1.2 mg/dL 0.4 0.6 -  Alkaline Phos 39 - 117 U/L 65 62 -  AST 0 - 37 U/L 41(H) 48(H) -  ALT 0 - 35 U/L 68(H) 64(H) -    GFR: Estimated Creatinine Clearance: 69.7 mL/min (by C-G formula based on SCr of 1.09 mg/dL). Liver Function Tests:    Radiology Studies: Dg Chest 2 View  Result Date: 08/18/2018 CLINICAL DATA:  Chest pain for 1 week.  Nausea and vomiting. EXAM: CHEST - 2 VIEW COMPARISON:  Chest radiographs and CT 08/14/2018. FINDINGS: The heart size and mediastinal contours are stable. There is interval improved aeration of the lung bases without significant residual atelectasis. There is no airspace disease, pleural effusion or pneumothorax. Telemetry leads overlie the chest. No  acute osseous findings are demonstrated. IMPRESSION: No active cardiopulmonary process. Electronically Signed  By: Carey Bullocks M.D.   On: 08/18/2018 12:56   Dg Chest 2 View  Result Date: 08/14/2018 CLINICAL DATA:  Shortness of breath. Bilateral lower extremity swelling. EXAM: CHEST - 2 VIEW COMPARISON:  October 22, 2016 FINDINGS: The heart size and mediastinal contours are within normal limits. Both lungs are clear. The visualized skeletal structures are unremarkable. IMPRESSION: No active cardiopulmonary disease. Electronically Signed   By: Gerome Sam III M.D   On: 08/14/2018 00:20   Dg Abd 1 View  Result Date: 08/13/2018 CLINICAL DATA:  Pt states that she has been having pain and swelling in her calves, ankles, and feet. She is having loose stools, heart burn, a feeling of needing to burp, not being able to hold any food down, abdominal bloating and distention all for about a week. EXAM: ABDOMEN - 1 VIEW COMPARISON:  CT of the abdomen and pelvis on 06/07/2016 FINDINGS: The bowel gas pattern is normal. No radio-opaque calculi or other significant radiographic abnormality are seen. IMPRESSION: Negative. Electronically Signed   By: Norva Pavlov M.D.   On: 08/13/2018 16:38   Dg Esophagus  Result Date: 08/15/2018 CLINICAL DATA:  Dysphagia EXAM: ESOPHOGRAM / BARIUM SWALLOW / BARIUM TABLET STUDY TECHNIQUE: Combined double contrast and single contrast examination performed using effervescent crystals, thick barium liquid, and thin barium liquid. The patient was observed with fluoroscopy swallowing a 13 mm barium sulphate tablet. FLUOROSCOPY TIME:  Fluoroscopy Time:  1 minutes 42 seconds Radiation Exposure Index (if provided by the fluoroscopic device): 21.4 mGy Number of Acquired Spot Images: 10 COMPARISON:  None. FINDINGS: Normal esophageal peristalsis. No fixed esophageal narrowing or stricture. A barium tablet passed into the stomach without delay. Tiny hiatal hernia on the prone swallows.  Associated mild gastroesophageal reflux. IMPRESSION: Tiny hiatal hernia.  Associated mild gastroesophageal reflux. Otherwise negative barium swallow. No fixed esophageal narrowing/stricture. Electronically Signed   By: Charline Bills M.D.   On: 08/15/2018 15:19   Nm Pulmonary Perf And Vent  Result Date: 08/14/2018 CLINICAL DATA:  Shortness of breath and chest pain EXAM: NUCLEAR MEDICINE VENTILATION - PERFUSION LUNG SCAN VIEWS: Anterior, posterior, left lateral, right lateral, RPO, LPO, RAO, LAO-ventilation and perfusion RADIOPHARMACEUTICALS:  32.0 mCi of Tc-74m DTPA aerosol inhalation and 4.2 mCi Tc68m MAA IV COMPARISON:  Chest radiograph August 14, 2018 FINDINGS: Ventilation: Radiotracer uptake is homogeneous and symmetric bilaterally. No ventilation defects are evident. Perfusion: Radiotracer uptake is homogeneous and symmetric bilaterally. No perfusion defects are evident. IMPRESSION: No appreciable ventilation or perfusion defects. Very low probability of pulmonary embolus. Electronically Signed   By: Bretta Bang III M.D.   On: 08/14/2018 10:04   US Venous Img Lower Bilateral  Result Date: 08/14/2018 CLINICAL DATA:  Lower extremity swelling. EXAM: BILATERAL LOWER EXTREMITY VENOUS DOPPLER ULTRASOUND TECHNIQUE: Gray-scale sonography with graded compression, as well as color Doppler and duplex ultrasound were performed to evaluate the lower extremity deep venous systems from the level of the common femoral vein and including the common femoral, femoral, profunda femoral, popliteal and calf veins including the posterior tibial, peroneal and gastrocnemius veins when visible. The superficial great saphenous vein was also interrogated. Spectral Doppler was utilized to evaluate flow at rest and with distal augmentation maneuvers in the common femoral, femoral and popliteal veins. COMPARISON:  None. FINDINGS: RIGHT LOWER EXTREMITY Common Femoral Vein: No evidence of thrombus. Normal compressibility,  respiratory phasicity and response to augmentation. Saphenofemoral Junction: No evidence of thrombus. Normal compressibility and flow on color Doppler imaging. Profunda Femoral Vein: No  evidence of thrombus. Normal compressibility and flow on color Doppler imaging. Femoral Vein: No evidence of thrombus. Normal compressibility, respiratory phasicity and response to augmentation. Popliteal Vein: No evidence of thrombus. Normal compressibility, respiratory phasicity and response to augmentation. Calf Veins: No evidence of thrombus. Normal compressibility and flow on color Doppler imaging. Superficial Great Saphenous Vein: No evidence of thrombus. Normal compressibility. Venous Reflux:  None. Other Findings:  None. LEFT LOWER EXTREMITY Common Femoral Vein: No evidence of thrombus. Normal compressibility, respiratory phasicity and response to augmentation. Saphenofemoral Junction: No evidence of thrombus. Normal compressibility and flow on color Doppler imaging. Profunda Femoral Vein: No evidence of thrombus. Normal compressibility and flow on color Doppler imaging. Femoral Vein: No evidence of thrombus. Normal compressibility, respiratory phasicity and response to augmentation. Popliteal Vein: No evidence of thrombus. Normal compressibility, respiratory phasicity and response to augmentation. Calf Veins: No evidence of thrombus. Normal compressibility and flow on color Doppler imaging. Superficial Great Saphenous Vein: No evidence of thrombus. Normal compressibility. Venous Reflux:  None. Other Findings:  None. IMPRESSION: No evidence of deep venous thrombosis. Electronically Signed   By: Gerome Samavid  Williams III M.D   On: 08/14/2018 00:21   Mm 3d Screen Breast Bilateral  Result Date: 08/14/2018 CLINICAL DATA:  Screening. EXAM: DIGITAL SCREENING BILATERAL MAMMOGRAM WITH TOMO AND CAD COMPARISON:  Previous exam(s). ACR Breast Density Category b: There are scattered areas of fibroglandular density. FINDINGS: There are no  findings suspicious for malignancy. Images were processed with CAD. IMPRESSION: No mammographic evidence of malignancy. A result letter of this screening mammogram will be mailed directly to the patient. RECOMMENDATION: Screening mammogram in one year. (Code:SM-B-01Y) BI-RADS CATEGORY  1: Negative. Electronically Signed   By: Frederico HammanMichelle  Collins M.D.   On: 08/14/2018 10:32   Ct Angio Chest Aorta W/cm &/or Wo/cm  Result Date: 08/14/2018 CLINICAL DATA:  Sharp chest pain. History of carotid artery aneurysm versus dissection. EXAM: CT ANGIOGRAPHY CHEST WITH CONTRAST TECHNIQUE: Multidetector CT imaging of the chest was performed using the standard protocol during bolus administration of intravenous contrast. Multiplanar CT image reconstructions and MIPs were obtained to evaluate the vascular anatomy. CONTRAST:  100mL ISOVUE-370 IOPAMIDOL (ISOVUE-370) INJECTION 76% COMPARISON:  None. FINDINGS: Cardiovascular: Satisfactory opacification of the pulmonary arteries to the segmental level. No evidence of pulmonary embolism. Normal heart size. No pericardial effusion. Thoracic aorta is normal in caliber. No thoracic aortic aneurysm or dissection. Mediastinum/Nodes: No mediastinal, hilar or axillary lymphadenopathy. Thyroid gland and trachea are normal. Distal esophageal wall thickening as can be seen with esophagitis. Lungs/Pleura: Trace right pleural effusion. Bibasilar atelectasis. No focal consolidation. No pneumothorax. Upper Abdomen: No acute upper abdominal abnormality. Diffuse low attenuation of the liver as can be seen with hepatic steatosis. Musculoskeletal: No chest wall abnormality. No acute or significant osseous findings. Review of the MIP images confirms the above findings. IMPRESSION: 1. No evidence of pulmonary embolus. 2. No thoracic aortic dissection.  No thoracic aortic aneurysm. 3. Distal esophageal wall thickening as can be seen with esophagitis. 4. No acute cardiopulmonary disease. 5. Trace right  pleural effusion. 6. Hepatic steatosis. Electronically Signed   By: Elige KoHetal  Patel   On: 08/14/2018 19:31   [place] Abdomen Limited Ruq  Result Date: 08/18/2018 CLINICAL DATA:  Epigastric abdominal pain with nausea and vomiting. EXAM: ULTRASOUND ABDOMEN LIMITED RIGHT UPPER QUADRANT COMPARISON:  Multiple exams, including 06/10/2016 FINDINGS: Gallbladder: No gallstones or wall thickening visualized. No sonographic Murphy sign noted by sonographer. Common bile duct: Diameter: 4 mm Liver: No focal lesion identified. Coarse  echogenic liver with poor sonic penetration compatible with diffuse hepatic steatosis. Portal vein is patent on color Doppler imaging with normal direction of blood flow towards the liver. IMPRESSION: 1. Coarse echogenic liver with poor sonic penetration compatible with diffuse hepatic steatosis. Electronically Signed   By: Gaylyn Rong M.D.   On: 08/18/2018 13:17  I have reviewed the above CT, barium swallow personally.    Edman Circle, MD 09/08/2018, 8:53 AM  Cc: Esperanza Richters, PA-C

## 2018-09-08 NOTE — Addendum Note (Signed)
Addended by: Junius RoadsWILLIAMS, Keyanah Kozicki J on: 09/08/2018 10:43 AM   Modules accepted: Orders

## 2018-09-08 NOTE — Patient Instructions (Addendum)
If you are age 43 or older, your body mass index should be between 23-30. Your Body mass index is 34.03 kg/m. If this is out of the aforementioned range listed, please consider follow up with your Primary Care Provider.  If you are age 43 or younger, your body mass index should be between 19-25. Your Body mass index is 34.03 kg/m. If this is out of the aformentioned range listed, please consider follow up with your Primary Care Provider.   You have been scheduled for an endoscopy. Please follow written instructions given to you at your visit today. If you use inhalers (even only as needed), please bring them with you on the day of your procedure. Your physician has requested that you go to www.[email or web address].[email or web address] and enter the access code given to you at your visit today. This web site gives a general overview about your procedure. However, you should still follow specific instructions given to you by our office regarding your preparation for the procedure.  We have given you samples of the following medication to take: Dexilant 60 mg once daily for 2 weeks  Please go to the lab on the 2nd floor [address] before you leave the office today.    Thank you,  Dr. Lynann Bolognaajesh Gupta

## 2018-09-08 NOTE — Telephone Encounter (Signed)
Initiated [employer] PA on covermymeds. Key: [id number]. In process of completing.

## 2018-09-08 NOTE — Telephone Encounter (Signed)
PA approved effective 09/08/2018 - 09/09/2019. Faxed notice of approval to [facility] at [phone]. Received fax confirmation.

## 2018-09-09 ENCOUNTER — Other Ambulatory Visit: Payer: Self-pay

## 2018-09-09 LAB — HEPATITIS PANEL, ACUTE
Hep A IgM: NONREACTIVE
Hep B C IgM: NONREACTIVE
Hepatitis B Surface Ag: NONREACTIVE
Hepatitis C Ab: NONREACTIVE
SIGNAL TO CUT-OFF: 0.02 (ref <1.00)

## 2018-09-09 LAB — HEPATITIS B SURFACE ANTIBODY,QUALITATIVE: Hep B S Ab: REACTIVE — AB

## 2018-09-09 LAB — HEPATITIS A ANTIBODY, TOTAL: Hepatitis A AB,Total: NONREACTIVE

## 2018-09-10 ENCOUNTER — Other Ambulatory Visit: Payer: Commercial Managed Care - PPO

## 2018-09-10 ENCOUNTER — Other Ambulatory Visit: Payer: Self-pay | Admitting: Medical

## 2018-09-10 DIAGNOSIS — R197 Diarrhea, unspecified: Secondary | ICD-10-CM

## 2018-09-11 NOTE — Progress Notes (Signed)
I called pt but she was driving and she will call us back to schedule appt.

## 2018-09-14 LAB — GASTROINTESTINAL PATHOGEN PANEL PCR
C. difficile Tox A/B, PCR: NOT DETECTED
Campylobacter, PCR: NOT DETECTED
Cryptosporidium, PCR: NOT DETECTED
E coli (ETEC) LT/ST PCR: NOT DETECTED
E coli (STEC) stx1/stx2, PCR: NOT DETECTED
E coli 0157, PCR: NOT DETECTED
Giardia lamblia, PCR: NOT DETECTED
Norovirus, PCR: NOT DETECTED
Rotavirus A, PCR: NOT DETECTED
Salmonella, PCR: NOT DETECTED
Shigella, PCR: NOT DETECTED

## 2018-09-15 ENCOUNTER — Ambulatory Visit (INDEPENDENT_AMBULATORY_CARE_PROVIDER_SITE_OTHER): Payer: Commercial Managed Care - PPO | Admitting: Psychiatry

## 2018-09-15 ENCOUNTER — Encounter: Payer: Self-pay | Admitting: Psychiatry

## 2018-09-15 DIAGNOSIS — F331 Major depressive disorder, recurrent, moderate: Secondary | ICD-10-CM

## 2018-09-15 DIAGNOSIS — F411 Generalized anxiety disorder: Secondary | ICD-10-CM

## 2018-09-15 DIAGNOSIS — F4001 Agoraphobia with panic disorder: Secondary | ICD-10-CM | POA: Diagnosis not present

## 2018-09-15 NOTE — Patient Instructions (Signed)
Trintellix 10 mg once daily for 2 weeks, then increase to 20 mg daily.

## 2018-09-15 NOTE — Progress Notes (Signed)
Jessica Molina [id number] 08-14-1976 42 y.o.  Subjective:   Patient ID:  Jessica Molina is a 42 y.o. (DOB 12/26/1975) female.  Chief Complaint:  Chief Complaint  Patient presents with  . Follow-up    Medication Management  . Anxiety  . Depression    HPI last seen in March. Jessica Molina presents to the office today for follow-up of depression and anxiety and is markedly worse.  Has not kept appts here. Return of NM for 3 weeks almost nightly.  Doxazosin retried without help.Anxiety is higher bc D home from treatment and she weaned off the Paxil.  Crying a lot, stressed.  [facility] was not the right fit for therapy.  D started therapy also.  Pt changing therapist.  She weaned off the Paxil DT urination retention.  Resolved off the Paxil.  Been off for several months.  Recent hosp for hiatal hernia and chest pain and fear of MI ruled out.  N/V unless takes Zofran.  This occurring over a month.  Pt reports that mood is Anxious and Depressed and describes anxiety as Severe. Anxiety symptoms include: Excessive Worry, Panic Symptoms,. Pt reports no sleep issues. Pt reports that appetite is good. Pt reports that energy is lethargic and down slightly and loss of interest or pleasure in usual activities. Concentration is down slightly. Suicidal thoughts:  denied by patient.  Some death thoughts.  Weight gain DT lack of activity.  Social anxiety over the weight gain.  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  Another D is bulimic.  Both are high maintenance.  Has to watch D 24/7.  Patient had a personal meltdown relation to her daughters behavior last night.  Patient does not feel she can control her own reactions but is not going to hurt herself nor her daughter.  She feels that this is due to the severity of her own depression and anxiety.  Hx paxil but caused urinary retention, Zoloft without help at 150 and some wt gain, Buspar NR. 2 D's on Trintellix helped 1 of the 2  kids.  Review of Systems:  Review of Systems  Constitutional: Positive for fatigue.  Cardiovascular: Positive for chest pain.  Gastrointestinal: Positive for nausea and vomiting.  Psychiatric/Behavioral: Positive for dysphoric mood. Negative for agitation, behavioral problems, confusion, decreased concentration, hallucinations, self-injury, sleep disturbance and suicidal ideas. The patient is nervous/anxious. The patient is not hyperactive.     Medications: I have reviewed the patient's current medications.  Current Outpatient Medications  Medication Sig Dispense Refill  . ARIPiprazole (ABILIFY) 5 MG tablet Take 5 mg by mouth daily.    . clonazePAM (KLONOPIN) 0.5 MG tablet TAKE 1 TABLET BY MOUTH TWICE A DAY AND 1 TO 2 TABLETS AT BEDTIME AS NEEDED 120 tablet 1  . doxepin (SINEQUAN) 10 MG capsule Take 10 mg by mouth at bedtime.    . Fremanezumab-vfrm (AJOVY) 225 MG/1.5ML SOSY Inject 225 mg into the skin every 30 (thirty) days. 1 mL 11  . hydrochlorothiazide (HYDRODIURIL) 25 MG tablet Take 1 tablet (25 mg total) by mouth daily. 90 tablet 1  . losartan (COZAAR) 100 MG tablet Take 1 tablet (100 mg total) by mouth daily. 90 tablet 1  . Multiple Vitamin (MULTIVITAMIN WITH MINERALS) TABS tablet Take 1 tablet by mouth daily.    . sucralfate (CARAFATE) 1 GM/10ML suspension Take 10 mLs (1 g total) by mouth 4 (four) times daily -  with meals and at bedtime. 420 mL 0  . tiZANidine (ZANAFLEX) 4  MG tablet Take 1 tablet (4 mg total) by mouth every 6 (six) hours as needed for muscle spasms. 30 tablet 11  . aspirin EC 81 MG EC tablet Take 1 tablet (81 mg total) by mouth daily. (Patient not taking: Reported on 09/15/2018) 30 tablet 0  . dexlansoprazole (DEXILANT) 60 MG capsule Take 1 capsule (60 mg total) by mouth daily. (Patient not taking: Reported on 09/15/2018) 15 capsule 3  . pantoprazole (PROTONIX) 40 MG tablet Take 1 tablet (40 mg total) by mouth daily. (Patient not taking: Reported on 09/15/2018) 30  tablet 1  . promethazine (PHENERGAN) 12.5 MG tablet TAKE 1 TABLET BY MOUTH EVERY 8 HRS AS NEEDED FOR NAUSEA OR VOMITING. (Patient not taking: Reported on 09/15/2018) 12 tablet 0  . traMADol (ULTRAM) 50 MG tablet TAKE 1 TABLET BY MOUTH EVERY 6 HRS AS NEEDED. MUST LAST 30 DAYS  0   No current facility-administered medications for this visit.     Medication Side Effects: None  Allergies:  Allergies  Allergen Reactions  . Lisinopril Cough  . Metoprolol Other (See Comments)    did not feel well when taking     Past Medical History:  Diagnosis Date  . 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 Anxiety   . GERD (gastroesophageal reflux disease)   . Hypertension   . Migraine   . PONV (postoperative nausea and vomiting)    hard to wake up after appendectomy  . Renal vein stenosis    "Kinked"    Family History  Problem Relation Age of Onset  . Breast cancer Mother        breast   . Thyroid disease Mother   . Cancer Mother 11       breast  . Hyperlipidemia Mother   . Hypertension Father   . Stroke Father   . Melanoma Father        melanoma it is terminal   . Cancer Father        melanoma  . Breast cancer Maternal Grandmother        breast   . Thyroid disease Maternal Grandmother   . Cancer Maternal Grandfather        unknown   . Cancer Paternal Grandmother        unknow   . Colon cancer Neg Hx   . Esophageal cancer Neg Hx   . Esophageal varices Neg Hx     Social History   Socioeconomic History  . Marital status: Married    Spouse name: Not on file  . Number of children: 4  . Years of education: HS+  . Highest education level: Not on file  Occupational History  . Occupation: Psychologist, sport and exercise  Social Needs  . Financial resource strain: Not on file  . Food insecurity:    Worry: Not on file    Inability: Not on file  . Transportation needs:    Medical: Not on file    Non-medical: Not on file  Tobacco  Use  . Smoking status: Never Smoker  . Smokeless tobacco: Never Used  Substance and Sexual Activity  . Alcohol use: Yes    Alcohol/week: 0.0 standard drinks    Comment: 2-3 drinks per week  . Drug use: No  . Sexual activity: Yes    Partners: Male    Birth control/protection: Surgical  Lifestyle  . Physical activity:    Days per week: Not on file  Minutes per session: Not on file  . Stress: Not on file  Relationships  . Social connections:    Talks on phone: Not on file    Gets together: Not on file    Attends religious service: Not on file    Active member of club or organization: Not on file    Attends meetings of clubs or organizations: Not on file    Relationship status: Not on file  . Intimate partner violence:    Fear of current or ex partner: Not on file    Emotionally abused: Not on file    Physically abused: Not on file    Forced sexual activity: Not on file  Other Topics Concern  . Not on file  Social History Narrative   Lives at home with husband and children.   Right-handed.   2-3 cups caffeine per week.    Past Medical History, Surgical history, Social history, and Family history were reviewed and updated as appropriate.   Please see review of systems for further details on the patient's review from today.   Objective:   Physical Exam:  LMP 06/19/2016 (Exact Date)   Physical Exam Constitutional:      General: She is not in acute distress.    Appearance: She is well-developed.  Musculoskeletal:        General: No deformity.  Neurological:     Mental Status: She is alert and oriented to person, place, and time.     Motor: No tremor.     Coordination: Coordination normal.     Gait: Gait normal.  Psychiatric:        Attention and Perception: Attention and perception normal.        Mood and Affect: Mood is anxious and depressed. Affect is not labile, blunt, angry or inappropriate.        Speech: Speech normal.        Behavior: Behavior normal.         Thought Content: Thought content normal. Thought content does not include homicidal or suicidal ideation. Thought content does not include homicidal or suicidal plan.        Cognition and Memory: Cognition normal.        Judgment: Judgment normal.     Comments: Insight intact. No auditory or visual hallucinations. No delusions.      Lab Review:     Component Value Date/Time   NA 139 08/20/2018 1424   K 4.2 08/20/2018 1424   CL 102 08/20/2018 1424   CO2 24 08/20/2018 1424   GLUCOSE 78 08/20/2018 1424   BUN 11 08/20/2018 1424   CREATININE 1.09 08/20/2018 1424   CALCIUM 10.5 08/20/2018 1424   PROT 7.4 08/20/2018 1424   ALBUMIN 4.7 08/20/2018 1424   AST 41 (H) 08/20/2018 1424   ALT 68 (H) 08/20/2018 1424   ALKPHOS 65 08/20/2018 1424   BILITOT 0.4 08/20/2018 1424   GFRNONAA 55 (L) 08/18/2018 1325   GFRAA >60 08/18/2018 1325       Component Value Date/Time   WBC 7.3 08/20/2018 1424   RBC 4.14 08/20/2018 1424   HGB 12.8 08/20/2018 1424   HCT 37.9 08/20/2018 1424   PLT 487.0 (H) 08/20/2018 1424   MCV 91.5 08/20/2018 1424   MCH 30.4 08/18/2018 1325   MCHC 33.9 08/20/2018 1424   RDW 12.6 08/20/2018 1424   LYMPHSABS 2.2 08/20/2018 1424   MONOABS 0.4 08/20/2018 1424   EOSABS 0.1 08/20/2018 1424   BASOSABS 0.1 08/20/2018 1424  No results found for: POCLITH, LITHIUM   No results found for: PHENYTOIN, PHENOBARB, VALPROATE, CBMZ   .res Assessment: Plan:    Panic disorder with agoraphobia  Major depressive disorder, recurrent episode, moderate (HCC)  Generalized anxiety disorder   Emphasize the need to keep appointments and especially when she is not doing well.  She had benefit from the Paxil but had side effects affecting urination and took herself off of it.  Trial trintellix dT D's on it and low weight gain.  Pt has history of EDO issues also.  Data supports better anxiety beneifit with higher dosages. Trintellix 10 mg once daily for 2 weeks, then increase to  20 mg daily.    We discussed the short-term risks associated with benzodiazepines including sedation and increased fall risk among others.  Discussed long-term side effect risk including dependence, potential withdrawal symptoms, and the potential eventual dose-related risk of dementia.  Agree with counselor.  Likes new therapist.  FU 8 weeks.  Meredith Staggersarey Cottle, MD, DFAPA   Please see After Visit Summary for patient specific instructions.  Future Appointments  Date Time Provider Department Center  09/18/2018 10:30 AM Lynann BolognaGupta, Rajesh, MD [facility]  11/03/2018 10:15 AM Cottle, Steva Readyarey G Jr., MD [facility] None    No orders of the defined types were placed in this encounter.     -------------------------------

## 2018-09-18 ENCOUNTER — Encounter: Payer: Self-pay | Admitting: Gastroenterology

## 2018-09-18 ENCOUNTER — Ambulatory Visit (AMBULATORY_SURGERY_CENTER): Payer: Commercial Managed Care - PPO | Admitting: Gastroenterology

## 2018-09-18 VITALS — BP 131/82 | HR 68 | Temp 98.2°F | Resp 15 | Ht 63.0 in | Wt 192.0 lb

## 2018-09-18 DIAGNOSIS — R1310 Dysphagia, unspecified: Secondary | ICD-10-CM

## 2018-09-18 DIAGNOSIS — K2970 Gastritis, unspecified, without bleeding: Secondary | ICD-10-CM | POA: Diagnosis present

## 2018-09-18 DIAGNOSIS — K200 Eosinophilic esophagitis: Secondary | ICD-10-CM | POA: Diagnosis not present

## 2018-09-18 DIAGNOSIS — R945 Abnormal results of liver function studies: Secondary | ICD-10-CM

## 2018-09-18 DIAGNOSIS — K3189 Other diseases of stomach and duodenum: Secondary | ICD-10-CM | POA: Diagnosis not present

## 2018-09-18 MED ORDER — SODIUM CHLORIDE 0.9 % IV SOLN: 500.0000 mL | Freq: Once | INTRAVENOUS | Status: DC

## 2018-09-18 MED ORDER — PANTOPRAZOLE SODIUM 40 MG PO TBEC: 40.0000 mg | DELAYED_RELEASE_TABLET | Freq: Every day | ORAL | 6 refills | Status: DC

## 2018-09-18 NOTE — Progress Notes (Signed)
Report given to PACU, vss 

## 2018-09-18 NOTE — Patient Instructions (Signed)
YOU HAD AN ENDOSCOPIC PROCEDURE TODAY AT THE [facility]:   Refer to the procedure report that was given to you for any specific questions about what was found during the examination.  If the procedure report does not answer your questions, please call your gastroenterologist to clarify.  If you requested that your care partner not be given the details of your procedure findings, then the procedure report has been included in a sealed envelope for you to review at your convenience later.  YOU SHOULD EXPECT: Some feelings of bloating in the abdomen. Passage of more gas than usual.  Walking can help get rid of the air that was put into your GI tract during the procedure and reduce the bloating. If you had a lower endoscopy (such as a colonoscopy or flexible sigmoidoscopy) you may notice spotting of blood in your stool or on the toilet paper. If you underwent a bowel prep for your procedure, you may not have a normal bowel movement for a few days.  Please Note:  You might notice some irritation and congestion in your nose or some drainage.  This is from the oxygen used during your procedure.  There is no need for concern and it should clear up in a day or so.  SYMPTOMS TO REPORT IMMEDIATELY:   Following upper endoscopy (EGD)  Vomiting of blood or coffee ground material  New chest pain or pain under the shoulder blades  Painful or persistently difficult swallowing  New shortness of breath  Fever of 100F or higher  Black, tarry-looking stools  For urgent or emergent issues, a gastroenterologist can be reached at any hour by calling [phone].   DIET:  We do recommend a small meal at first, but then you may proceed to your regular diet.  Drink plenty of fluids but you should avoid alcoholic beverages for 24 hours.  ACTIVITY:  You should plan to take it easy for the rest of today and you should NOT DRIVE or use heavy machinery until tomorrow (because of the sedation medicines used  during the test).    FOLLOW UP: Our staff will call the number listed on your records the next business day following your procedure to check on you and address any questions or concerns that you may have regarding the information given to you following your procedure. If we do not reach you, we will leave a message.  However, if you are feeling well and you are not experiencing any problems, there is no need to return our call.  We will assume that you have returned to your regular daily activities without incident.  If any biopsies were taken you will be contacted by phone or by letter within the next 1-3 weeks.  Please call us at [phone] if you have not heard about the biopsies in 3 weeks.    SIGNATURES/CONFIDENTIALITY: You and/or your care partner have signed paperwork which will be entered into your electronic medical record.  These signatures attest to the fact that that the information above on your After Visit Summary has been reviewed and is understood.  Full responsibility of the confidentiality of this discharge information lies with you and/or your care-partner. 

## 2018-09-18 NOTE — Op Note (Signed)
[facility] Patient Name: Jessica Molina Procedure Date: 09/18/2018 10:11 AM MRN: [id number] Endoscopist: Lynann Bologna , MD Age: 42 Referring MD:  Date of Birth: 06/17/1976 Gender: Female Account #: [id number] Procedure:                Upper GI endoscopy Indications:              #1. Esopageal dysphagia with neg Ba swallow. Has                            globus sensation. CTA chest showing distal                            esophageal wall thickening.                           #2. GERD with small HH on barium swallow.                           #3. N/V with epigastric pain. Neg [place] 07/2018, neg                            CTA chest 07/2018. Medicines:                Monitored Anesthesia Care Procedure:                Pre-Anesthesia Assessment:                           - Prior to the procedure, a History and Physical                            was performed, and patient medications and                            allergies were reviewed. The patient's tolerance of                            previous anesthesia was also reviewed. The risks                            and benefits of the procedure and the sedation                            options and risks were discussed with the patient.                            All questions were answered, and informed consent                            was obtained. Prior Anticoagulants: The patient has                            taken no previous anticoagulant or antiplatelet  agents. ASA Grade Assessment: II - A patient with                            mild systemic disease. After reviewing the risks                            and benefits, the patient was deemed in                            satisfactory condition to undergo the procedure.                           After obtaining informed consent, the endoscope was                            passed under direct vision. Throughout the   procedure, the patient's blood pressure, pulse, and                            oxygen saturations were monitored continuously. The                            Endoscope was introduced through the mouth, and                            advanced to the second part of duodenum. The upper                            GI endoscopy was accomplished without difficulty.                            The patient tolerated the procedure well. Scope In: Scope Out: Findings:                 No endoscopic abnormality was evident in the                            esophagus to explain the patient's complaint of                            dysphagia. It was decided, however, to proceed with                            dilation of the entire esophagus. The scope was                            withdrawn. Dilation was performed with a Maloney                            dilator with mild resistance at 50 Fr. Biopsies                            were taken with a cold forceps for histology.  Estimated blood loss: none.                           The Z-line was regular and was found 35 cm from the                            incisors. No hiatal hernia was visualized. Lower                            end of the esophagus was also examined by                            narrowband imaging.                           Localized mild inflammation characterized by                            erythema was found in the gastric antrum. Biopsies                            were taken with a cold forceps for histology.                           The examined duodenum was normal. Biopsies for                            histology were taken with a cold forceps for                            evaluation of celiac disease. Complications:            No immediate complications. Estimated Blood Loss:     Estimated blood loss: none. Impression:               - Mild Gastritis. Biopsied.                           -  Otherwise normal EGD. S/P empiric esophageal                            dilatation. Recommendation:           - Patient has a contact number available for                            emergencies. The signs and symptoms of potential                            delayed complications were discussed with the                            patient. Return to normal activities tomorrow.                            Written discharge instructions were provided to the  patient.                           - Post dilatation diet.                           - Continue Protonix 40 mg p.o. once a day.                           - Await pathology results.                           - Return to GI clinic in 12 weeks. Earlier, if                            still with problems. Discussed with patient's                            husband. Lynann Bologna, MD 09/18/2018 10:43:43 AM This report has been signed electronically.

## 2018-09-21 ENCOUNTER — Telehealth: Payer: Self-pay | Admitting: *Deleted

## 2018-09-21 ENCOUNTER — Telehealth: Payer: Self-pay

## 2018-09-21 NOTE — Telephone Encounter (Signed)
  Follow up Call-  Call back number 09/18/2018  Post procedure Call Back phone  # [phone]  Permission to leave phone message Yes  Some recent data might be hidden     Patient questions:  Do you have a fever, pain , or abdominal swelling? No. Pain Score  0 *  Have you tolerated food without any problems? Yes.    Have you been able to return to your normal activities? Yes.    Do you have any questions about your discharge instructions: Diet   No. Medications  No. Follow up visit  No.  Do you have questions or concerns about your Care? No.  Actions: * If pain score is 4 or above: No action needed, pain <4.

## 2018-09-21 NOTE — Telephone Encounter (Signed)
Left message on answering machine. 

## 2018-09-24 NOTE — Discharge Summary (Signed)
Physician Discharge Summary  Hampton Abboticole Deitrick WUX:[id number] DOB: 05/17/1976 DOA: 08/13/2018  PCP: Donato SchultzLowne Chase, Yvonne R, DO  Admit date: 08/13/2018 Discharge date: 09/24/2018   Discharge Diagnoses:  Principal Problem:   Chest pain Active Problems:   Essential hypertension   AKI (acute kidney injury) (HCC)   Elevated d-dimer   Anxiety   Hypertension   GERD (gastroesophageal reflux disease)   Hypokalemia   Acute metabolic encephalopathy   Atypical chest pain   Discharge Condition: Stable   Filed Weights   08/13/18 2228 08/14/18 0324  Weight: 85.3 kg 83.9 kg   Hospital Course: Mrs. Arlyss RepressShaffer is a 42 y.o. F with anxiety, HTN, hx left carotid pseudoaneurysm s/p cliping, hx of migraines who presented to her PCP for chest discomfort after eating, and SOB.  Found at PCP's office to have new AKI, positive d-dimer.  Acute kidney injury Baseline creatinine 0.6.  Admission creatinine 2.2, has improved to 1.2 today, still doubled baseline.  Suspect this was from poor oral intake in setting of ARB use. -Continue IV fluids - Trend creatinine -Hold losartan, HCTZ   Positive d-dimer A nonspecific finding. LE dopplers negative, CTA chest negative.  VQ negative.  Chest pain, suspect esophagitis ACS, PE, aortic dissection all ruled out.  CT actually shows some esophagitis findings, and her symptoms at least as she reports them to me, or simply globus sensation (i.e. she feels some chest discomfort and pain in her throat after eating solid food). -Obtain barium esophagram -Increase PPI dose -Transfer to med surg  Nausea, vomiting Suspect this is esophagitis from GERD?  Unable to take any food PO. -Check esophagram -Continue IV fluids  Hypertension -Hold losartan, HCTZ -Continue amlodipine, aspirin  Anxiety -Continue Abilify, clonazepam  Consultations:  None  Discharge Exam: Vitals:   08/16/18 0830 08/16/18 1154  BP:  (!) 141/98  Pulse:  72  Resp: 19 20  Temp:  98.5  F (36.9 C)  SpO2: 100% 98%    General: Not in any distress Cardiovascular: S1S2 Respiratory: Clear to auscultation  Discharge Instructions   Discharge Instructions    Diet - low sodium heart healthy   Complete by:  As directed    Increase activity slowly   Complete by:  As directed      Allergies as of 08/16/2018      Reactions   Lisinopril Cough   Metoprolol Other (See Comments)   did not feel well when taking       Medication List    STOP taking these medications   ARIPiprazole 5 MG tablet Commonly known as:  ABILIFY   promethazine 12.5 MG tablet Commonly known as:  PHENERGAN   RABEprazole 20 MG tablet Commonly known as:  ACIPHEX   traMADol 50 MG tablet Commonly known as:  ULTRAM     TAKE these medications   aspirin 81 MG EC tablet Take 1 tablet (81 mg total) by mouth daily.   Fremanezumab-vfrm 225 MG/1.5ML Sosy Commonly known as:  AJOVY Inject 225 mg into the skin every 30 (thirty) days.   hydrochlorothiazide 25 MG tablet Commonly known as:  HYDRODIURIL Take 1 tablet (25 mg total) by mouth daily.   losartan 100 MG tablet Commonly known as:  COZAAR Take 1 tablet (100 mg total) by mouth daily.   multivitamin with minerals Tabs tablet Take 1 tablet by mouth daily.   tiZANidine 4 MG tablet Commonly known as:  ZANAFLEX Take 1 tablet (4 mg total) by mouth every 6 (six) hours as needed for muscle  spasms.      Allergies  Allergen Reactions  . Lisinopril Cough  . Metoprolol Other (See Comments)    did not feel well when taking       The results of significant diagnostics from this hospitalization (including imaging, microbiology, ancillary and laboratory) are listed below for reference.    Significant Diagnostic Studies: No results found.  Microbiology: No results found for this or any previous visit (from the past 240 hour(s)).   Labs: Basic Metabolic Panel: No results for input(s): NA, K, CL, CO2, GLUCOSE, BUN, CREATININE, CALCIUM,  MG, PHOS in the last 168 hours. Liver Function Tests: No results for input(s): AST, ALT, ALKPHOS, BILITOT, PROT, ALBUMIN in the last 168 hours. No results for input(s): LIPASE, AMYLASE in the last 168 hours. No results for input(s): AMMONIA in the last 168 hours. CBC: No results for input(s): WBC, NEUTROABS, HGB, HCT, MCV, PLT in the last 168 hours. Cardiac Enzymes: No results for input(s): CKTOTAL, CKMB, CKMBINDEX, TROPONINI in the last 168 hours. BNP: BNP (last 3 results) Recent Labs    08/13/18 2303  BNP 19.1    ProBNP (last 3 results) No results for input(s): PROBNP in the last 8760 hours.  CBG: No results for input(s): GLUCAP in the last 168 hours.     Signed:  Berton MountSylvester Sandara Tyree, MD  [facility] Pager #: [phone] 7PM-7AM contact night coverage as above

## 2018-09-28 ENCOUNTER — Encounter

## 2018-09-28 ENCOUNTER — Other Ambulatory Visit: Payer: Self-pay | Admitting: Adult Health

## 2018-09-28 ENCOUNTER — Ambulatory Visit: Payer: Commercial Managed Care - PPO | Admitting: Mental Health

## 2018-09-29 ENCOUNTER — Other Ambulatory Visit: Payer: Self-pay | Admitting: Psychiatry

## 2018-10-01 ENCOUNTER — Telehealth: Payer: Self-pay | Admitting: Psychiatry

## 2018-10-01 ENCOUNTER — Other Ambulatory Visit: Payer: Self-pay

## 2018-10-01 ENCOUNTER — Other Ambulatory Visit: Payer: Self-pay | Admitting: Adult Health

## 2018-10-01 MED ORDER — VORTIOXETINE HBR 20 MG PO TABS: 20.0000 mg | ORAL_TABLET | Freq: Every day | ORAL | 1 refills | Status: DC

## 2018-10-01 MED ORDER — TRAMADOL HCL 50 MG PO TABS: ORAL_TABLET | ORAL | 0 refills | Status: DC

## 2018-10-01 NOTE — Telephone Encounter (Signed)
Patient stated she received samples of Trintellix. She is requesting a prescription sent to the [facility] on [address].Chart in box.

## 2018-10-01 NOTE — Telephone Encounter (Signed)
Confirmed pt taking 20 mg Trintellix. rx [facility].

## 2018-10-06 ENCOUNTER — Encounter: Payer: Self-pay | Admitting: Gastroenterology

## 2018-11-01 ENCOUNTER — Other Ambulatory Visit: Payer: Self-pay | Admitting: Psychiatry

## 2018-11-03 ENCOUNTER — Ambulatory Visit (INDEPENDENT_AMBULATORY_CARE_PROVIDER_SITE_OTHER): Payer: Commercial Managed Care - PPO | Admitting: Psychiatry

## 2018-11-03 ENCOUNTER — Encounter: Payer: Self-pay | Admitting: Psychiatry

## 2018-11-03 DIAGNOSIS — F411 Generalized anxiety disorder: Secondary | ICD-10-CM

## 2018-11-03 DIAGNOSIS — F331 Major depressive disorder, recurrent, moderate: Secondary | ICD-10-CM | POA: Diagnosis not present

## 2018-11-03 DIAGNOSIS — F4001 Agoraphobia with panic disorder: Secondary | ICD-10-CM

## 2018-11-03 MED ORDER — VORTIOXETINE HBR 20 MG PO TABS: 20.0000 mg | ORAL_TABLET | Freq: Every day | ORAL | 2 refills | Status: DC

## 2018-11-03 MED ORDER — CLONAZEPAM 0.5 MG PO TABS: ORAL_TABLET | ORAL | 2 refills | Status: DC

## 2018-11-03 NOTE — Progress Notes (Signed)
Jessica Molina [id number] 06-05-1976 43 y.o.  Subjective:   Patient ID:  Jessica Molina is a 43 y.o. (DOB Mar 06, 1976) female.  Chief Complaint:  Chief Complaint  Patient presents with  . Follow-up    Medication Management    HPI last seen DEC 17. presents to the office today for follow-up of depression and anxiety and change of meds.  Switched to [employer] D benefits and low SE.  Definitely less depressed and anxiety still occurs but is better.  Not sad daily.  Better motivation and interest.  Anxiety is 4/10 and at last visit 8/10.  No crying.    Return of NM for w weeks almost nightly.  Were better for a while.   She weaned off the Paxil DT urination retention.  Resolved off the Paxil.  Been off for several months.  Recent hosp for hiatal hernia and chest pain and fear of MI ruled out.  N/V unless takes Zofran.  This occurring over a month.  Pt reports that mood is Anxious and Depressed and describes anxiety as Severe. Anxiety symptoms include: Worry, no panic.Marland Kitchen Pt reports no sleep issues. Pt reports that appetite is good. Pt reports that energy is moderate and down slightly and loss of interest or pleasure in usual activities. Concentration is down slightly. Suicidal thoughts:  denied by patient.  Some fleeting death thoughts.  Weight gain DT lack of activity.  Social anxiety over the weight gain.  Less excess sleep.  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  Another D is bulimic.  Both are high maintenance.  Has to watch D 24/7.  Patient had a personal meltdown relation to her daughters behavior last night.  Patient does not feel she can control her own reactions but is not going to hurt herself nor her daughter.  She feels that this is due to the severity of her own depression and anxiety.  F in Hospice.  Past psych med trials:  Hx paxil but caused urinary retention, Zoloft without help at 89 and some wt gain, Buspar NR. 2 D's on Trintellix helped 1 of the 2  kids, Abilify.  Review of Systems:  Review of Systems  Constitutional: Positive for fatigue.  Cardiovascular: Positive for chest pain.  Gastrointestinal: Positive for nausea and vomiting.  Psychiatric/Behavioral: Positive for dysphoric mood. Negative for agitation, behavioral problems, confusion, decreased concentration, hallucinations, self-injury, sleep disturbance and suicidal ideas. The patient is nervous/anxious. The patient is not hyperactive.     Medications: I have reviewed the patient's current medications.  Current Outpatient Medications  Medication Sig Dispense Refill  . ARIPiprazole (ABILIFY) 5 MG tablet Take 5 mg by mouth daily.    . clonazePAM (KLONOPIN) 0.5 MG tablet TAKE 1 TABLET BY MOUTH TWICE A DAY AND 1 TO 2 TABLETS AT BEDTIME AS NEEDED 120 tablet 2  . doxepin (SINEQUAN) 10 MG capsule TAKE 1 TO 2 CAPSULES BY MOUTH AT BEDTIME AS NEEDED 180 capsule 0  . Fremanezumab-vfrm (AJOVY) 225 MG/1.5ML SOSY Inject 225 mg into the skin every 30 (thirty) days. 1 mL 11  . hydrochlorothiazide (HYDRODIURIL) 25 MG tablet Take 1 tablet (25 mg total) by mouth daily. 90 tablet 1  . losartan (COZAAR) 100 MG tablet Take 1 tablet (100 mg total) by mouth daily. 90 tablet 1  . Multiple Vitamin (MULTIVITAMIN WITH MINERALS) TABS tablet Take 1 tablet by mouth daily.    Marland Kitchen tiZANidine (ZANAFLEX) 4 MG tablet Take 1 tablet (4 mg total) by mouth every 6 (six)  hours as needed for muscle spasms. 30 tablet 11  . traMADol (ULTRAM) 50 MG tablet TAKE 1 TABLET BY MOUTH EVERY 6 HRS AS NEEDED. MUST LAST 30 DAYS 30 tablet 0  . vortioxetine HBr (TRINTELLIX) 20 MG TABS tablet Take 1 tablet (20 mg total) by mouth daily. 30 tablet 2  . dexlansoprazole (DEXILANT) 60 MG capsule Take 1 capsule (60 mg total) by mouth daily. (Patient not taking: Reported on 09/15/2018) 15 capsule 3  . pantoprazole (PROTONIX) 40 MG tablet Take 1 tablet (40 mg total) by mouth daily. 30 tablet 6  . promethazine (PHENERGAN) 12.5 MG tablet TAKE 1  TABLET BY MOUTH EVERY 8 HRS AS NEEDED FOR NAUSEA OR VOMITING. (Patient not taking: Reported on 09/15/2018) 12 tablet 0  . sucralfate (CARAFATE) 1 GM/10ML suspension Take 10 mLs (1 g total) by mouth 4 (four) times daily -  with meals and at bedtime. (Patient not taking: Reported on 11/03/2018) 420 mL 0   Current Facility-Administered Medications  Medication Dose Route Frequency Provider Last Rate Last Dose  . 0.9 %  sodium chloride infusion  500 mL Intravenous Once Lynann Bologna, MD        Medication Side Effects: no sex drive still without change.  No sex desire for a year or so.    Allergies:  Allergies  Allergen Reactions  . Lisinopril Cough  . Metoprolol Other (See Comments)    did not feel well when taking     Past Medical History:  Diagnosis Date  . 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 Anxiety   . GERD (gastroesophageal reflux disease)   . Hypertension   . Migraine   . PONV (postoperative nausea and vomiting)    hard to wake up after appendectomy  . Renal vein stenosis    "Kinked"    Family History  Problem Relation Age of Onset  . Breast cancer Mother        breast   . Thyroid disease Mother   . Cancer Mother 58       breast  . Hyperlipidemia Mother   . Hypertension Father   . Stroke Father   . Melanoma Father        melanoma it is terminal   . Cancer Father        melanoma  . Breast cancer Maternal Grandmother        breast   . Thyroid disease Maternal Grandmother   . Cancer Maternal Grandfather        unknown   . Cancer Paternal Grandmother        unknow   . Colon cancer Neg Hx   . Esophageal cancer Neg Hx   . Esophageal varices Neg Hx   . Stomach cancer Neg Hx     Social History   Socioeconomic History  . Marital status: Married    Spouse name: Not on file  . Number of children: 4  . Years of education: HS+  . Highest education level: Not on file  Occupational History  .  Occupation: Psychologist, sport and exercise  Social Needs  . Financial resource strain: Not on file  . Food insecurity:    Worry: Not on file    Inability: Not on file  . Transportation needs:    Medical: Not on file    Non-medical: Not on file  Tobacco Use  . Smoking status: Never Smoker  . Smokeless tobacco: Never Used  Substance and  Sexual Activity  . Alcohol use: Yes    Alcohol/week: 0.0 standard drinks    Comment: 2-3 drinks per week  . Drug use: No  . Sexual activity: Yes    Partners: Male    Birth control/protection: Surgical  Lifestyle  . Physical activity:    Days per week: Not on file    Minutes per session: Not on file  . Stress: Not on file  Relationships  . Social connections:    Talks on phone: Not on file    Gets together: Not on file    Attends religious service: Not on file    Active member of club or organization: Not on file    Attends meetings of clubs or organizations: Not on file    Relationship status: Not on file  . Intimate partner violence:    Fear of current or ex partner: Not on file    Emotionally abused: Not on file    Physically abused: Not on file    Forced sexual activity: Not on file  Other Topics Concern  . Not on file  Social History Narrative   Lives at home with husband and children.   Right-handed.   2-3 cups caffeine per week.    Past Medical History, Surgical history, Social history, and Family history were reviewed and updated as appropriate.   Please see review of systems for further details on the patient's review from today.   Objective:   Physical Exam:  LMP 06/19/2016 (Exact Date)   Physical Exam Constitutional:      General: She is not in acute distress.    Appearance: She is well-developed.  Musculoskeletal:        General: No deformity.  Neurological:     Mental Status: She is alert and oriented to person, place, and time.     Motor: No tremor.     Coordination: Coordination normal.     Gait: Gait normal.  Psychiatric:         Attention and Perception: Attention and perception normal.        Mood and Affect: Mood is anxious and depressed. Affect is not labile, blunt, angry or inappropriate.        Speech: Speech normal.        Behavior: Behavior normal.        Thought Content: Thought content normal. Thought content does not include homicidal or suicidal ideation. Thought content does not include homicidal or suicidal plan.        Cognition and Memory: Cognition normal.        Judgment: Judgment normal.     Comments: Insight intact. No auditory or visual hallucinations. No delusions.  Less depression and anxiety but not gone.     Lab Review:     Component Value Date/Time   NA 139 08/20/2018 1424   K 4.2 08/20/2018 1424   CL 102 08/20/2018 1424   CO2 24 08/20/2018 1424   GLUCOSE 78 08/20/2018 1424   BUN 11 08/20/2018 1424   CREATININE 1.09 08/20/2018 1424   CALCIUM 10.5 08/20/2018 1424   PROT 7.4 08/20/2018 1424   ALBUMIN 4.7 08/20/2018 1424   AST 41 (H) 08/20/2018 1424   ALT 68 (H) 08/20/2018 1424   ALKPHOS 65 08/20/2018 1424   BILITOT 0.4 08/20/2018 1424   GFRNONAA 55 (L) 08/18/2018 1325   GFRAA >60 08/18/2018 1325       Component Value Date/Time   WBC 7.3 08/20/2018 1424   RBC 4.14 08/20/2018 1424  HGB 12.8 08/20/2018 1424   HCT 37.9 08/20/2018 1424   PLT 487.0 (H) 08/20/2018 1424   MCV 91.5 08/20/2018 1424   MCH 30.4 08/18/2018 1325   MCHC 33.9 08/20/2018 1424   RDW 12.6 08/20/2018 1424   LYMPHSABS 2.2 08/20/2018 1424   MONOABS 0.4 08/20/2018 1424   EOSABS 0.1 08/20/2018 1424   BASOSABS 0.1 08/20/2018 1424    No results found for: POCLITH, LITHIUM   No results found for: PHENYTOIN, PHENOBARB, VALPROATE, CBMZ   .res Assessment: Plan:    Major depressive disorder, recurrent episode, moderate (HCC) - Plan: vortioxetine HBr (TRINTELLIX) 20 MG TABS tablet  Panic disorder with agoraphobia - Plan: clonazePAM (KLONOPIN) 0.5 MG tablet  Generalized anxiety disorder   Emphasize  the need to keep appointments and especially when she is not doing well.  She had benefit from the Paxil but had side effects affecting urination and took herself off of it.  Continue Trial trintellix  20 mg daily.  Clearly better with it.    Consider DC abilify later.  Discussed potential metabolic side effects associated with atypical antipsychotics, as well as potential risk for movement side effects. Advised pt to contact office if movement side effects occur.   We discussed the short-term risks associated with benzodiazepines including sedation and increased fall risk among others.  Discussed long-term side effect risk including dependence, potential withdrawal symptoms, and the potential eventual dose-related risk of dementia.  Agree with counselor.  Likes new therapist.  FU  12 weeks.  Meredith Staggers, MD, DFAPA   Please see After Visit Summary for patient specific instructions.  No future appointments.  No orders of the defined types were placed in this encounter.     -------------------------------

## 2018-11-17 ENCOUNTER — Other Ambulatory Visit: Payer: Self-pay | Admitting: Adult Health

## 2018-11-17 NOTE — Telephone Encounter (Signed)
Dr. Terrace Arabia, Last refill on 10/01/2018 and is requesting refill at this time.  When she was evaluated in 07/2017, she stated she intermittently was taking tramadol and would only use for breakthrough migraines.  Are you okay with her using tramadol this frequently for migraine management or which you like her to come back in to be evaluated by Maralyn Sago?  Thank you, Shanda Bumps

## 2018-11-18 NOTE — Telephone Encounter (Signed)
Dr. Terrace Arabia prescribed tramadol for patient.

## 2018-12-22 ENCOUNTER — Other Ambulatory Visit: Payer: Self-pay | Admitting: Neurology

## 2018-12-23 DIAGNOSIS — W1839XA Other fall on same level, initial encounter: Secondary | ICD-10-CM | POA: Diagnosis not present

## 2018-12-23 DIAGNOSIS — J9811 Atelectasis: Secondary | ICD-10-CM | POA: Diagnosis not present

## 2018-12-23 DIAGNOSIS — R410 Disorientation, unspecified: Secondary | ICD-10-CM | POA: Diagnosis not present

## 2018-12-23 DIAGNOSIS — I10 Essential (primary) hypertension: Secondary | ICD-10-CM | POA: Diagnosis not present

## 2018-12-23 DIAGNOSIS — T402X1A Poisoning by other opioids, accidental (unintentional), initial encounter: Secondary | ICD-10-CM | POA: Diagnosis not present

## 2018-12-23 DIAGNOSIS — S0232XA Fracture of orbital floor, left side, initial encounter for closed fracture: Secondary | ICD-10-CM | POA: Diagnosis not present

## 2018-12-23 DIAGNOSIS — I517 Cardiomegaly: Secondary | ICD-10-CM | POA: Diagnosis not present

## 2018-12-26 DIAGNOSIS — I10 Essential (primary) hypertension: Secondary | ICD-10-CM | POA: Diagnosis not present

## 2018-12-26 DIAGNOSIS — I517 Cardiomegaly: Secondary | ICD-10-CM | POA: Diagnosis not present

## 2018-12-26 DIAGNOSIS — S0232XA Fracture of orbital floor, left side, initial encounter for closed fracture: Secondary | ICD-10-CM | POA: Diagnosis not present

## 2018-12-27 ENCOUNTER — Other Ambulatory Visit: Payer: Self-pay | Admitting: Psychiatry

## 2018-12-27 DIAGNOSIS — F331 Major depressive disorder, recurrent, moderate: Secondary | ICD-10-CM

## 2018-12-29 ENCOUNTER — Other Ambulatory Visit: Payer: Self-pay | Admitting: Psychiatry

## 2019-01-25 ENCOUNTER — Other Ambulatory Visit: Payer: Self-pay | Admitting: *Deleted

## 2019-01-25 MED ORDER — LOSARTAN POTASSIUM 100 MG PO TABS: 100.0000 mg | ORAL_TABLET | Freq: Every day | ORAL | 1 refills | Status: DC

## 2019-01-25 MED ORDER — HYDROCHLOROTHIAZIDE 25 MG PO TABS: 25.0000 mg | ORAL_TABLET | Freq: Every day | ORAL | 1 refills | Status: DC

## 2019-01-26 ENCOUNTER — Encounter: Payer: Self-pay | Admitting: Family Medicine

## 2019-01-26 ENCOUNTER — Other Ambulatory Visit: Payer: Self-pay

## 2019-01-26 ENCOUNTER — Ambulatory Visit (INDEPENDENT_AMBULATORY_CARE_PROVIDER_SITE_OTHER): Payer: Commercial Managed Care - PPO | Admitting: Family Medicine

## 2019-01-26 DIAGNOSIS — E785 Hyperlipidemia, unspecified: Secondary | ICD-10-CM | POA: Diagnosis not present

## 2019-01-26 DIAGNOSIS — I10 Essential (primary) hypertension: Secondary | ICD-10-CM | POA: Diagnosis not present

## 2019-01-26 DIAGNOSIS — R600 Localized edema: Secondary | ICD-10-CM

## 2019-01-26 DIAGNOSIS — F419 Anxiety disorder, unspecified: Secondary | ICD-10-CM

## 2019-01-26 NOTE — Progress Notes (Signed)
Virtual Visit via Video Note  I connected with Jessica Molina on 01/26/19 at 11:00 AM EDT by a video enabled telemedicine application and verified that I am speaking with the correct person using two identifiers.   I discussed the limitations of evaluation and management by telemedicine and the availability of in person appointments. The patient expressed understanding and agreed to proceed.  History of Present Illness: Pt is home with no complaints.  Her father just passed away from melanoma.  She states she is doing ok emotionally and sees her psych regularly.      Provider is in office  Past Medical History:  Diagnosis Date  . 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 Anxiety   . GERD (gastroesophageal reflux disease)   . Hypertension   . Migraine   . PONV (postoperative nausea and vomiting)    hard to wake up after appendectomy  . Renal vein stenosis    "Kinked"   Outpatient Encounter Medications as of 01/26/2019  Medication Sig Note  . ARIPiprazole (ABILIFY) 5 MG tablet Take 5 mg by mouth daily.   . clonazePAM (KLONOPIN) 0.5 MG tablet TAKE 1 TABLET BY MOUTH TWICE A DAY AND 1 TO 2 TABLETS AT BEDTIME AS NEEDED   . dexlansoprazole (DEXILANT) 60 MG capsule Take 1 capsule (60 mg total) by mouth daily. (Patient not taking: Reported on 09/15/2018)   . doxepin (SINEQUAN) 10 MG capsule TAKE 1 TO 2 CAPSULES BY MOUTH AT BEDTIME AS NEEDED   . Fremanezumab-vfrm (AJOVY) 225 MG/1.5ML SOSY Inject 225 mg into the skin every 30 (thirty) days. 09/08/2018: PA approved effective 09/08/2018 - 09/09/2019. Faxed notice of approval to [facility] at [phone]. Received fax confirmation.    . hydrochlorothiazide (HYDRODIURIL) 25 MG tablet Take 1 tablet (25 mg total) by mouth daily.   Marland Kitchen. losartan (COZAAR) 100 MG tablet Take 1 tablet (100 mg total) by mouth daily.   . Multiple Vitamin (MULTIVITAMIN WITH MINERALS) TABS tablet Take 1 tablet by mouth  daily.   . pantoprazole (PROTONIX) 40 MG tablet Take 1 tablet (40 mg total) by mouth daily.   . promethazine (PHENERGAN) 12.5 MG tablet TAKE 1 TABLET BY MOUTH EVERY 8 HRS AS NEEDED FOR NAUSEA OR VOMITING. (Patient not taking: Reported on 09/15/2018)   . sucralfate (CARAFATE) 1 GM/10ML suspension Take 10 mLs (1 g total) by mouth 4 (four) times daily -  with meals and at bedtime. (Patient not taking: Reported on 11/03/2018)   . tiZANidine (ZANAFLEX) 4 MG tablet Take 1 tablet (4 mg total) by mouth every 6 (six) hours as needed for muscle spasms.   . traMADol (ULTRAM) 50 MG tablet TAKE 1 TABLET BY MOUTH EVERY 6 HRS AS NEEDED. MUST LAST 30 DAYS   . TRINTELLIX 20 MG TABS tablet TAKE 1 TABLET BY MOUTH EVERY DAY    Facility-Administered Encounter Medications as of 01/26/2019  Medication  . 0.9 %  sodium chloride infusion   Observations/Objective: 120/60  Afebrile  Pt in NAD rr normal    Assessment and Plan: 1. Hyperlipidemia, unspecified hyperlipidemia type Tolerating statin, encouraged heart healthy diet, avoid trans fats, minimize simple carbs and saturated fats. Increase exercise as tolerated - Comprehensive metabolic panel; Future - Lipid panel; Future  2. Essential hypertension Well controlled, no changes to meds. Encouraged heart healthy diet such as the DASH diet and exercise as tolerated.    3. Edema, lower extremity Stable,  Refill meds  4. Anxiety  Stable---  con't psych   Follow Up Instructions:    I discussed the assessment and treatment plan with the patient. The patient was provided an opportunity to ask questions and all were answered. The patient agreed with the plan and demonstrated an understanding of the instructions.   The patient was advised to call back or seek an in-person evaluation if the symptoms worsen or if the condition fails to improve as anticipated.  I  Donato Schultz, DO

## 2019-02-02 ENCOUNTER — Other Ambulatory Visit: Payer: Self-pay

## 2019-02-02 ENCOUNTER — Encounter: Payer: Self-pay | Admitting: Psychiatry

## 2019-02-02 ENCOUNTER — Ambulatory Visit (INDEPENDENT_AMBULATORY_CARE_PROVIDER_SITE_OTHER): Payer: Commercial Managed Care - PPO | Admitting: Psychiatry

## 2019-02-02 DIAGNOSIS — F331 Major depressive disorder, recurrent, moderate: Secondary | ICD-10-CM

## 2019-02-02 DIAGNOSIS — F5105 Insomnia due to other mental disorder: Secondary | ICD-10-CM

## 2019-02-02 DIAGNOSIS — F411 Generalized anxiety disorder: Secondary | ICD-10-CM | POA: Diagnosis not present

## 2019-02-02 DIAGNOSIS — F4001 Agoraphobia with panic disorder: Secondary | ICD-10-CM | POA: Diagnosis not present

## 2019-02-02 MED ORDER — MIRTAZAPINE 15 MG PO TABS: 15.0000 mg | ORAL_TABLET | Freq: Every day | ORAL | 2 refills | Status: DC

## 2019-02-02 NOTE — Progress Notes (Signed)
[name] [id number] 19-Jun-1976 43 y.o.  Subjective:   Patient ID:  Jessica Molina is a 43 y.o. (DOB [birth year]) female.  Virtual Visit via Telephone Note  I connected with pt by telephone and verified that I am speaking with the correct person using two identifiers.   I discussed the limitations, risks, security and privacy concerns of performing an evaluation and management service by telephone and the availability of in person appointments. I also discussed with the patient that there may be a patient responsible charge related to this service. The patient expressed understanding and agreed to proceed.  I discussed the assessment and treatment plan with the patient. The patient was provided an opportunity to ask questions and all were answered. The patient agreed with the plan and demonstrated an understanding of the instructions.   The patient was advised to call back or seek an in-person evaluation if the symptoms worsen or if the condition fails to improve as anticipated.  I provided 15 minutes of non-face-to-face time during this encounter. The call started at 1015 and ended at 1030. The patient was located at home and the provider was located at office.  Chief Complaint:  Chief Complaint  Patient presents with  . Follow-up    Medication Management  . Anxiety    Medication Management  . Depression    Father passed    Anxiety  Symptoms include nervous/anxious behavior. Patient reports no chest pain, confusion, decreased concentration, nausea or suicidal ideas.    Depression         Associated symptoms include no decreased concentration, no fatigue and no suicidal ideas.  Past medical history includes anxiety.    presents to the office today for follow-up of depression and anxiety and change of meds.  Last visit Nov 03, 2018.  We had just changed her to Trintellix 20 mg and she seemed better with that so no meds were changed.  Father passed away from cancer in  Hospice.  Sad.  A couple weeks ago.  Trintellix helps a lot.  More active and motivated.  Interested.  A lot of restless sleep and anxiety can worsen it.  More anxiety if goes out I public and not doing that.  Sleep 4-6 hours.  Normal is 7-8 hours.  Will awaken startled but doesn't remember dreams.  Trying to exercise more.  Brief depressed periods ,better not gone.  Switched to [employer] D benefits and low SE.  Definitely less depressed and anxiety still occurs but is better.  Not sad daily.  Better motivation and interest.  Anxiety is 4/10 and at last visit 8/10.  No crying.    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  Another D is bulimic.  Both are high maintenance.  Has to watch D 24/7.  Patient had a personal meltdown relation to her daughters behavior last night.  Patient does not feel she can control her own reactions but is not going to hurt herself nor her daughter.  She feels that this is due to the severity of her own depression and anxiety.  Past psych med trials:  Hx paxil but caused urinary retention, Zoloft without help at 89 and some wt gain, Buspar NR. 2 D's on Trintellix helped 1 of the 2 kids, Abilify.  Review of Systems:  Review of Systems  Constitutional: Negative for fatigue.  Cardiovascular: Negative for chest pain.  Gastrointestinal: Negative for nausea and vomiting.  Neurological: Positive for weakness. Negative for tremors.  Psychiatric/Behavioral: Positive  for depression and dysphoric mood. Negative for agitation, behavioral problems, confusion, decreased concentration, hallucinations, self-injury, sleep disturbance and suicidal ideas. The patient is nervous/anxious. The patient is not hyperactive.     Medications: I have reviewed the patient's current medications.  Current Outpatient Medications  Medication Sig Dispense Refill  . ARIPiprazole (ABILIFY) 5 MG tablet Take 5 mg by mouth daily.    . clonazePAM (KLONOPIN) 0.5 MG tablet TAKE 1  TABLET BY MOUTH TWICE A DAY AND 1 TO 2 TABLETS AT BEDTIME AS NEEDED 120 tablet 2  . doxepin (SINEQUAN) 10 MG capsule TAKE 1 TO 2 CAPSULES BY MOUTH AT BEDTIME AS NEEDED 180 capsule 0  . Fremanezumab-vfrm (AJOVY) 225 MG/1.5ML SOSY Inject 225 mg into the skin every 30 (thirty) days. 1 mL 11  . hydrochlorothiazide (HYDRODIURIL) 25 MG tablet Take 1 tablet (25 mg total) by mouth daily. 90 tablet 1  . losartan (COZAAR) 100 MG tablet Take 1 tablet (100 mg total) by mouth daily. 90 tablet 1  . Multiple Vitamin (MULTIVITAMIN WITH MINERALS) TABS tablet Take 1 tablet by mouth daily.    Marland Kitchen tiZANidine (ZANAFLEX) 4 MG tablet Take 1 tablet (4 mg total) by mouth every 6 (six) hours as needed for muscle spasms. 30 tablet 11  . traMADol (ULTRAM) 50 MG tablet TAKE 1 TABLET BY MOUTH EVERY 6 HRS AS NEEDED. MUST LAST 30 DAYS 30 tablet 0  . TRINTELLIX 20 MG TABS tablet TAKE 1 TABLET BY MOUTH EVERY DAY 90 tablet 1  . mirtazapine (REMERON) 15 MG tablet Take 1 tablet (15 mg total) by mouth at bedtime. 30 tablet 2   Current Facility-Administered Medications  Medication Dose Route Frequency Provider Last Rate Last Dose  . 0.9 %  sodium chloride infusion  500 mL Intravenous Once Lynann Bologna, MD        Medication Side Effects: no sex drive still without change.  No sex desire for a year or so.    Allergies:  Allergies  Allergen Reactions  . Acetaminophen Itching  . Lisinopril Cough  . Metoprolol Other (See Comments)    did not feel well when taking     Past Medical History:  Diagnosis Date  . 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 Anxiety   . GERD (gastroesophageal reflux disease)   . Hypertension   . Migraine   . PONV (postoperative nausea and vomiting)    hard to wake up after appendectomy  . Renal vein stenosis    "Kinked"    Family History  Problem Relation Age of Onset  . Breast cancer Mother        breast   . Thyroid disease Mother    . Cancer Mother 62       breast  . Hyperlipidemia Mother   . Hypertension Father   . Stroke Father   . Melanoma Father        melanoma it is terminal   . Cancer Father        melanoma  . Breast cancer Maternal Grandmother        breast   . Thyroid disease Maternal Grandmother   . Cancer Maternal Grandfather        unknown   . Cancer Paternal Grandmother        unknow   . Colon cancer Neg Hx   . Esophageal cancer Neg Hx   . Esophageal varices Neg Hx   . Stomach cancer Neg  Hx     Social History   Socioeconomic History  . Marital status: Married    Spouse name: Not on file  . Number of children: 4  . Years of education: HS+  . Highest education level: Not on file  Occupational History  . Occupation: Psychologist, sport and exercise  Social Needs  . Financial resource strain: Not on file  . Food insecurity:    Worry: Not on file    Inability: Not on file  . Transportation needs:    Medical: Not on file    Non-medical: Not on file  Tobacco Use  . Smoking status: Never Smoker  . Smokeless tobacco: Never Used  Substance and Sexual Activity  . Alcohol use: Yes    Alcohol/week: 0.0 standard drinks    Comment: 2-3 drinks per week  . Drug use: No  . Sexual activity: Yes    Partners: Male    Birth control/protection: Surgical  Lifestyle  . Physical activity:    Days per week: Not on file    Minutes per session: Not on file  . Stress: Not on file  Relationships  . Social connections:    Talks on phone: Not on file    Gets together: Not on file    Attends religious service: Not on file    Active member of club or organization: Not on file    Attends meetings of clubs or organizations: Not on file    Relationship status: Not on file  . Intimate partner violence:    Fear of current or ex partner: Not on file    Emotionally abused: Not on file    Physically abused: Not on file    Forced sexual activity: Not on file  Other Topics Concern  . Not on file  Social History Narrative    Lives at home with husband and children.   Right-handed.   2-3 cups caffeine per week.    Past Medical History, Surgical history, Social history, and Family history were reviewed and updated as appropriate.   Please see review of systems for further details on the patient's review from today.   Objective:   Physical Exam:  LMP 06/19/2016 (Exact Date)   Physical Exam Constitutional:      General: She is not in acute distress.    Appearance: She is well-developed.  Musculoskeletal:        General: No deformity.  Neurological:     Mental Status: She is alert and oriented to person, place, and time.     Motor: No tremor.     Coordination: Coordination normal.     Gait: Gait normal.  Psychiatric:        Attention and Perception: Attention and perception normal.        Mood and Affect: Mood is anxious and depressed. Affect is not labile, blunt, angry or inappropriate.        Speech: Speech normal.        Behavior: Behavior normal.        Thought Content: Thought content normal. Thought content does not include homicidal or suicidal ideation. Thought content does not include homicidal or suicidal plan.        Cognition and Memory: Cognition normal.        Judgment: Judgment normal.     Comments: Insight intact. No auditory or visual hallucinations. No delusions.  Less depression and anxiety but not gone.     Lab Review:     Component Value Date/Time   NA  139 08/20/2018 1424   K 4.2 08/20/2018 1424   CL 102 08/20/2018 1424   CO2 24 08/20/2018 1424   GLUCOSE 78 08/20/2018 1424   BUN 11 08/20/2018 1424   CREATININE 1.09 08/20/2018 1424   CALCIUM 10.5 08/20/2018 1424   PROT 7.4 08/20/2018 1424   ALBUMIN 4.7 08/20/2018 1424   AST 41 (H) 08/20/2018 1424   ALT 68 (H) 08/20/2018 1424   ALKPHOS 65 08/20/2018 1424   BILITOT 0.4 08/20/2018 1424   GFRNONAA 55 (L) 08/18/2018 1325   GFRAA >60 08/18/2018 1325       Component Value Date/Time   WBC 7.3 08/20/2018 1424   RBC  4.14 08/20/2018 1424   HGB 12.8 08/20/2018 1424   HCT 37.9 08/20/2018 1424   PLT 487.0 (H) 08/20/2018 1424   MCV 91.5 08/20/2018 1424   MCH 30.4 08/18/2018 1325   MCHC 33.9 08/20/2018 1424   RDW 12.6 08/20/2018 1424   LYMPHSABS 2.2 08/20/2018 1424   MONOABS 0.4 08/20/2018 1424   EOSABS 0.1 08/20/2018 1424   BASOSABS 0.1 08/20/2018 1424    No results found for: POCLITH, LITHIUM   No results found for: PHENYTOIN, PHENOBARB, VALPROATE, CBMZ   .res Assessment: Plan:    Major depressive disorder, recurrent episode, moderate (HCC)  Panic disorder with agoraphobia  Generalized anxiety disorder  Insomnia due to mental condition   Emphasize the need to keep appointments and especially when she is not doing well.  She had benefit from the Paxil but had side effects affecting urination and took herself off of it.  Continue Trial trintellix  20 mg daily.  Clearly better with it.    Consider DC abilify later. Now is not good time bc F died. iscussed potential metabolic side effects associated with atypical antipsychotics, as well as potential risk for movement side effects. Advised pt to contact office if movement side effects occur.   We discussed the short-term risks associated with benzodiazepines including sedation and increased fall risk among others.  Discussed long-term side effect risk including dependence, potential withdrawal symptoms, and the potential eventual dose-related risk of dementia.  Agree with counselor.  Likes new therapist.  Trial mirtazapine 15 1/2-1 tablet. In place of doxepin.  Disc SE.  FU 2 mos  Meredith Staggersarey Cottle, MD, DFAPA   Please see After Visit Summary for patient specific instructions.  Future Appointments  Date Time Provider Department Center  02/25/2019  8:15 AM [facility]  07/29/2019  8:15 AM Lowne Florina Ouhase, Yvonne R, DO [facility]    No orders of the defined types were placed in this encounter.      -------------------------------

## 2019-02-10 ENCOUNTER — Other Ambulatory Visit: Payer: Self-pay | Admitting: Adult Health

## 2019-02-14 ENCOUNTER — Other Ambulatory Visit: Payer: Self-pay | Admitting: Psychiatry

## 2019-02-15 ENCOUNTER — Other Ambulatory Visit: Payer: Self-pay | Admitting: Adult Health

## 2019-02-15 MED ORDER — TRAMADOL HCL 50 MG PO TABS: 50.0000 mg | ORAL_TABLET | Freq: Two times a day (BID) | ORAL | 5 refills | Status: DC | PRN

## 2019-02-15 NOTE — Addendum Note (Signed)
Addended by: Levert Feinstein on: 02/15/2019 01:55 PM   Modules accepted: Orders

## 2019-02-15 NOTE — Telephone Encounter (Addendum)
I checked [place] Narcotic Registry  She is taking tramadol 50mg  30 tablets from our office every month.   I refill her tramadol 50mg  30 tabs with 5 refills.

## 2019-02-15 NOTE — Telephone Encounter (Signed)
Rx sent to Dr. Terrace Arabia for review and refill.

## 2019-02-15 NOTE — Telephone Encounter (Signed)
Patient called stating that her pharmacy has not heard from [place] about refilling her tramadol.

## 2019-02-25 ENCOUNTER — Encounter: Payer: Self-pay | Admitting: Gynecology

## 2019-02-25 ENCOUNTER — Ambulatory Visit (INDEPENDENT_AMBULATORY_CARE_PROVIDER_SITE_OTHER): Payer: Commercial Managed Care - PPO | Admitting: Gynecology

## 2019-02-25 ENCOUNTER — Other Ambulatory Visit: Payer: Self-pay | Admitting: Psychiatry

## 2019-02-25 ENCOUNTER — Other Ambulatory Visit: Payer: Self-pay

## 2019-02-25 ENCOUNTER — Other Ambulatory Visit: Payer: Commercial Managed Care - PPO

## 2019-02-25 VITALS — BP 124/80

## 2019-02-25 DIAGNOSIS — N644 Mastodynia: Secondary | ICD-10-CM | POA: Diagnosis not present

## 2019-02-25 NOTE — Patient Instructions (Signed)
The breast center should call to schedule the mammogram and ultrasound.  Call our office if you do not hear from them within the next week or so.

## 2019-02-25 NOTE — Progress Notes (Signed)
    Jessica Molina 1975-12-27 [id number]        43 y.o.  [name] presents with right breast tenderness primarily in the outer quadrant since January.  No clear definitive masses on self breast exam but noticed some nodularity in the outer quadrant.  No nipple discharge.  Mammogram 07/2018 normal.  Family history of breast cancer in mother and maternal grandmother.  The patient underwent genetic panel screening and was negative.  Past medical history,surgical history, problem list, medications, allergies, family history and social history were all reviewed and documented in the EPIC chart.  Directed ROS with pertinent positives and negatives documented in the history of present illness/assessment and plan.  Exam: Jessica Molina assistant Vitals:   02/25/19 1157  BP: 124/80   General appearance:  Normal Both breasts examined lying and sitting without masses, retractions, discharge, adenopathy.  Assessment/Plan:  43 y.o. [name] with persistent right breast tenderness.  No abnormalities on exam.  Recommended diagnostic mammography and ultrasound.  Patient will schedule through the breast center and follow-up for that.  She knows to call our office if she does not hear to schedule the appointment within a week.    Jessica Lords MD, 12:12 PM 02/25/2019

## 2019-02-28 ENCOUNTER — Other Ambulatory Visit: Payer: Self-pay | Admitting: Psychiatry

## 2019-02-28 DIAGNOSIS — F4001 Agoraphobia with panic disorder: Secondary | ICD-10-CM

## 2019-03-02 ENCOUNTER — Other Ambulatory Visit: Payer: Self-pay

## 2019-03-03 ENCOUNTER — Encounter: Payer: Self-pay | Admitting: Women's Health

## 2019-03-03 ENCOUNTER — Ambulatory Visit (INDEPENDENT_AMBULATORY_CARE_PROVIDER_SITE_OTHER): Payer: Commercial Managed Care - PPO | Admitting: Women's Health

## 2019-03-03 VITALS — BP 120/82 | Ht 63.0 in | Wt 184.0 lb

## 2019-03-03 DIAGNOSIS — Z01419 Encounter for gynecological examination (general) (routine) without abnormal findings: Secondary | ICD-10-CM

## 2019-03-03 NOTE — Progress Notes (Addendum)
Jessica Molina 08/16/1976 [id number]    History:    Presents for annual exam.  2017 TVH with bilateral salpingectomies for menorrhagia.  2004 LEEP with normal Paps after.  Mother breast cancer at age 44 returned at 70 and died from metastatic cancer.  Father melanoma.  Normal mammogram history.  Primary care manages hypertension, has had migraines without our Botox, anxiety/depression.  Past medical history, past surgical history, family history and social history were all reviewed and documented in the EPIC chart.  Pet sitting/walking business.  4 daughters, oldest daughter 36 recently graduated from high school, [employer], 43 year old struggling with anorexia has just completed a 1 year treatment program, has 67 year old twin daughters doing well.  From the [place].  3 of her carotid artery aneurysm/time section.  Father melanoma.  ROS:  A ROS was performed and pertinent positives and negatives are included.  Exam:  Vitals:   03/03/19 1359  BP: 120/82  Weight: 184 lb (83.5 kg)  Height: 5\' 3"  (1.6 m)   Body mass index is 32.59 kg/m.   General appearance:  Normal Thyroid:  Symmetrical, normal in size, without palpable masses or nodularity. Respiratory  Auscultation:  Clear without wheezing or rhonchi Cardiovascular  Auscultation:  Regular rate, without rubs, murmurs or gallops  Edema/varicosities:  Not grossly evident Abdominal  Soft,nontender, without masses, guarding or rebound.  Liver/spleen:  No organomegaly noted  Hernia:  None appreciated  Skin  Inspection:  Grossly normal   Breasts: Examined lying and sitting.     Right: Without masses, retractions, discharge or axillary adenopathy.  Mild tenderness outer quadrant     Left: Without masses, retractions, discharge or axillary adenopathy. Gentitourinary   Inguinal/mons:  Normal without inguinal adenopathy  External genitalia:  Normal  BUS/Urethra/Skene's glands:  Normal  Vagina:  Normal  Cervix: And uterus  absent  Adnexa/parametria:     Rt: Without masses or tenderness.   Lt: Without masses or tenderness.  Anus and perineum: Normal  Digital rectal exam: Normal sphincter tone without palpated masses or tenderness  Assessment/Plan:  43 y.o. MWF G4, P4 for annual exam with no GYN complaints.  2017 TVH with bilateral salpingectomies for menorrhagia 2004 LEEP normal Paps after Hypertension, migraines, GERD, anxiety/depression-primary care manages labs and meds Obesity  Plan: Continue counseling as needed, daughter returning home from 1 year inpatient treatment for anorexia.  SBEs, continue annual 3D screening mammogram, calcium rich foods, was seen in the office last week for right sided breast tenderness and was scheduled diagnostic mammogram with ultrasound will get scheduled.  Reviewed importance of increasing exercise and decreasing calories has gained approximately 40 pounds over the past 2 years.  Vitamin D 1000 daily encouraged.  Leisure activities, self-care encouraged.  Father died from melanoma, encouraged annual skin check with dermatologist.   Harrington Challenger [facility], 2:04 PM 03/03/2019

## 2019-03-03 NOTE — Patient Instructions (Addendum)
Health Maintenance, Female Adopting a healthy lifestyle and getting preventive care can go a long way to promote health and wellness. Talk with your health care provider about what schedule of regular examinations is right for you. This is a good chance for you to check in with your provider about disease prevention and staying healthy. In between checkups, there are plenty of things you can do on your own. Experts have done a lot of research about which lifestyle changes and preventive measures are most likely to keep you healthy. Ask your health care provider for more information. Weight and diet Eat a healthy diet  Be sure to include plenty of vegetables, fruits, low-fat dairy products, and lean protein.  Do not eat a lot of foods high in solid fats, added sugars, or salt.  Get regular exercise. This is one of the most important things you can do for your health. ? Most adults should exercise for at least 150 minutes each week. The exercise should increase your heart rate and make you sweat (moderate-intensity exercise). ? Most adults should also do strengthening exercises at least twice a week. This is in addition to the moderate-intensity exercise. Maintain a healthy weight  Body mass index (BMI) is a measurement that can be used to identify possible weight problems. It estimates body fat based on height and weight. Your health care provider can help determine your BMI and help you achieve or maintain a healthy weight.  For females 20 years of age and older: ? A BMI below 18.5 is considered underweight. ? A BMI of 18.5 to 24.9 is normal. ? A BMI of 25 to 29.9 is considered overweight. ? A BMI of 30 and above is considered obese. Watch levels of cholesterol and blood lipids  You should start having your blood tested for lipids and cholesterol at 44 years of age, then have this test every 5 years.  You may need to have your cholesterol levels checked more often if: ? Your lipid or  cholesterol levels are high. ? You are older than 44 years of age. ? You are at high risk for heart disease. Cancer screening Lung Cancer  Lung cancer screening is recommended for adults 55-80 years old who are at high risk for lung cancer because of a history of smoking.  A yearly low-dose CT scan of the lungs is recommended for people who: ? Currently smoke. ? Have quit within the past 15 years. ? Have at least a 30-pack-year history of smoking. A pack year is smoking an average of one pack of cigarettes a day for 1 year.  Yearly screening should continue until it has been 15 years since you quit.  Yearly screening should stop if you develop a health problem that would prevent you from having lung cancer treatment. Breast Cancer  Practice breast self-awareness. This means understanding how your breasts normally appear and feel.  It also means doing regular breast self-exams. Let your health care provider know about any changes, no matter how small.  If you are in your 20s or 30s, you should have a clinical breast exam (CBE) by a health care provider every 1-3 years as part of a regular health exam.  If you are 40 or older, have a CBE every year. Also consider having a breast X-ray (mammogram) every year.  If you have a family history of breast cancer, talk to your health care provider about genetic screening.  If you are at high risk for breast cancer, talk   to your health care provider about having an MRI and a mammogram every year.  Breast cancer gene (BRCA) assessment is recommended for women who have family members with BRCA-related cancers. BRCA-related cancers include: ? Breast. ? Ovarian. ? Tubal. ? Peritoneal cancers.  Results of the assessment will determine the need for genetic counseling and BRCA1 and BRCA2 testing. Cervical Cancer Your health care provider may recommend that you be screened regularly for cancer of the pelvic organs (ovaries, uterus, and vagina).  This screening involves a pelvic examination, including checking for microscopic changes to the surface of your cervix (Pap test). You may be encouraged to have this screening done every 3 years, beginning at age 21.  For women ages 30-65, health care providers may recommend pelvic exams and Pap testing every 3 years, or they may recommend the Pap and pelvic exam, combined with testing for human papilloma virus (HPV), every 5 years. Some types of HPV increase your risk of cervical cancer. Testing for HPV may also be done on women of any age with unclear Pap test results.  Other health care providers may not recommend any screening for nonpregnant women who are considered low risk for pelvic cancer and who do not have symptoms. Ask your health care provider if a screening pelvic exam is right for you.  If you have had past treatment for cervical cancer or a condition that could lead to cancer, you need Pap tests and screening for cancer for at least 20 years after your treatment. If Pap tests have been discontinued, your risk factors (such as having a new sexual partner) need to be reassessed to determine if screening should resume. Some women have medical problems that increase the chance of getting cervical cancer. In these cases, your health care provider may recommend more frequent screening and Pap tests. Colorectal Cancer  This type of cancer can be detected and often prevented.  Routine colorectal cancer screening usually begins at 44 years of age and continues through 44 years of age.  Your health care provider may recommend screening at an earlier age if you have risk factors for colon cancer.  Your health care provider may also recommend using home test kits to check for hidden blood in the stool.  A small camera at the end of a tube can be used to examine your colon directly (sigmoidoscopy or colonoscopy). This is done to check for the earliest forms of colorectal cancer.  Routine  screening usually begins at age 50.  Direct examination of the colon should be repeated every 5-10 years through 44 years of age. However, you may need to be screened more often if early forms of precancerous polyps or small growths are found. Skin Cancer  Check your skin from head to toe regularly.  Tell your health care provider about any new moles or changes in moles, especially if there is a change in a mole's shape or color.  Also tell your health care provider if you have a mole that is larger than the size of a pencil eraser.  Always use sunscreen. Apply sunscreen liberally and repeatedly throughout the day.  Protect yourself by wearing long sleeves, pants, a wide-brimmed hat, and sunglasses whenever you are outside. Heart disease, diabetes, and high blood pressure  High blood pressure causes heart disease and increases the risk of stroke. High blood pressure is more likely to develop in: ? People who have blood pressure in the high end of the normal range (130-139/85-89 mm Hg). ? People   who are overweight or obese. ? People who are African American.  If you are 84-22 years of age, have your blood pressure checked every 3-5 years. If you are 67 years of age or older, have your blood pressure checked every year. You should have your blood pressure measured twice-once when you are at a hospital or clinic, and once when you are not at a hospital or clinic. Record the average of the two measurements. To check your blood pressure when you are not at a hospital or clinic, you can use: ? An automated blood pressure machine at a pharmacy. ? A home blood pressure monitor.  If you are between 52 years and 3 years old, ask your health care provider if you should take aspirin to prevent strokes.  Have regular diabetes screenings. This involves taking a blood sample to check your fasting blood sugar level. ? If you are at a normal weight and have a low risk for diabetes, have this test once  every three years after 44 years of age. ? If you are overweight and have a high risk for diabetes, consider being tested at a younger age or more often. Preventing infection Hepatitis B  If you have a higher risk for hepatitis B, you should be screened for this virus. You are considered at high risk for hepatitis B if: ? You were born in a country where hepatitis B is common. Ask your health care provider which countries are considered high risk. ? Your parents were born in a high-risk country, and you have not been immunized against hepatitis B (hepatitis B vaccine). ? You have HIV or AIDS. ? You use needles to inject street drugs. ? You live with someone who has hepatitis B. ? You have had sex with someone who has hepatitis B. ? You get hemodialysis treatment. ? You take certain medicines for conditions, including cancer, organ transplantation, and autoimmune conditions. Hepatitis C  Blood testing is recommended for: ? Everyone born from [birth year] through 1966. ? Anyone with known risk factors for hepatitis C. Sexually transmitted infections (STIs)  You should be screened for sexually transmitted infections (STIs) including gonorrhea and chlamydia if: ? You are sexually active and are younger than 44 years of age. ? You are older than 44 years of age and your health care provider tells you that you are at risk for this type of infection. ? Your sexual activity has changed since you were last screened and you are at an increased risk for chlamydia or gonorrhea. Ask your health care provider if you are at risk.  If you do not have HIV, but are at risk, it may be recommended that you take a prescription medicine daily to prevent HIV infection. This is called pre-exposure prophylaxis (PrEP). You are considered at risk if: ? You are sexually active and do not regularly use condoms or know the HIV status of your partner(s). ? You take drugs by injection. ? You are sexually active with a partner  who has HIV. Talk with your health care provider about whether you are at high risk of being infected with HIV. If you choose to begin PrEP, you should first be tested for HIV. You should then be tested every 3 months for as long as you are taking PrEP. Pregnancy  If you are premenopausal and you may become pregnant, ask your health care provider about preconception counseling.  If you may become pregnant, take 400 to 800 micrograms (mcg) of folic acid every  day.  If you want to prevent pregnancy, talk to your health care provider about birth control (contraception). Osteoporosis and menopause  Osteoporosis is a disease in which the bones lose minerals and strength with aging. This can result in serious bone fractures. Your risk for osteoporosis can be identified using a bone density scan.  If you are 65 years of age or older, or if you are at risk for osteoporosis and fractures, ask your health care provider if you should be screened.  Ask your health care provider whether you should take a calcium or vitamin D supplement to lower your risk for osteoporosis.  Menopause may have certain physical symptoms and risks.  Hormone replacement therapy may reduce some of these symptoms and risks. Talk to your health care provider about whether hormone replacement therapy is right for you. Follow these instructions at home:  Schedule regular health, dental, and eye exams.  Stay current with your immunizations.  Do not use any tobacco products including cigarettes, chewing tobacco, or electronic cigarettes.  If you are pregnant, do not drink alcohol.  If you are breastfeeding, limit how much and how often you drink alcohol.  Limit alcohol intake to no more than 1 drink per day for nonpregnant women. One drink equals 12 ounces of beer, 5 ounces of wine, or 1 ounces of hard liquor.  Do not use street drugs.  Do not share needles.  Ask your health care provider for help if you need support  or information about quitting drugs.  Tell your health care provider if you often feel depressed.  Tell your health care provider if you have ever been abused or do not feel safe at home. This information is not intended to replace advice given to you by your health care provider. Make sure you discuss any questions you have with your health care provider. Document Released: 04/01/2011 Document Revised: 02/22/2016 Document Reviewed: 06/20/2015 Elsevier Interactive Patient Education  2019 [employer]  Carbohydrate Counting for Diabetes Mellitus, Adult  Carbohydrate counting is a method of keeping track of how many carbohydrates you eat. Eating carbohydrates naturally increases the amount of sugar (glucose) in the blood. Counting how many carbohydrates you eat helps keep your blood glucose within normal limits, which helps you manage your diabetes (diabetes mellitus). It is important to know how many carbohydrates you can safely have in each meal. This is different for every person. A diet and nutrition specialist (registered dietitian) can help you make a meal plan and calculate how many carbohydrates you should have at each meal and snack. Carbohydrates are found in the following foods:  Grains, such as breads and cereals.  Dried beans and soy products.  Starchy vegetables, such as potatoes, peas, and corn.  Fruit and fruit juices.  Milk and yogurt.  Sweets and snack foods, such as cake, cookies, candy, chips, and soft drinks. How do I count carbohydrates? There are two ways to count carbohydrates in food. You can use either of the methods or a combination of both. Reading "Nutrition Facts" on packaged food The "Nutrition Facts" list is included on the labels of almost all packaged foods and beverages in the [place] It includes:  The serving size.  Information about nutrients in each serving, including the grams (g) of carbohydrate per serving. To use the "Nutrition  Facts":  Decide how many servings you will have.  Multiply the number of servings by the number of carbohydrates per serving.  The resulting number is the total amount of   carbohydrates that you will be having. Learning standard serving sizes of other foods When you eat carbohydrate foods that are not packaged or do not include "Nutrition Facts" on the label, you need to measure the servings in order to count the amount of carbohydrates:  Measure the foods that you will eat with a food scale or measuring cup, if needed.  Decide how many standard-size servings you will eat.  Multiply the number of servings by 15. Most carbohydrate-rich foods have about 15 g of carbohydrates per serving. ? For example, if you eat 8 oz (170 g) of strawberries, you will have eaten 2 servings and 30 g of carbohydrates (2 servings x 15 g = 30 g).  For foods that have more than one food mixed, such as soups and casseroles, you must count the carbohydrates in each food that is included. The following list contains standard serving sizes of common carbohydrate-rich foods. Each of these servings has about 15 g of carbohydrates:   hamburger bun or  English muffin.   oz (15 mL) syrup.   oz (14 g) jelly.  1 slice of bread.  1 six-inch tortilla.  3 oz (85 g) cooked rice or pasta.  4 oz (113 g) cooked dried beans.  4 oz (113 g) starchy vegetable, such as peas, corn, or potatoes.  4 oz (113 g) hot cereal.  4 oz (113 g) mashed potatoes or  of a large baked potato.  4 oz (113 g) canned or frozen fruit.  4 oz (120 mL) fruit juice.  4-6 crackers.  6 chicken nuggets.  6 oz (170 g) unsweetened dry cereal.  6 oz (170 g) plain fat-free yogurt or yogurt sweetened with artificial sweeteners.  8 oz (240 mL) milk.  8 oz (170 g) fresh fruit or one small piece of fruit.  24 oz (680 g) popped popcorn. Example of carbohydrate counting Sample meal  3 oz (85 g) chicken breast.  6 oz (170 g) brown  rice.  4 oz (113 g) corn.  8 oz (240 mL) milk.  8 oz (170 g) strawberries with sugar-free whipped topping. Carbohydrate calculation 1. Identify the foods that contain carbohydrates: ? Rice. ? Corn. ? Milk. ? Strawberries. 2. Calculate how many servings you have of each food: ? 2 servings rice. ? 1 serving corn. ? 1 serving milk. ? 1 serving strawberries. 3. Multiply each number of servings by 15 g: ? 2 servings rice x 15 g = 30 g. ? 1 serving corn x 15 g = 15 g. ? 1 serving milk x 15 g = 15 g. ? 1 serving strawberries x 15 g = 15 g. 4. Add together all of the amounts to find the total grams of carbohydrates eaten: ? 30 g + 15 g + 15 g + 15 g = 75 g of carbohydrates total. Summary  Carbohydrate counting is a method of keeping track of how many carbohydrates you eat.  Eating carbohydrates naturally increases the amount of sugar (glucose) in the blood.  Counting how many carbohydrates you eat helps keep your blood glucose within normal limits, which helps you manage your diabetes.  A diet and nutrition specialist (registered dietitian) can help you make a meal plan and calculate how many carbohydrates you should have at each meal and snack. This information is not intended to replace advice given to you by your health care provider. Make sure you discuss any questions you have with your health care provider. Document Released: 09/16/2005 Document Revised: 03/26/2017   Document Reviewed: 02/28/2016 Elsevier Interactive Patient Education  [employer].

## 2019-03-04 ENCOUNTER — Telehealth: Payer: Self-pay | Admitting: *Deleted

## 2019-03-04 DIAGNOSIS — N644 Mastodynia: Secondary | ICD-10-CM

## 2019-03-04 NOTE — Telephone Encounter (Signed)
-----   Message from Harrington Challenger, NP sent at 03/03/2019  2:19 PM EDT ----- Was seen last wk Dr Audie Box, needs rt breast diag mammogram and [place], has not heard anything, can go anytime.

## 2019-03-04 NOTE — Telephone Encounter (Signed)
Patient scheduled at [facility] on 03/17/19 @ 1:00pm, patient informed.

## 2019-03-05 LAB — URINALYSIS, COMPLETE W/RFL CULTURE
Bacteria, UA: NONE SEEN /HPF
Bilirubin Urine: NEGATIVE
Glucose, UA: NEGATIVE
Hyaline Cast: NONE SEEN /LPF
Ketones, ur: NEGATIVE
Leukocyte Esterase: NEGATIVE
Nitrites, Initial: NEGATIVE
Protein, ur: NEGATIVE
RBC / HPF: NONE SEEN /HPF (ref 0–2)
Specific Gravity, Urine: 1.01 (ref 1.001–1.03)
pH: 6 (ref 5.0–8.0)

## 2019-03-05 LAB — URINE CULTURE
MICRO NUMBER:: 537069
SPECIMEN QUALITY:: ADEQUATE

## 2019-03-05 LAB — CULTURE INDICATED

## 2019-03-17 ENCOUNTER — Other Ambulatory Visit: Payer: Self-pay

## 2019-03-17 ENCOUNTER — Ambulatory Visit
Admission: RE | Admit: 2019-03-17 | Discharge: 2019-03-17 | Disposition: A | Discharge status: Home / Self Care | Payer: Commercial Managed Care - PPO | Source: Ambulatory Visit | Attending: Gynecology | Admitting: Gynecology

## 2019-03-17 ENCOUNTER — Ambulatory Visit: Payer: Commercial Managed Care - PPO

## 2019-03-17 DIAGNOSIS — N644 Mastodynia: Secondary | ICD-10-CM

## 2019-03-29 ENCOUNTER — Other Ambulatory Visit: Payer: Self-pay | Admitting: Psychiatry

## 2019-03-30 NOTE — Telephone Encounter (Signed)
Has appt. tomorrow

## 2019-03-31 ENCOUNTER — Encounter: Payer: Self-pay | Admitting: Psychiatry

## 2019-03-31 ENCOUNTER — Other Ambulatory Visit: Payer: Self-pay

## 2019-03-31 ENCOUNTER — Ambulatory Visit (INDEPENDENT_AMBULATORY_CARE_PROVIDER_SITE_OTHER): Payer: Commercial Managed Care - PPO | Admitting: Psychiatry

## 2019-03-31 DIAGNOSIS — F4001 Agoraphobia with panic disorder: Secondary | ICD-10-CM

## 2019-03-31 DIAGNOSIS — F411 Generalized anxiety disorder: Secondary | ICD-10-CM | POA: Diagnosis not present

## 2019-03-31 DIAGNOSIS — F5105 Insomnia due to other mental disorder: Secondary | ICD-10-CM | POA: Diagnosis not present

## 2019-03-31 DIAGNOSIS — F3341 Major depressive disorder, recurrent, in partial remission: Secondary | ICD-10-CM | POA: Diagnosis not present

## 2019-03-31 MED ORDER — DOXEPIN HCL 10 MG PO CAPS: 20.0000 mg | ORAL_CAPSULE | Freq: Every day | ORAL | 0 refills | Status: DC

## 2019-03-31 MED ORDER — VORTIOXETINE HBR 10 MG PO TABS: 10.0000 mg | ORAL_TABLET | Freq: Every day | ORAL | 2 refills | Status: DC

## 2019-03-31 NOTE — Progress Notes (Signed)
Jessica Abboticole Katzenberger [id number] 09/15/1976 43 y.o.  Subjective:   Patient ID:  Jessica Molina is a 43 y.o. (DOB 06/22/1976) female.  Virtual Visit via Telephone Note  I connected with pt by telephone and verified that I am speaking with the correct person using two identifiers.   I discussed the limitations, risks, security and privacy concerns of performing an evaluation and management service by telephone and the availability of in person appointments. I also discussed with the patient that there may be a patient responsible charge related to this service. The patient expressed understanding and agreed to proceed.  I discussed the assessment and treatment plan with the patient. The patient was provided an opportunity to ask questions and all were answered. The patient agreed with the plan and demonstrated an understanding of the instructions.   The patient was advised to call back or seek an in-person evaluation if the symptoms worsen or if the condition fails to improve as anticipated.  I provided 15 minutes of non-face-to-face time during this encounter. The call started at 1015 and ended at 1030. The patient was located at home and the provider was located at office.  Chief Complaint:  Chief Complaint  Patient presents with  . Follow-up    depression and anxiety  . Medication Problem    nausea  . Sleeping Problem    Anxiety Symptoms include nausea and nervous/anxious behavior. Patient reports no chest pain, confusion, decreased concentration or suicidal ideas.    Depression        Associated symptoms include no decreased concentration, no fatigue and no suicidal ideas.  Past medical history includes anxiety.    presents  today for follow-up of depression and anxiety and change of meds.  Last seen Feb 02, 2019.  She had good response to Trintellix which was started this year.  She was having some insomnia and doxepin was changed to low-dose mirtazapine for sleep.  Mirtazapine  failed using doxepin for sleep.  More nausea after taking Trintellix in the morning for a bout a month.  Disc with PCP and no other cause.  Taking in the morning with food.  Usually N after Trintellix lasting a couple hours.  Vomiting 2-3 times weekly.  A lot less anxious and feels pretty good emotionally.  Not really depressed unless a bad day.  Some residual anxiety. Exercising more and sleep better with doxepin now.  Trintellix helps a lot.  More active and motivated.  Interested.  A lot of restless sleep and anxiety can worsen it.  More anxiety if goes out I public and not doing that.  Sleep 4-6 hours.  Normal is 7-8 hours.  Will awaken startled but doesn't remember dreams.  Trying to exercise more.    Switched to [employer] bc D benefits and low SE.  Definitely less depressed and anxiety still occurs but is better.  Not sad daily.  Better motivation and interest.    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  Another D is bulimic.  Both are high maintenance.  Has to watch D 24/7.  Patient had a personal meltdown relation to her daughters behavior last night.  Patient does not feel she can control her own reactions but is not going to hurt herself nor her daughter.  She feels that this is due to the severity of her own depression and anxiety.  Past psych med trials:  Hx paxil but caused urinary retention, Zoloft without help at 89 and some wt gain, Buspar NR.  2 D's on Trintellix helped 1 of the 2 kids, Abilify.  Review of Systems:  Review of Systems  Constitutional: Negative for fatigue.  Cardiovascular: Negative for chest pain.  Gastrointestinal: Positive for nausea and vomiting.  Neurological: Negative for tremors and weakness.  Psychiatric/Behavioral: Positive for depression. Negative for agitation, behavioral problems, confusion, decreased concentration, dysphoric mood, hallucinations, self-injury, sleep disturbance and suicidal ideas. The patient is nervous/anxious.  The patient is not hyperactive.     Medications: I have reviewed the patient's current medications.  Current Outpatient Medications  Medication Sig Dispense Refill  . ARIPiprazole (ABILIFY) 5 MG tablet TAKE 1 TABLET BY MOUTH EVERY DAY 90 tablet 0  . clonazePAM (KLONOPIN) 0.5 MG tablet TAKE 1 TABLET BY MOUTH TWICE A DAY AND 1 TO 2 TABLETS AT BEDTIME AS NEEDED 120 tablet 2  . doxepin (SINEQUAN) 10 MG capsule TAKE 1 TO 2 CAPSULES BY MOUTH AT BEDTIME AS NEEDED 180 capsule 0  . Fremanezumab-vfrm (AJOVY) 225 MG/1.5ML SOSY Inject 225 mg into the skin every 30 (thirty) days. 1 mL 11  . hydrochlorothiazide (HYDRODIURIL) 25 MG tablet Take 1 tablet (25 mg total) by mouth daily. 90 tablet 1  . losartan (COZAAR) 100 MG tablet Take 1 tablet (100 mg total) by mouth daily. 90 tablet 1  . Multiple Vitamin (MULTIVITAMIN WITH MINERALS) TABS tablet Take 1 tablet by mouth daily.    Marland Kitchen. tiZANidine (ZANAFLEX) 4 MG tablet Take 1 tablet (4 mg total) by mouth every 6 (six) hours as needed for muscle spasms. 30 tablet 11  . traMADol (ULTRAM) 50 MG tablet TAKE 1 TABLET BY MOUTH EVERY 6 HRS AS NEEDED. MUST LAST 30 DAYS 30 tablet 0  . TRINTELLIX 20 MG TABS tablet TAKE 1 TABLET BY MOUTH EVERY DAY 90 tablet 1  . mirtazapine (REMERON) 15 MG tablet TAKE 1 TABLET BY MOUTH EVERYDAY AT BEDTIME (Patient not taking: Reported on 03/31/2019) 90 tablet 0   Current Facility-Administered Medications  Medication Dose Route Frequency Provider Last Rate Last Dose  . 0.9 %  sodium chloride infusion  500 mL Intravenous Once Lynann BolognaGupta, Rajesh, MD        Medication Side Effects: no sex drive still without change.  No sex desire for a year or so.    Allergies:  Allergies  Allergen Reactions  . Acetaminophen Itching  . Lisinopril Cough  . Metoprolol Other (See Comments)    did not feel well when taking     Past Medical History:  Diagnosis Date  . 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 Anxiety   . GERD (gastroesophageal reflux disease)   . Hypertension   . Migraine   . PONV (postoperative nausea and vomiting)    hard to wake up after appendectomy  . Renal vein stenosis    "Kinked"    Family History  Problem Relation Age of Onset  . Breast cancer Mother        breast   . Thyroid disease Mother   . Cancer Mother [id number]       breast  . Hyperlipidemia Mother   . Hypertension Father   . Stroke Father   . Melanoma Father        melanoma it is terminal   . Cancer Father        melanoma  . Breast cancer Maternal Grandmother        breast   . Thyroid disease Maternal Grandmother   .  Cancer Maternal Grandfather        unknown   . Cancer Paternal Grandmother        unknow   . Colon cancer Neg Hx   . Esophageal cancer Neg Hx   . Esophageal varices Neg Hx   . Stomach cancer Neg Hx     Social History   Socioeconomic History  . Marital status: Married    Spouse name: Not on file  . Number of children: 4  . Years of education: HS+  . Highest education level: Not on file  Occupational History  . Occupation: Psychologist, sport and exercisenurse tech  Social Needs  . Financial resource strain: Not on file  . Food insecurity    Worry: Not on file    Inability: Not on file  . Transportation needs    Medical: Not on file    Non-medical: Not on file  Tobacco Use  . Smoking status: Never Smoker  . Smokeless tobacco: Never Used  Substance and Sexual Activity  . Alcohol use: Yes    Alcohol/week: 0.0 standard drinks    Comment: 2-3 drinks per week  . Drug use: No  . Sexual activity: Yes    Partners: Male    Birth control/protection: Surgical  Lifestyle  . Physical activity    Days per week: Not on file    Minutes per session: Not on file  . Stress: Not on file  Relationships  . Social Musicianconnections    Talks on phone: Not on file    Gets together: Not on file    Attends religious service: Not on file    Active member of club or organization: Not on file    Attends meetings  of clubs or organizations: Not on file    Relationship status: Not on file  . Intimate partner violence    Fear of current or ex partner: Not on file    Emotionally abused: Not on file    Physically abused: Not on file    Forced sexual activity: Not on file  Other Topics Concern  . Not on file  Social History Narrative   Lives at home with husband and children.   Right-handed.   2-3 cups caffeine per week.    Past Medical History, Surgical history, Social history, and Family history were reviewed and updated as appropriate.   Please see review of systems for further details on the patient's review from today.   Objective:   Physical Exam:  LMP 06/19/2016 (Exact Date)   Physical Exam Neurological:     Mental Status: She is alert and oriented to person, place, and time.     Cranial Nerves: No dysarthria.  Psychiatric:        Attention and Perception: Attention normal. She does not perceive auditory hallucinations.        Mood and Affect: Mood is anxious. Mood is not depressed.        Speech: Speech normal.        Behavior: Behavior is cooperative.        Thought Content: Thought content normal. Thought content is not paranoid or delusional. Thought content does not include homicidal or suicidal ideation. Thought content does not include homicidal or suicidal plan.        Cognition and Memory: Cognition and memory normal.        Judgment: Judgment normal.     Comments: Insight good.     Lab Review:     Component Value Date/Time   NA 139  08/20/2018 1424   K 4.2 08/20/2018 1424   CL 102 08/20/2018 1424   CO2 24 08/20/2018 1424   GLUCOSE 78 08/20/2018 1424   BUN 11 08/20/2018 1424   CREATININE 1.09 08/20/2018 1424   CALCIUM 10.5 08/20/2018 1424   PROT 7.4 08/20/2018 1424   ALBUMIN 4.7 08/20/2018 1424   AST 41 (H) 08/20/2018 1424   ALT 68 (H) 08/20/2018 1424   ALKPHOS 65 08/20/2018 1424   BILITOT 0.4 08/20/2018 1424   GFRNONAA 55 (L) 08/18/2018 1325   GFRAA >60  08/18/2018 1325       Component Value Date/Time   WBC 7.3 08/20/2018 1424   RBC 4.14 08/20/2018 1424   HGB 12.8 08/20/2018 1424   HCT 37.9 08/20/2018 1424   PLT 487.0 (H) 08/20/2018 1424   MCV 91.5 08/20/2018 1424   MCH 30.4 08/18/2018 1325   MCHC 33.9 08/20/2018 1424   RDW 12.6 08/20/2018 1424   LYMPHSABS 2.2 08/20/2018 1424   MONOABS 0.4 08/20/2018 1424   EOSABS 0.1 08/20/2018 1424   BASOSABS 0.1 08/20/2018 1424    No results found for: POCLITH, LITHIUM   No results found for: PHENYTOIN, PHENOBARB, VALPROATE, CBMZ   .res Assessment: Plan:    Ayriana was seen today for follow-up, medication problem and sleeping problem.  Diagnoses and all orders for this visit:  Recurrent major depression in partial remission ([place])  Panic disorder with agoraphobia  Generalized anxiety disorder  Insomnia due to mental condition   Emphasize the need to keep appointments and especially when she is not doing well.  She had benefit from the Paxil but had side effects affecting urination and took herself off of it.  Trial Ttrintellix reducing to 10 mg daily bc NV.  Clearly better with it.  Call if relapse depression and anxiety.    Consider DC abilify later. Now is not good time bc reducing Trintellix. Discussed potential metabolic side effects associated with atypical antipsychotics, as well as potential risk for movement side effects. Advised pt to contact office if movement side effects occur.   We discussed the short-term risks associated with benzodiazepines including sedation and increased fall risk among others.  Discussed long-term side effect risk including dependence, potential withdrawal symptoms, and the potential eventual dose-related risk of dementia.  Agree with counselor.  Likes new therapist.  Cont doxepin as it's now helpful.  Disc SE.  FU 2 mos  Lynder Parents, MD, DFAPA   Please see After Visit Summary for patient specific instructions.  Future Appointments   Date Time Provider [place]  07/29/2019  8:15 AM Roma Schanz R, DO [facility]    No orders of the defined types were placed in this encounter.     -------------------------------

## 2019-05-13 ENCOUNTER — Other Ambulatory Visit: Payer: Self-pay | Admitting: Psychiatry

## 2019-06-03 ENCOUNTER — Other Ambulatory Visit: Payer: Self-pay | Admitting: Adult Health

## 2019-06-05 ENCOUNTER — Other Ambulatory Visit: Payer: Self-pay | Admitting: Adult Health

## 2019-06-09 ENCOUNTER — Telehealth: Payer: Self-pay | Admitting: Neurology

## 2019-06-09 ENCOUNTER — Telehealth: Payer: Self-pay | Admitting: Adult Health

## 2019-06-09 MED ORDER — TIZANIDINE HCL 4 MG PO TABS: 4.0000 mg | ORAL_TABLET | Freq: Four times a day (QID) | ORAL | 11 refills | Status: DC | PRN

## 2019-06-09 NOTE — Telephone Encounter (Signed)
Pt has called for a refill tiZANidine (ZANAFLEX) 4 MG tablet  [facility]

## 2019-06-09 NOTE — Telephone Encounter (Signed)
I spoke with the patient who no longer wants to continue Botox and does not want her medication. DW

## 2019-06-09 NOTE — Addendum Note (Signed)
Addended by: Oliver Hum S on: 06/09/2019 11:18 AM   Modules accepted: Orders

## 2019-06-11 ENCOUNTER — Other Ambulatory Visit: Payer: Self-pay | Admitting: Psychiatry

## 2019-06-11 DIAGNOSIS — F4001 Agoraphobia with panic disorder: Secondary | ICD-10-CM

## 2019-06-16 ENCOUNTER — Other Ambulatory Visit: Payer: Self-pay

## 2019-06-16 ENCOUNTER — Ambulatory Visit (INDEPENDENT_AMBULATORY_CARE_PROVIDER_SITE_OTHER): Payer: Commercial Managed Care - PPO | Admitting: Family Medicine

## 2019-06-16 ENCOUNTER — Encounter: Payer: Self-pay | Admitting: Family Medicine

## 2019-06-16 DIAGNOSIS — J029 Acute pharyngitis, unspecified: Secondary | ICD-10-CM

## 2019-06-16 DIAGNOSIS — Z20822 Contact with and (suspected) exposure to covid-19: Secondary | ICD-10-CM

## 2019-06-16 DIAGNOSIS — J0140 Acute pansinusitis, unspecified: Secondary | ICD-10-CM | POA: Diagnosis not present

## 2019-06-16 MED ORDER — CEFDINIR 300 MG PO CAPS: 300.0000 mg | ORAL_CAPSULE | Freq: Two times a day (BID) | ORAL | 0 refills | Status: DC

## 2019-06-16 MED ORDER — FLUTICASONE PROPIONATE 50 MCG/ACT NA SUSP: 2.0000 | Freq: Every day | NASAL | 6 refills | Status: DC

## 2019-06-16 NOTE — Progress Notes (Signed)
Virtual Visit via Video Note  I connected with Jessica Molina on 06/16/19 at 10:20 AM EDT by a video enabled telemedicine application and verified that I am speaking with the correct person using two identifiers.  Location: Patient: home  Provider: home    I discussed the limitations of evaluation and management by telemedicine and the availability of in person appointments. The patient expressed understanding and agreed to proceed.  History of Present Illness: Pt is home c/o congestion and sorethroat , sinus headache and cough.  No otc   no fevers    + green mucus  Symptoms started yesterday  Observations/Objective: 98.8 temp-- no other vitals obtained Pt is in NAD  No sob   Assessment and Plan: 1. Sore throat  - Novel Coronavirus, NAA ([facility]) - fluticasone (FLONASE) 50 MCG/ACT nasal spray; Place 2 sprays into both nostrils daily.  Dispense: 16 g; Refill: 6  2. Acute non-recurrent pansinusitis abx per orders,  flonase Can  Use mucinex / delsym for cough prn  - cefdinir (OMNICEF) 300 MG capsule; Take 1 capsule (300 mg total) by mouth 2 (two) times daily.  Dispense: 20 capsule; Refill: 0   Follow Up Instructions:    I discussed the assessment and treatment plan with the patient. The patient was provided an opportunity to ask questions and all were answered. The patient agreed with the plan and demonstrated an understanding of the instructions.   The patient was advised to call back or seek an in-person evaluation if the symptoms worsen or if the condition fails to improve as anticipated.  I provided 15 minutes of non-face-to-face time during this encounter.   Ann Held, DO

## 2019-06-17 LAB — NOVEL CORONAVIRUS, NAA: SARS-CoV-2, NAA: NOT DETECTED

## 2019-06-22 ENCOUNTER — Encounter: Payer: Self-pay | Admitting: Gynecology

## 2019-06-22 ENCOUNTER — Other Ambulatory Visit: Payer: Self-pay | Admitting: Psychiatry

## 2019-06-22 DIAGNOSIS — F5105 Insomnia due to other mental disorder: Secondary | ICD-10-CM

## 2019-06-25 ENCOUNTER — Other Ambulatory Visit: Payer: Self-pay | Admitting: Family Medicine

## 2019-06-30 ENCOUNTER — Other Ambulatory Visit: Payer: Self-pay | Admitting: Psychiatry

## 2019-06-30 DIAGNOSIS — F331 Major depressive disorder, recurrent, moderate: Secondary | ICD-10-CM

## 2019-07-19 ENCOUNTER — Other Ambulatory Visit: Payer: Self-pay | Admitting: Psychiatry

## 2019-07-19 ENCOUNTER — Other Ambulatory Visit: Payer: Self-pay | Admitting: Family Medicine

## 2019-07-19 DIAGNOSIS — F5105 Insomnia due to other mental disorder: Secondary | ICD-10-CM

## 2019-07-28 ENCOUNTER — Other Ambulatory Visit: Payer: Self-pay

## 2019-07-29 ENCOUNTER — Encounter: Payer: Self-pay | Admitting: Family Medicine

## 2019-07-29 ENCOUNTER — Other Ambulatory Visit: Payer: Self-pay

## 2019-07-29 ENCOUNTER — Ambulatory Visit (INDEPENDENT_AMBULATORY_CARE_PROVIDER_SITE_OTHER): Payer: Commercial Managed Care - PPO | Admitting: Family Medicine

## 2019-07-29 VITALS — BP 112/84 | HR 72 | Temp 97.7°F | Resp 18 | Ht 63.0 in | Wt 174.0 lb

## 2019-07-29 DIAGNOSIS — Z23 Encounter for immunization: Secondary | ICD-10-CM | POA: Diagnosis not present

## 2019-07-29 DIAGNOSIS — I10 Essential (primary) hypertension: Secondary | ICD-10-CM

## 2019-07-29 NOTE — Patient Instructions (Signed)

## 2019-07-29 NOTE — Progress Notes (Signed)
Patient ID: Jessica Molina, female    DOB: Mar 11, 1976  Age: 43 y.o. MRN: [id number]    Subjective:  Subjective  HPI Jessica Molina presents for f/u bp.   No complaints     Review of Systems  Constitutional: Negative for appetite change, diaphoresis, fatigue and unexpected weight change.  Eyes: Negative for pain, redness and visual disturbance.  Respiratory: Negative for cough, chest tightness, shortness of breath and wheezing.   Cardiovascular: Negative for chest pain, palpitations and leg swelling.  Endocrine: Negative for cold intolerance, heat intolerance, polydipsia, polyphagia and polyuria.  Genitourinary: Negative for difficulty urinating, dysuria and frequency.  Neurological: Negative for dizziness, light-headedness, numbness and headaches.    History Past Medical History:  Diagnosis Date  . Aneurysm ([place]) 01/2015   Left carotid, repaired with no special restrictions or treatments to follow  . Anxiety   . GERD (gastroesophageal reflux disease)   . Hypertension   . Migraine   . PONV (postoperative nausea and vomiting)    hard to wake up after appendectomy  . Renal vein stenosis    "Kinked"    She has a past surgical history that includes Appendectomy; Renal angioplasty; ANEURYSM SURGERY (01/2015); Cesarean section; BTL (2006); Laparoscopic vaginal hysterectomy with salpingectomy (Bilateral, 06/25/2016); Cystoscopy (N/A, 06/25/2016); Abdominal hysterectomy; and Colonoscopy.   Her family history includes Breast cancer in her maternal grandmother and mother; Cancer in her father, maternal grandfather, and paternal grandmother; Cancer (age of onset: 37) in her mother; Hyperlipidemia in her mother; Hypertension in her father; Melanoma in her father; Stroke in her father; Thyroid disease in her maternal grandmother and mother.She reports that she has never smoked. She has never used smokeless tobacco. She reports current alcohol use. She reports that she does not use drugs.  Current  Outpatient Medications on File Prior to Visit  Medication Sig Dispense Refill  . clonazePAM (KLONOPIN) 0.5 MG tablet TAKE 1 TABLET BY MOUTH TWICE A DAY AND TAKE 2 TABLETS BY MOUTH AT BEDTIME AS NEEDED 120 tablet 1  . doxepin (SINEQUAN) 10 MG capsule TAKE 2 CAPSULES (20 MG TOTAL) BY MOUTH AT BEDTIME. 180 capsule 0  . fluticasone (FLONASE) 50 MCG/ACT nasal spray Place 2 sprays into both nostrils daily. 16 g 6  . hydrochlorothiazide (HYDRODIURIL) 25 MG tablet TAKE 1 TABLET BY MOUTH EVERY DAY 90 tablet 1  . losartan (COZAAR) 100 MG tablet TAKE 1 TABLET BY MOUTH EVERY DAY 90 tablet 1  . Multiple Vitamin (MULTIVITAMIN WITH MINERALS) TABS tablet Take 1 tablet by mouth daily.    Marland Kitchen tiZANidine (ZANAFLEX) 4 MG tablet Take 1 tablet (4 mg total) by mouth every 6 (six) hours as needed for muscle spasms. 30 tablet 11  . traMADol (ULTRAM) 50 MG tablet TAKE 1 TABLET BY MOUTH EVERY 6 HRS AS NEEDED. MUST LAST 30 DAYS 30 tablet 0  . ARIPiprazole (ABILIFY) 5 MG tablet TAKE 1 TABLET BY MOUTH EVERY DAY (Patient not taking: Reported on 07/29/2019) 90 tablet 0  . cefdinir (OMNICEF) 300 MG capsule Take 1 capsule (300 mg total) by mouth 2 (two) times daily. (Patient not taking: Reported on 07/29/2019) 20 capsule 0  . Fremanezumab-vfrm (AJOVY) 225 MG/1.5ML SOSY Inject 225 mg into the skin every 30 (thirty) days. (Patient not taking: Reported on 07/29/2019) 1 mL 11  . mirtazapine (REMERON) 15 MG tablet TAKE 1 TABLET BY MOUTH EVERYDAY AT BEDTIME (Patient not taking: Reported on 07/29/2019) 90 tablet 0  . vortioxetine HBr (TRINTELLIX) 10 MG TABS tablet Take 1 tablet (10 mg  total) by mouth daily. (Patient not taking: Reported on 07/29/2019) 30 tablet 2   Current Facility-Administered Medications on File Prior to Visit  Medication Dose Route Frequency Provider Last Rate Last Dose  . 0.9 %  sodium chloride infusion  500 mL Intravenous Once Jackquline Denmark, MD         Objective:  Objective  Physical Exam Vitals signs and nursing  note reviewed.  Constitutional:      Appearance: She is well-developed.  HENT:     Head: Normocephalic and atraumatic.  Eyes:     Conjunctiva/sclera: Conjunctivae normal.  Neck:     Musculoskeletal: Normal range of motion and neck supple.     Thyroid: No thyromegaly.     Vascular: No carotid bruit or JVD.  Cardiovascular:     Rate and Rhythm: Normal rate and regular rhythm.     Heart sounds: Normal heart sounds. No murmur.  Pulmonary:     Effort: Pulmonary effort is normal. No respiratory distress.     Breath sounds: Normal breath sounds. No wheezing or rales.  Chest:     Chest wall: No tenderness.  Neurological:     Mental Status: She is alert and oriented to person, place, and time.    BP 112/84 (BP Location: Left Arm, Patient Position: Sitting, Cuff Size: Normal)   Pulse 72   Temp 97.7 F (36.5 C) (Temporal)   Resp 18   Ht _0  (1.6 m)   Wt 174 lb (78.9 kg)   LMP 06/19/2016 (Exact Date)   SpO2 98%   BMI 30.82 kg/m  Wt Readings from Last 3 Encounters:  07/29/19 174 lb (78.9 kg)  03/03/19 184 lb (83.5 kg)  09/18/18 192 lb (87.1 kg)     Lab Results  Component Value Date   WBC 7.3 08/20/2018   HGB 12.8 08/20/2018   HCT 37.9 08/20/2018   PLT 487.0 (H) 08/20/2018   GLUCOSE 78 08/20/2018   CHOL 242 (H) 08/14/2018   TRIG 286 (H) 08/14/2018   HDL 45 08/14/2018   LDLCALC 140 (H) 08/14/2018   ALT 68 (H) 08/20/2018   AST 41 (H) 08/20/2018   NA 139 08/20/2018   K 4.2 08/20/2018   CL 102 08/20/2018   CREATININE 1.09 08/20/2018   BUN 11 08/20/2018   CO2 24 08/20/2018   TSH 0.88 04/10/2017   HGBA1C 5.4 08/14/2018    Mm Diag Breast Tomo Uni Right  Result Date: 03/17/2019 CLINICAL DATA:  Patient complains of diffuse right breast pain. EXAM: DIGITAL DIAGNOSTIC UNILATERAL RIGHT MAMMOGRAM WITH CAD AND TOMO COMPARISON:  Previous exam(s). ACR Breast Density Category b: There are scattered areas of fibroglandular density. FINDINGS: No suspicious mass, malignant type  microcalcifications or distortion detected in the right breast. Mammographic images were processed with CAD. IMPRESSION: No evidence of malignancy in the right breast. RECOMMENDATION: Bilateral diagnostic mammogram in November of 2020 is recommended. I have discussed the findings and recommendations with the patient. Results were also provided in writing at the conclusion of the visit. If applicable, a reminder letter will be sent to the patient regarding the next appointment. BI-RADS CATEGORY  1: Negative. Electronically Signed   By: Lillia Mountain M.D.   On: 03/17/2019 13:50     Assessment & Plan:  Plan  I am having Jessica Molina maintain her multivitamin with minerals, Fremanezumab-vfrm, traMADol, mirtazapine, vortioxetine HBr, ARIPiprazole, tiZANidine, clonazePAM, cefdinir, fluticasone, doxepin, hydrochlorothiazide, and losartan. We will continue to administer sodium chloride.  No orders of the defined types  were placed in this encounter.   Problem List Items Addressed This Visit      Unprioritized   Essential hypertension    Well controlled, no changes to meds. Encouraged heart healthy diet such as the DASH diet and exercise as tolerated.       Relevant Orders   Lipid panel   Comp Met (CMET)    Other Visit Diagnoses    Need for tetanus booster    -  Primary   Relevant Orders   Tdap vaccine greater than or equal to 7yo IM (Completed)      Follow-up: Return in about 6 months (around 01/27/2020) for annual exam, fasting.  Ann Held, DO

## 2019-07-29 NOTE — Assessment & Plan Note (Signed)
Well controlled, no changes to meds. Encouraged heart healthy diet such as the DASH diet and exercise as tolerated.  °

## 2019-08-03 ENCOUNTER — Other Ambulatory Visit: Payer: Self-pay | Admitting: Psychiatry

## 2019-08-03 DIAGNOSIS — F5105 Insomnia due to other mental disorder: Secondary | ICD-10-CM

## 2019-09-01 ENCOUNTER — Other Ambulatory Visit: Payer: Self-pay

## 2019-09-01 ENCOUNTER — Encounter: Payer: Self-pay | Admitting: Adult Health

## 2019-09-01 ENCOUNTER — Ambulatory Visit (INDEPENDENT_AMBULATORY_CARE_PROVIDER_SITE_OTHER): Payer: Commercial Managed Care - PPO | Admitting: Adult Health

## 2019-09-01 VITALS — BP 120/88 | HR 65 | Temp 97.6°F | Ht 63.0 in | Wt 168.0 lb

## 2019-09-01 DIAGNOSIS — G43709 Chronic migraine without aura, not intractable, without status migrainosus: Secondary | ICD-10-CM

## 2019-09-01 MED ORDER — AIMOVIG 70 MG/ML ~~LOC~~ SOAJ: 1.0000 | SUBCUTANEOUS | Status: DC

## 2019-09-01 NOTE — Progress Notes (Signed)
PATIENT: Jessica Molina DOB: Feb 06, 1976  Chief Complaint  Patient presents with  . Follow-up  . Migraine    needs refill tramadol, last botox in 10/2016.  Having daily headaches  Has headache today.  Trigger stress (daughter at 43 wit anorexia, father ill)  Notes that being consistent with Botox, she was better.     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  She continues to be followed in this office by Dr. Krista Blue.  Migraine headaches have been present since 2015 occasionally with visual aura.   Update 08/04/2018: Patient is being seen today for migraine follow-up and medication management.  After prior visit 1 year ago, patient was started on Ajovy along with zonisamide for better migraine management.  She has continued taking the Ajovy but has stopped the zonisamide as she states she was not seeing benefit of continuing. She does state that her migraines have improved with 1-2 migraines per week which last approx. 8-12 hours which are associated with photophobia, phonophobia, and N/V.  She does state that the headache duration and intensity has greatly improved.  When migraine onset, she will take the tizanidine and if she continues to have a migraine hours after use of tizanidine, she will take tramadol which will usually break her migraine.  She does state that she last filled tramadol on 07/20/2018 but while cleaning, the pills accidentally fell into the toilet.  Review of PMP shows recent refills on 03/25/2018, 06/09/2018 and 07/20/2018.  She does endorse being under a great deal of stress at this time due to family medical issues.  No further concerns at this time.  Update 09/01/2019: Jessica Molina is a 43 year old female who is being seen today for migraine follow-up.  She has continued to experience  1-2 migraines per week which are debilitating where she will have to lay down and they will typically be resolved by the next day.  She will rotate tramadol and tizanidine as needed.  She has self discontinued Ajovy due to limited benefit and has not noticed any worsening since discontinuing.  She continues to follow with behavioral health for depression and anxiety.  She does endorse her husband losing his job due to COVID-19 and will be losing insurance coverage.  No further concerns at this time.     REVIEW OF SYSTEMS: Full 14 system review of systems performed and notable only for migraines  ALLERGIES: Allergies  Allergen Reactions  . Lisinopril Cough  . Metoprolol Other (See Comments)    did not feel well when taking     Allergies as of 09/01/2019      Reactions   Acetaminophen Itching   Lisinopril Cough, Other (See Comments)   Makes blood pressure really low    Metoprolol Other (See Comments)   did not feel well when taking       Medication List       Accurate as of September 01, 2019  1:09 PM. If you have any questions, ask your nurse or doctor.        STOP taking these medications   cefdinir 300 MG capsule Commonly known as: OMNICEF Stopped by: Frann Rider, NP   mirtazapine 15 MG tablet Commonly known as: REMERON Stopped by: Frann Rider, NP   vortioxetine HBr 10 MG Tabs tablet Commonly known as: Trintellix Stopped  by: Ihor AustinJessica McCue, NP     TAKE these medications   ARIPiprazole 5 MG tablet Commonly known as: ABILIFY TAKE 1 TABLET BY MOUTH EVERY DAY   clonazePAM 0.5 MG tablet Commonly known as: KLONOPIN TAKE 1 TABLET BY MOUTH TWICE A DAY AND TAKE 2 TABLETS BY MOUTH AT BEDTIME AS NEEDED   doxepin 10 MG capsule Commonly known as: SINEQUAN TAKE 2 CAPSULES (20 MG TOTAL) BY MOUTH AT BEDTIME.   fluticasone 50 MCG/ACT nasal spray Commonly known as: FLONASE Place 2 sprays into both nostrils daily.   Fremanezumab-vfrm 225 MG/1.5ML Sosy Commonly known as:  Ajovy Inject 225 mg into the skin every 30 (thirty) days.   hydrochlorothiazide 25 MG tablet Commonly known as: HYDRODIURIL TAKE 1 TABLET BY MOUTH EVERY DAY   losartan 100 MG tablet Commonly known as: COZAAR TAKE 1 TABLET BY MOUTH EVERY DAY   multivitamin with minerals Tabs tablet Take 1 tablet by mouth daily.   tiZANidine 4 MG tablet Commonly known as: Zanaflex Take 1 tablet (4 mg total) by mouth every 6 (six) hours as needed for muscle spasms.   traMADol 50 MG tablet Commonly known as: ULTRAM TAKE 1 TABLET BY MOUTH EVERY 6 HRS AS NEEDED. MUST LAST 30 DAYS        PAST MEDICAL HISTORY: Past Medical History:  Diagnosis Date  . 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 Anxiety   . GERD (gastroesophageal reflux disease)   . Hypertension   . Migraine   . PONV (postoperative nausea and vomiting)    hard to wake up after appendectomy  . Renal vein stenosis    "Kinked"    FAMILY HISTORY: Family History  Problem Relation Age of Onset  . Breast cancer Mother        breast   . Thyroid disease Mother   . Cancer Mother [id number]       breast  . Hyperlipidemia Mother   . Hypertension Father   . Stroke Father   . Melanoma Father        melanoma it is terminal   . Cancer Father        melanoma  . Breast cancer Maternal Grandmother        breast   . Thyroid disease Maternal Grandmother   . Cancer Maternal Grandfather        unknown   . Cancer Paternal Grandmother        unknow   . Colon cancer Neg Hx   . Esophageal cancer Neg Hx   . Esophageal varices Neg Hx   . Stomach cancer Neg Hx     SOCIAL HISTORY:  Social History   Socioeconomic History  . Marital status: Married    Spouse name: Not on file  . Number of children: 4  . Years of education: HS+  . Highest education level: Not on file  Occupational History  . Occupation: Psychologist, sport and exercisenurse tech  Social Needs  . Financial resource strain: Not on file  . Food  insecurity    Worry: Not on file    Inability: Not on file  . Transportation needs    Medical: Not on file    Non-medical: Not on file  Tobacco Use  . Smoking status: Never Smoker  . Smokeless tobacco: Never Used  Substance and Sexual Activity  . Alcohol use: Yes    Alcohol/week: 0.0 standard drinks    Comment: 2-3 drinks per week  .  Drug use: No  . Sexual activity: Yes    Partners: Male    Birth control/protection: Surgical  Lifestyle  . Physical activity    Days per week: Not on file    Minutes per session: Not on file  . Stress: Not on file  Relationships  . Social Musician on phone: Not on file    Gets together: Not on file    Attends religious service: Not on file    Active member of club or organization: Not on file    Attends meetings of clubs or organizations: Not on file    Relationship status: Not on file  . Intimate partner violence    Fear of current or ex partner: Not on file    Emotionally abused: Not on file    Physically abused: Not on file    Forced sexual activity: Not on file  Other Topics Concern  . Not on file  Social History Narrative   Lives at home with husband and children.   Right-handed.   2-3 cups caffeine per week.       PHYSICAL EXAM   Today's Vitals   09/01/19 1235  BP: 120/88  Pulse: 65  Temp: 97.6 F (36.4 C)  Weight: 168 lb (76.2 kg)  Height: 5\' 3"  (1.6 m)   Body mass index is 29.76 kg/m.   Body mass index is 29.76 kg/m.   PHYSICAL EXAMNIATION:  Gen: NAD, pleasant middle-aged Caucasian female, conversant, well nourised, obese, well groomed  Cardiovascular: Regular rate rhythm, no peripheral edema, warm, nontender. Eyes: Conjunctivae clear without exudates or hemorrhage Neck: Supple, no carotid bruise. Pulmonary: Clear to auscultation bilaterally   NEUROLOGICAL EXAM:  MENTAL STATUS: Speech:    Speech is normal; fluent and spontaneous with normal comprehension.  Cognition:     Orientation to  time, place and person     Normal recent and remote memory     Normal Attention span and concentration     Normal Language, naming, repeating,spontaneous speech     Fund of knowledge appropriate   CRANIAL NERVES: CN II: Visual fields are full to confrontation. Pupils are round equal and briskly reactive to light. CN III, IV, VI: extraocular movement are normal. No ptosis. CN V: Facial sensation is intact  CN VII: Face is symmetric with normal eye closure and smile. CN VIII: Hearing is normal to rubbing fingers CN IX, X: Palate elevates symmetrically. Phonation is normal. CN XI: Head turning and shoulder shrug are intact CN XII: Tongue is midline with normal movements and no atrophy.  MOTOR: There is no pronator drift of out-stretched arms. Muscle bulk and tone are normal. Muscle strength is normal.  REFLEXES: Reflexes are 2+ and symmetric at the biceps, triceps, knees, and ankles.   SENSORY: Intact to light touch, pinprick, positional sensation and vibratory sensation are intact in fingers and toes.  COORDINATION: Rapid alternating movements and fine finger movements are intact. There is no dysmetria on finger-to-nose and heel-knee-shin.    GAIT/STANCE: Posture is normal. Gait is steady with normal steps, base, arm swing, and turning. Heel and toe walking are normal. Tandem gait is normal.      ASSESSMENT AND PLAN  Corrinna Karapetyan is a 43 y.o. female   Chronic migraine headaches History of left carotid artery aneurysm, status post Starstent placement  Discussion regarding initiating Aimovig monthly injections with ongoing use of tizanidine and tramadol as needed.  Due to lack of insurance coverage, safety net foundation paperwork completed  by patient and will fax for possible assistance in medication coverage.  Discussion regarding use of good Rx to assist with coverage of tizanidine and tramadol.  She has failed multiple medications in the past without benefit and recently  self discontinued Ajovy due to lack of benefit.  Advised patient that our office will keep her updated regarding determination of assistance.  Recommend follow-up in 6 months or call earlier if needed    Greater than 50% of time during this 15 minute visit was spent on discussing ongoing migraines and further management recommendations and answered all questions to patient satisfaction     Ihor Austin, [facility]  [facility] [address] [place], [place] [address]  Phone [phone] Fax [phone] Note: This document was prepared with digital dictation and possible smart phrase technology. Any transcriptional errors that result from this process are unintentional.

## 2019-09-01 NOTE — Patient Instructions (Signed)
Your Plan:  Trial Aimovig due to ongoing migraines Continue tramadol and tizanidine as needed   Follow-up in 6 months or call earlier if needed     Thank you for coming to see [place] at [facility]. I hope we have been able to provide you high quality care today.  You may receive a patient satisfaction survey over the next few weeks. We would appreciate your feedback and comments so that we may continue to improve ourselves and the health of our patients.

## 2019-09-02 ENCOUNTER — Other Ambulatory Visit: Payer: Self-pay | Admitting: Women's Health

## 2019-09-02 DIAGNOSIS — Z1231 Encounter for screening mammogram for malignant neoplasm of breast: Secondary | ICD-10-CM

## 2019-09-03 ENCOUNTER — Other Ambulatory Visit: Payer: Self-pay | Admitting: Neurology

## 2019-09-06 ENCOUNTER — Telehealth: Payer: Self-pay | Admitting: *Deleted

## 2019-09-06 NOTE — Telephone Encounter (Signed)
-  Received fax from [employer] for [employer].  - fax [phone], [phone].   approved till 09-05-20.

## 2019-09-15 ENCOUNTER — Other Ambulatory Visit: Payer: Self-pay | Admitting: Psychiatry

## 2019-09-15 DIAGNOSIS — F4001 Agoraphobia with panic disorder: Secondary | ICD-10-CM

## 2019-09-15 NOTE — Telephone Encounter (Signed)
Apt 12/31, refills okay for her?

## 2019-09-30 ENCOUNTER — Ambulatory Visit (INDEPENDENT_AMBULATORY_CARE_PROVIDER_SITE_OTHER): Payer: Commercial Managed Care - PPO | Admitting: Psychiatry

## 2019-09-30 ENCOUNTER — Encounter: Payer: Self-pay | Admitting: Psychiatry

## 2019-09-30 DIAGNOSIS — F5105 Insomnia due to other mental disorder: Secondary | ICD-10-CM

## 2019-09-30 DIAGNOSIS — F3341 Major depressive disorder, recurrent, in partial remission: Secondary | ICD-10-CM | POA: Diagnosis not present

## 2019-09-30 DIAGNOSIS — F411 Generalized anxiety disorder: Secondary | ICD-10-CM | POA: Diagnosis not present

## 2019-09-30 DIAGNOSIS — F4323 Adjustment disorder with mixed anxiety and depressed mood: Secondary | ICD-10-CM

## 2019-09-30 DIAGNOSIS — F4001 Agoraphobia with panic disorder: Secondary | ICD-10-CM | POA: Diagnosis not present

## 2019-09-30 MED ORDER — CLONAZEPAM 0.5 MG PO TABS: ORAL_TABLET | ORAL | 2 refills | Status: DC

## 2019-09-30 MED ORDER — DOXEPIN HCL 10 MG PO CAPS: 20.0000 mg | ORAL_CAPSULE | Freq: Every day | ORAL | 1 refills | Status: DC
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
## 2019-10-20 ENCOUNTER — Other Ambulatory Visit (HOSPITAL_COMMUNITY): Payer: Self-pay | Admitting: *Deleted

## 2019-10-20 DIAGNOSIS — Z1231 Encounter for screening mammogram for malignant neoplasm of breast: Secondary | ICD-10-CM

## 2019-10-21 ENCOUNTER — Ambulatory Visit: Payer: Commercial Managed Care - PPO

## 2019-11-01 ENCOUNTER — Telehealth (HOSPITAL_COMMUNITY): Payer: Self-pay

## 2019-11-01 NOTE — Telephone Encounter (Signed)
Patient called to cancel [facility] appointment scheduled for 11/04/19 due to scheduling conflict When asked if she needs to reschedule, patient stated "not at this time". Appointment canceled.

## 2019-11-04 ENCOUNTER — Ambulatory Visit (HOSPITAL_COMMUNITY): Payer: Commercial Managed Care - PPO

## 2019-12-01 NOTE — Progress Notes (Signed)
I have reviewed and agreed above plan. 

## 2019-12-29 ENCOUNTER — Telehealth: Payer: Self-pay | Admitting: Psychiatry

## 2019-12-29 NOTE — Telephone Encounter (Signed)
Pt called saying that she no longer has insurance. Would like to reduce the frequency of her visits if possible. She just needs to know when she would need to schedule an appt w/CC.

## 2020-01-05 NOTE — Telephone Encounter (Signed)
If she is doing okay she can wait until the fall to schedule an appointment.

## 2020-01-06 ENCOUNTER — Other Ambulatory Visit: Payer: Self-pay | Admitting: Family Medicine

## 2020-01-07 ENCOUNTER — Other Ambulatory Visit: Payer: Self-pay | Admitting: Neurology

## 2020-01-07 ENCOUNTER — Other Ambulatory Visit: Payer: Self-pay | Admitting: Adult Health

## 2020-01-11 ENCOUNTER — Telehealth: Payer: Self-pay | Admitting: Neurology

## 2020-01-11 ENCOUNTER — Other Ambulatory Visit: Payer: Self-pay | Admitting: *Deleted

## 2020-01-11 ENCOUNTER — Other Ambulatory Visit: Payer: Self-pay | Admitting: Neurology

## 2020-01-11 ENCOUNTER — Telehealth: Payer: Self-pay | Admitting: *Deleted

## 2020-01-11 MED ORDER — TIZANIDINE HCL 4 MG PO TABS: 4.0000 mg | ORAL_TABLET | Freq: Two times a day (BID) | ORAL | 11 refills | Status: DC | PRN

## 2020-01-11 NOTE — Telephone Encounter (Signed)
I understand her concern.   Good news she is fully vaccinated.  At this time we do not know when/if we will need any booster vaccines or what that will look like.   Thus at this time would wait to see what the recommendations regarding future booster will be.   Most of symptoms could be explained by the immune response after vaccination and also depending on when the symptoms starting following the vaccine (ie. Minutes or hours).  The bruising and warmth following the second one seems moreso related to the injection and not necessarily due to any reaction.   Based off of her constellation of symptoms I do not think she needs component testing in the future but we can re-evaluate this at a later date.

## 2020-01-11 NOTE — Telephone Encounter (Signed)
She is doing well on Aimovig 70mg  but is still having issues with migraines. She estimates still having two each week. She is asking if the Aimovig can be increased to 140mg  monthly.   She is currently paying cash for her prescriptions. If MD is in agreement with increase in Aimovig, the prescription will need to be printed for patient assistance rather than sent to the pharmacy.  Tizanidine sent in for #60 per month as discussed with Dr. .  Tramadol refill is in MD box for approval.

## 2020-01-11 NOTE — Telephone Encounter (Signed)
Patient has received both Pfizer vaccines, the second one being on April 6th. She stated that when she received the first vaccine she had a really bad headache, nausea, vomiting, and a sore arm with redness at the injection site. She states that with the second vaccine she had a bad headache, fatigue, her arm was red and purple and hot to the touch that radiated down to her elbow, she stated that these symptoms did not reside until this past Sunday. She denies any history of having COVID. Please advise any future advice for this patient, she was very concerned.

## 2020-01-11 NOTE — Telephone Encounter (Signed)
Called patient and advised. Patient verbalized understanding and will call back with any future questions or concerns.  

## 2020-01-11 NOTE — Telephone Encounter (Signed)
1) Medication(s) Requested (by name): Tramadol tizanidine 2) Pharmacy of Choice: [id number] - [place], [place] - [address]  [address], [place] [address]

## 2020-01-12 ENCOUNTER — Other Ambulatory Visit: Payer: Self-pay | Admitting: *Deleted

## 2020-01-12 MED ORDER — AIMOVIG 140 MG/ML ~~LOC~~ SOAJ: 140.0000 mg | SUBCUTANEOUS | 11 refills | Status: DC

## 2020-01-12 NOTE — Telephone Encounter (Signed)
I have increased aimovig to 140mg  every 30 days, new Rx was [facility]

## 2020-01-12 NOTE — Addendum Note (Signed)
Addended by: Levert Feinstein on: 01/12/2020 08:49 AM   Modules accepted: Orders

## 2020-01-12 NOTE — Telephone Encounter (Signed)
Jessica Molina to [employer] number [phone] .Patient is aware.

## 2020-01-21 ENCOUNTER — Encounter (HOSPITAL_COMMUNITY): Payer: Self-pay

## 2020-01-21 ENCOUNTER — Emergency Department (HOSPITAL_COMMUNITY)
Admission: EM | Admit: 2020-01-21 | Discharge: 2020-01-23 | Disposition: A | Discharge status: Other Acute Inpatient Hospital | Payer: Self-pay | Attending: Emergency Medicine | Admitting: Emergency Medicine

## 2020-01-21 DIAGNOSIS — F332 Major depressive disorder, recurrent severe without psychotic features: Secondary | ICD-10-CM | POA: Insufficient documentation

## 2020-01-21 DIAGNOSIS — T50904A Poisoning by unspecified drugs, medicaments and biological substances, undetermined, initial encounter: Secondary | ICD-10-CM

## 2020-01-21 DIAGNOSIS — Z20822 Contact with and (suspected) exposure to covid-19: Secondary | ICD-10-CM | POA: Insufficient documentation

## 2020-01-21 DIAGNOSIS — I10 Essential (primary) hypertension: Secondary | ICD-10-CM | POA: Insufficient documentation

## 2020-01-21 LAB — CBC
HCT: 44.1 % (ref 36.0–46.0)
Hemoglobin: 15 g/dL (ref 12.0–15.0)
MCH: 31 pg (ref 26.0–34.0)
MCHC: 34 g/dL (ref 30.0–36.0)
MCV: 91.1 fL (ref 80.0–100.0)
Platelets: 362 10*3/uL (ref 150–400)
RBC: 4.84 MIL/uL (ref 3.87–5.11)
RDW: 11.4 % — ABNORMAL LOW (ref 11.5–15.5)
WBC: 13.4 10*3/uL — ABNORMAL HIGH (ref 4.0–10.5)
nRBC: 0 % (ref 0.0–0.2)

## 2020-01-21 LAB — COMPREHENSIVE METABOLIC PANEL
ALT: 16 U/L (ref 0–44)
AST: 19 U/L (ref 15–41)
Albumin: 4 g/dL (ref 3.5–5.0)
Alkaline Phosphatase: 56 U/L (ref 38–126)
Anion gap: 11 (ref 5–15)
BUN: 18 mg/dL (ref 6–20)
CO2: 25 mmol/L (ref 22–32)
Calcium: 9.9 mg/dL (ref 8.9–10.3)
Chloride: 101 mmol/L (ref 98–111)
Creatinine, Ser: 0.78 mg/dL (ref 0.44–1.00)
GFR calc Af Amer: >60 mL/min (ref >60)
GFR calc non Af Amer: >60 mL/min (ref >60)
Glucose, Bld: 143 mg/dL — ABNORMAL HIGH (ref 70–99)
Potassium: 3.8 mmol/L (ref 3.5–5.1)
Sodium: 137 mmol/L (ref 135–145)
Total Bilirubin: 1.3 mg/dL — ABNORMAL HIGH (ref 0.3–1.2)
Total Protein: 6.9 g/dL (ref 6.5–8.1)

## 2020-01-21 LAB — I-STAT BETA HCG BLOOD, ED (MC, WL, AP ONLY): I-stat hCG, quantitative: <5 m[IU]/mL (ref <5)

## 2020-01-21 LAB — ACETAMINOPHEN LEVEL: Acetaminophen (Tylenol), Serum: <10 ug/mL — ABNORMAL LOW (ref 10–30)

## 2020-01-21 LAB — SALICYLATE LEVEL: Salicylate Lvl: <7 mg/dL — ABNORMAL LOW (ref 7.0–30.0)

## 2020-01-21 LAB — ETHANOL: Alcohol, Ethyl (B): <10 mg/dL (ref <10)

## 2020-01-21 MED ORDER — ATROPINE SULFATE 0.4 MG/ML IJ SOLN
0.5000 mg | Freq: Once | INTRAMUSCULAR | Status: DC | PRN
  Filled 2020-01-21: qty 1.25

## 2020-01-21 MED ORDER — SODIUM CHLORIDE 0.9 % IV BOLUS
1000.0000 mL | Freq: Once | INTRAVENOUS | Status: AC
  Administered 2020-01-21: 1000 mL via INTRAVENOUS

## 2020-01-21 MED ORDER — ATROPINE SULFATE 1 MG/10ML IJ SOSY
0.5000 mg | PREFILLED_SYRINGE | Freq: Once | INTRAMUSCULAR | Status: AC | PRN
  Administered 2020-01-22: 03:00:00 0.5 mg via INTRAVENOUS
  Filled 2020-01-21: qty 10

## 2020-01-21 NOTE — ED Triage Notes (Signed)
Pt comes via [facility] for suspected overdose of tizanidine 4 mg and clonazepam 0.5 mg pt brady in the 30's, responsive to painful stimuli, confused, PTA given 0.5 atropine, pt denies SI.

## 2020-01-21 NOTE — ED Provider Notes (Signed)
[facility] Emergency Department Provider Note MRN:  [id number]  Arrival date & time: 01/21/20     Chief Complaint   Drug Overdose   History of Present Illness   Jessica Molina is a 44 y.o. year-old female with a history of GERD presenting to the ED with chief complaint of drug overdose.  Patient reportedly overdosed on clonazepam.  She admits to taking 4 tablets of the clonazepam this afternoon to "feel alive".  She admits to some depression but denies SI, no HI, no AVH.  She denies any pain.  She denies any illicit drugs, no alcohol.  I was unable to obtain an accurate HPI, PMH, or ROS due to the patient's altered mental status.  Level 5 caveat.  Review of Systems  Positive for drug overdose, bradycardia.  Patient's Health History    Past Medical History:  Diagnosis Date  . 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 Anxiety   . GERD (gastroesophageal reflux disease)   . Hypertension   . Migraine   . PONV (postoperative nausea and vomiting)    hard to wake up after appendectomy  . Renal vein stenosis    "Kinked"    Past Surgical History:  Procedure Laterality Date  . ABDOMINAL HYSTERECTOMY    . ANEURYSM SURGERY  01/2015  . APPENDECTOMY    . BTL  2006  . CESAREAN SECTION     Twins,  BTL  . COLONOSCOPY     Over 20 years in [place]   . CYSTOSCOPY N/A 06/25/2016   Procedure: CYSTOSCOPY;  Surgeon: Dara Lords, MD;  Location: [facility];  Service: Gynecology;  Laterality: N/A;  . LAPAROSCOPIC VAGINAL HYSTERECTOMY WITH SALPINGECTOMY Bilateral 06/25/2016   Procedure: LAPAROSCOPIC ASSISTED VAGINAL HYSTERECTOMY WITH Bilateral SALPINGECTOMY, Lysis of Adhesions;  Surgeon: Dara Lords, MD;  Location: [facility];  Service: Gynecology;  Laterality: Bilateral;  . RENAL ANGIOPLASTY     x2    Family History  Problem Relation Age of Onset  . Breast cancer Mother        breast   . Thyroid disease  Mother   . Cancer Mother 43       breast  . Hyperlipidemia Mother   . Hypertension Father   . Stroke Father   . Melanoma Father        melanoma it is terminal   . Cancer Father        melanoma  . Breast cancer Maternal Grandmother        breast   . Thyroid disease Maternal Grandmother   . Cancer Maternal Grandfather        unknown   . Cancer Paternal Grandmother        unknow   . Colon cancer Neg Hx   . Esophageal cancer Neg Hx   . Esophageal varices Neg Hx   . Stomach cancer Neg Hx     Social History   Socioeconomic History  . Marital status: Married    Spouse name: Not on file  . Number of children: 4  . Years of education: HS+  . Highest education level: Not on file  Occupational History  . Occupation: Psychologist, sport and exercise  Tobacco Use  . Smoking status: Never Smoker  . Smokeless tobacco: Never Used  Substance and Sexual Activity  . Alcohol use: Yes    Alcohol/week: 0.0 standard drinks    Comment: 2-3 drinks per week  . Drug use:  No  . Sexual activity: Yes    Partners: Male    Birth control/protection: Surgical  Other Topics Concern  . Not on file  Social History Narrative   Lives at home with husband and children.   Right-handed.   2-3 cups caffeine per week.   Social Determinants of Health   Financial Resource Strain:   . Difficulty of Paying Living Expenses:   Food Insecurity:   . Worried About Programme researcher, broadcasting/film/video in the Last Year:   . Barista in the Last Year:   Transportation Needs:   . Freight forwarder (Medical):   Marland Kitchen Lack of Transportation (Non-Medical):   Physical Activity:   . Days of Exercise per Week:   . Minutes of Exercise per Session:   Stress:   . Feeling of Stress :   Social Connections:   . Frequency of Communication with Friends and Family:   . Frequency of Social Gatherings with Friends and Family:   . Attends Religious Services:   . Active Member of Clubs or Organizations:   . Attends Banker Meetings:   Marland Kitchen  Marital Status:   Intimate Partner Violence:   . Fear of Current or Ex-Partner:   . Emotionally Abused:   Marland Kitchen Physically Abused:   . Sexually Abused:      Physical Exam   Vitals:   01/21/20 2231 01/21/20 2232  BP:    Pulse:    Resp:    Temp: (!) 96 F (35.6 C)   SpO2:  100%    CONSTITUTIONAL: Chronically ill-appearing, NAD NEURO: Somnolent, wakes to voice, responds to questions, follows command, moves all extremities EYES:  eyes equal and reactive ENT/NECK:  no LAD, no JVD CARDIO: Bradycardic rate, well-perfused, normal S1 and S2 PULM:  CTAB no wheezing or rhonchi GI/GU:  normal bowel sounds, non-distended, non-tender MSK/SPINE:  No gross deformities, no edema SKIN:  no rash, atraumatic PSYCH:  Appropriate speech and behavior  *Additional and/or pertinent findings included in MDM below  Diagnostic and Interventional Summary    EKG Interpretation  Date/Time:  Friday January 21 2020 22:08:44 EDT Ventricular Rate:  33 PR Interval:    QRS Duration: 108 QT Interval:  569 QTC Calculation: 422 R Axis:   44 Text Interpretation: Sinus bradycardia Confirmed by Kennis Carina [id number]) on 01/21/2020 10:19:25 PM      Labs Reviewed  COMPREHENSIVE METABOLIC PANEL - Abnormal; Notable for the following components:      Result Value   Glucose, Bld 143 (*)    Total Bilirubin 1.3 (*)    All other components within normal limits  SALICYLATE LEVEL - Abnormal; Notable for the following components:   Salicylate Lvl <7.0 (*)    All other components within normal limits  ACETAMINOPHEN LEVEL - Abnormal; Notable for the following components:   Acetaminophen (Tylenol), Serum <10 (*)    All other components within normal limits  CBC - Abnormal; Notable for the following components:   WBC 13.4 (*)    RDW 11.4 (*)    All other components within normal limits  ETHANOL  RAPID URINE DRUG SCREEN, HOSP PERFORMED  I-STAT BETA HCG BLOOD, ED ([facility], [facility], [facility] ONLY)    No orders to display      Medications  atropine 1 MG/10ML injection 0.5 mg (has no administration in time range)  sodium chloride 0.9 % bolus 1,000 mL (1,000 mLs Intravenous New Bag/Given 01/21/20 2237)     Procedures  /  Critical Care  Procedures  ED Course and Medical Decision Making  I have reviewed the triage vital signs, the nursing notes, and pertinent available records from the EMR.  Listed above are laboratory and imaging tests that I personally ordered, reviewed, and interpreted and then considered in my medical decision making (see below for details).      Benzodiazepine and tizanidine overdose, other than somnolence no significant toxidrome-like features.  Patient is bradycardic but well-perfused.  EKG with sinus rhythm, intervals without concerns.  Concern for possible mixed ingestion, though patient denies any intention of self-harm.  Awaiting labs, will monitor closely.  Signed out to oncoming provider at shift change.  Given the unclear intent and the bradycardia which raises concern for mixed overdose, will fill out IVC paperwork and consult TTS.    Barth Kirks. Sedonia Small, [place] [email or web address]  Final Clinical Impressions(s) / ED Diagnoses     ICD-10-CM   1. Drug overdose, undetermined intent, initial encounter  [place]     ED Discharge Orders    None       Discharge Instructions Discussed with and Provided to Patient:   Discharge Instructions   None       Maudie Flakes, MD 01/21/20 [phone]

## 2020-01-22 ENCOUNTER — Emergency Department (HOSPITAL_COMMUNITY): Payer: Self-pay

## 2020-01-22 LAB — RAPID URINE DRUG SCREEN, HOSP PERFORMED
Amphetamines: NOT DETECTED
Barbiturates: NOT DETECTED
Benzodiazepines: POSITIVE — AB
Cocaine: NOT DETECTED
Opiates: NOT DETECTED
Tetrahydrocannabinol: NOT DETECTED

## 2020-01-22 LAB — RESPIRATORY PANEL BY RT PCR (FLU A&B, COVID)
Influenza A by PCR: NEGATIVE
Influenza B by PCR: NEGATIVE
SARS Coronavirus 2 by RT PCR: NEGATIVE

## 2020-01-22 MED ORDER — ADULT MULTIVITAMIN W/MINERALS CH
1.0000 | ORAL_TABLET | Freq: Every day | ORAL | Status: DC
  Administered 2020-01-22: 1 via ORAL
  Filled 2020-01-22 (×2): qty 1

## 2020-01-22 MED ORDER — DOXEPIN HCL 10 MG PO CAPS
20.0000 mg | ORAL_CAPSULE | Freq: Every day | ORAL | Status: DC
  Administered 2020-01-22: 23:00:00 20 mg via ORAL
  Filled 2020-01-22: qty 2

## 2020-01-22 MED ORDER — SODIUM CHLORIDE 0.9 % IV BOLUS
1000.0000 mL | Freq: Once | INTRAVENOUS | Status: AC
  Administered 2020-01-22: 12:00:00 1000 mL via INTRAVENOUS

## 2020-01-22 MED ORDER — ALUM & MAG HYDROXIDE-SIMETH 200-200-20 MG/5ML PO SUSP: 30.0000 mL | Freq: Four times a day (QID) | ORAL | Status: DC | PRN

## 2020-01-22 MED ORDER — ONDANSETRON HCL 4 MG PO TABS: 4.0000 mg | ORAL_TABLET | Freq: Three times a day (TID) | ORAL | Status: DC | PRN

## 2020-01-22 MED ORDER — CLONAZEPAM 0.5 MG PO TABS
0.5000 mg | ORAL_TABLET | Freq: Two times a day (BID) | ORAL | Status: DC
  Administered 2020-01-22 – 2020-01-23 (×2): 0.5 mg via ORAL
  Filled 2020-01-22 (×2): qty 1

## 2020-01-22 MED ORDER — HYDROCHLOROTHIAZIDE 25 MG PO TABS
25.0000 mg | ORAL_TABLET | Freq: Every day | ORAL | Status: DC
  Administered 2020-01-23: 10:00:00 25 mg via ORAL
  Filled 2020-01-22: qty 1

## 2020-01-22 MED ORDER — IBUPROFEN 400 MG PO TABS: 600.0000 mg | ORAL_TABLET | Freq: Three times a day (TID) | ORAL | Status: DC | PRN

## 2020-01-22 NOTE — ED Notes (Signed)
One emesis occurrence

## 2020-01-22 NOTE — ED Notes (Signed)
Patient's husband continues to call ED to speak to RN and MD.  Spouse continues to cuss and phone harassing staff, RN and MD.  Spouse continues to call back to back, unreasonable on the phone.

## 2020-01-22 NOTE — ED Provider Notes (Signed)
Physical Exam  BP 98/64   Pulse (!) 54   Temp (!) 96 F (35.6 C) (Rectal) Comment (Src): bair hugger applied   Resp 13   LMP 06/19/2016 (Exact Date)   SpO2 100%   Physical Exam  ED Course/Procedures   Clinical Course as of Jan 21 2153  Sat Jan 22, 2020  0700 Pt signed out to me by Dr Blinda Leatherwood.  Briefly this is a 44 yo female found unresponsive with suspected drug overdose.  Transiently became bradycardic here requiring atropine.  Initially was quite altered, but now alert and tearful.  HR has improved to 50's.  Pending UDS, improved mental status, patient under IVC pending [facility] evaluation    [MT]  0727 On my assessment this morning patient is awake, HR 38, BP stable, she is emotionalyl labile and sobbing.  She continues having very slurred speech and is difficult to understand.  I cannot understand which pills she took yesterday.  Nursing reports that from EMS she had 4 klonopin pills missing from her bottle, and perhaps 80-90 pills of zanaflex missing from her more recent bottle, and that the patient had also reported using trazadone.  She is going to CT now to rule out other potential causes of AMS.  I suspect she may need continued monitoring for improvement of her MS and HR.  PC was contacted last night and reported zanaflex can cause bradycardia   [MT]  0731 Nursing and overnight EDP report the patient's husband was present last night, extremely agitated and aggressive with staff, and had to be escorted out of the building by security.     [MT]  7902  IMPRESSION: 1. No acute intracranial abnormalities. The appearance of the brain is normal   [MT]  1018 Repeat ecg with no significant changes, no widening of QT interval or QRS interval, no heart block.  I re-discussed the case with poison control who feels that the polypharmacy may be causing prolonged effects from these medications.  We agreed to monitor until approx noon (14 hours total), and if she is still bradycardic she will need  medical admission   [MT]  1347 HR is now 65, BP stable.  Patient was able to ambulate to the bathroom.  I believe she is now medically cleared for [facility] evaluation.   [MT]  1352 Home medications ordered.  She will need to continue clonazepam at a small dose beginning tonight to prevent benzo withdrawal, 0.5 mg Bid ordered   [MT]    Clinical Course User Index [MT] Trifan, Kermit Balo, MD    Procedures  MDM  I was called by nursing because her heart rate is persistently in the 30s to 40. Patient states that her heart rate usually run low 40s to upper 30s. She states that she does not feel dizzy or lightheaded. She denies any chest pain or shortness of breath.  She states that her blood pressure also ran in the upper 90s to 100.  Poison control was contacted previous provider and patient is more than 12 hours after her overdose and does not think it is from the overdose.  Patient was medically cleared by previous provider and I was asked to assess her again.  Patient looks well and is maintaining well. Since her baseline heart rate is in the low 40s, and she sometimes goes from mid 30s to 50, I think she remains medically cleared.  She had multiple EKGs that showed no heart block.  At this point, she does not need  medical admission.      Drenda Freeze, MD 01/22/20 2157

## 2020-01-22 NOTE — ED Notes (Signed)
Pt husband found in room with pt, husband updated on plan of care and updated on policy, pt very upset and states that his wife is not being cared for and needs 2-3 more bags of IV fluids due to severe dehydration. Husband upset that he is unable to stay with pt and became hostile with staff, recording staff on phone, pt given a copy a policy and escorted out by security.

## 2020-01-22 NOTE — ED Notes (Signed)
Patient has been IVC'd--Jessica Molina 

## 2020-01-22 NOTE — ED Notes (Signed)
-  Patient is now awake. -She is verbally abusive to the staff -Patient states she wants to leave AMA, -this RN explains to patient that she IVC'd -She states "this is not what they did at [facility] and in [place]." -Patient requests to call her husband -She is given the phone to call spouse. -PT is on the phone with spouse this moment

## 2020-01-22 NOTE — BH Assessment (Signed)
[facility] Assessment Progress Note   Dr Renaye Rakers notified by [facility] that inpatient treatment is recommended for this patient.

## 2020-01-22 NOTE — ED Notes (Signed)
Pt requests that Jessica Molina be contacted in the AM regarding pt dispo. Pt states Jessica Molina is her boss, needs an update because she works helping to administer vaccines for [employer]

## 2020-01-22 NOTE — Progress Notes (Signed)
Per Lyla Son with admissions pt has tentatively been accepted to [facility] pending a negative COVID PCR which will need to be faxed to [phone].   Ruthann Cancer MSW, LCSWA Clincal Social Worker Disposition  [facility] Ph: [phone] Fax: [phone]

## 2020-01-22 NOTE — ED Notes (Signed)
Pt contact/husband, Noah, updated on pt status, vital signs, dispo & care plan. Noah expressed gratitude for thorough update, expressed concern for pt's boss & need to update for job. Staff advised that pt utilized 2 phone calls today (4/24), will need to make phone call to boss tomorrow (4/25). Pt & husband expressed understanding of this rule.

## 2020-01-22 NOTE — Progress Notes (Signed)
Patient meets criteria for inpatient treatment per Nanine Means, NP. No appropriate beds at [facility] currently. CSW faxed referrals to the following facilities for review:  [facility]  [facility]   [facility]  [facility]  [facility]  [facility]  [facility]  [facility]   [facility]  [facility]  [facility]  [facility]   TTS will continue to seek bed placement.     Ruthann Cancer MSW, [facility] Clincal Social Worker Disposition  [facility] Ph: [phone] Fax: [phone] 01/22/2020 3:45 PM

## 2020-01-22 NOTE — ED Notes (Signed)
[facility] doctor called for update [phone]

## 2020-01-22 NOTE — ED Notes (Signed)
Poison control updated on pt condition, vitals, dispo

## 2020-01-22 NOTE — ED Notes (Signed)
Pt husband Anette Riedel [phone]

## 2020-01-22 NOTE — ED Notes (Signed)
Pt now more awake, states that she wants to go home, pt states that she took extra medication to "feel good" and to sleep but denies SI. Pt states that she also drank 3 glasses of wine.

## 2020-01-22 NOTE — ED Notes (Signed)
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
## 2020-01-22 NOTE — BH Assessment (Signed)
Clinician spoke to pt's nurse, Aundra Millet RN, re: pt being tentatively accepted to [facility] pending a negative COVID test. Clinician inquired about a COVID test being done on pt so clinician could fax it to [facility] to complete the hospital's request for admission. Pt's nurse stated she will be doing pt's COVID test in a short while and will update pt's husband on the plan of care.

## 2020-01-22 NOTE — ED Notes (Signed)
Medications taken to pharmacy and belongings placed in locker 4

## 2020-01-22 NOTE — ED Notes (Signed)
Pt HR dropped to 27

## 2020-01-23 ENCOUNTER — Other Ambulatory Visit: Payer: Self-pay

## 2020-01-23 DIAGNOSIS — F332 Major depressive disorder, recurrent severe without psychotic features: Secondary | ICD-10-CM | POA: Diagnosis present

## 2020-01-23 NOTE — ED Notes (Signed)
Faxed COVID-19 results to [facility].

## 2020-01-23 NOTE — Progress Notes (Signed)
Pt accepted to [facility] Dr. Angela Burke is the accepting provider.  Call report to [phone] Kriste Basque, RN @ [facility] ED notified.   Pt is IVC  Pt may be transported by [facility].  Pt scheduled to arrive as soon as transport has been set up.   Ruthann Cancer MSW, [facility] Social Worker Disposition  [facility] Ph: [phone] Fax: [phone]  01/23/2020 8:09 AM

## 2020-01-23 NOTE — ED Notes (Addendum)
Deputy called and advised will be here to transport pt in approx 1 hour. ALL Belongings - 1 labeled belongings bag and Home Meds (No Valuables noted - Verified w/Security) - Deputy - Pt aware. Pt advised she is becoming anxious and requesting Klonopin.

## 2020-01-23 NOTE — ED Notes (Signed)
Pt has been accepted to [facility] - Pt aware - Left message for [facility] Deputy notifying of need for transport.

## 2020-01-23 NOTE — ED Notes (Signed)
Pt requests that Amedeo Kinsman be contacted for first call of day [phone] [email or web address]

## 2020-01-23 NOTE — ED Provider Notes (Signed)
Pt has been accepted at [facility] by Dr. Synetta Fail.  She is reexamined and remains stable for transport.   Jacalyn Lefevre, MD 01/23/20 1027

## 2020-01-23 NOTE — ED Notes (Signed)
Pt changed into blue paper scrubs prior to arriving to [facility] - ambulatory. Sitter w/pt. Pt requested to call her boss. Pt attempted to place call - advised wrong number. Pt requesting to obtain her phone to get the number. Advised pt as previous shift RN advised - if her phone is here, it is locked w/Security and will be transported w/her to [employer]. Pt voiced understanding. Pt stated she works for [employer]. Offered for pt to contact Staffing Office - pt declined - stating she was directly hired by her boss - not through the [employer].

## 2020-01-24 ENCOUNTER — Telehealth: Payer: Self-pay | Admitting: Psychiatry

## 2020-01-24 NOTE — Telephone Encounter (Signed)
Patient called and said that she is in a behavioral health facility in [place] and they want to take her off alll her medicnes and she doesn't want to do that. She has made an appt for the end of may she will be in the hospital

## 2020-01-28 NOTE — Telephone Encounter (Signed)
Pt in Behavioral Health in [place] and they have taken her off all her meds. She wants to know if someone will call and talk to them. They will not release her unless she takes the meds that they have prescribed. Lamictal 50mg  has to get up to 150mg  to go home. Seroquel at hs 150mg , [phone].

## 2020-01-28 NOTE — Telephone Encounter (Signed)
Here is another number that pt can be reached with  [phone]  Use code [phone]

## 2020-02-01 ENCOUNTER — Telehealth: Payer: Self-pay | Admitting: Psychiatry

## 2020-02-01 NOTE — Telephone Encounter (Signed)
She needs to get a letter from the hospital since they are the last ones that saw her and I have not seen her.  Put her on the cancellation list for me.

## 2020-02-01 NOTE — Telephone Encounter (Signed)
Pt needs a letter stating if she is safe enough to stay in the home with her children. She was also just discharged form the hospital. Pt requesting a call back from nurse. ([employer]) Call back # [phone]

## 2020-02-01 NOTE — Telephone Encounter (Signed)
Tammy has let her know that. Done!

## 2020-02-21 ENCOUNTER — Other Ambulatory Visit: Payer: Self-pay

## 2020-02-21 ENCOUNTER — Other Ambulatory Visit: Payer: Self-pay | Admitting: Psychiatry

## 2020-02-21 ENCOUNTER — Ambulatory Visit (INDEPENDENT_AMBULATORY_CARE_PROVIDER_SITE_OTHER): Payer: Self-pay | Admitting: Psychiatry

## 2020-02-21 ENCOUNTER — Encounter: Payer: Self-pay | Admitting: Psychiatry

## 2020-02-21 DIAGNOSIS — F3341 Major depressive disorder, recurrent, in partial remission: Secondary | ICD-10-CM

## 2020-02-21 DIAGNOSIS — F5105 Insomnia due to other mental disorder: Secondary | ICD-10-CM

## 2020-02-21 DIAGNOSIS — F411 Generalized anxiety disorder: Secondary | ICD-10-CM

## 2020-02-21 DIAGNOSIS — F4001 Agoraphobia with panic disorder: Secondary | ICD-10-CM

## 2020-02-21 MED ORDER — BUPROPION HCL ER (XL) 300 MG PO TB24: 300.0000 mg | ORAL_TABLET | Freq: Every day | ORAL | 1 refills | Status: DC

## 2020-02-21 MED ORDER — CLONAZEPAM 0.5 MG PO TABS: 0.5000 mg | ORAL_TABLET | Freq: Three times a day (TID) | ORAL | 1 refills | Status: DC | PRN
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
## 2020-03-02 ENCOUNTER — Ambulatory Visit: Payer: Self-pay | Admitting: Adult Health

## 2020-03-21 ENCOUNTER — Other Ambulatory Visit: Payer: Self-pay | Admitting: Psychiatry

## 2020-03-21 DIAGNOSIS — F3341 Major depressive disorder, recurrent, in partial remission: Secondary | ICD-10-CM

## 2020-04-24 ENCOUNTER — Ambulatory Visit (INDEPENDENT_AMBULATORY_CARE_PROVIDER_SITE_OTHER): Payer: Self-pay | Admitting: Psychiatry

## 2020-04-24 ENCOUNTER — Encounter: Payer: Self-pay | Admitting: Psychiatry

## 2020-04-24 ENCOUNTER — Other Ambulatory Visit: Payer: Self-pay

## 2020-04-24 DIAGNOSIS — F5105 Insomnia due to other mental disorder: Secondary | ICD-10-CM

## 2020-04-24 DIAGNOSIS — F4001 Agoraphobia with panic disorder: Secondary | ICD-10-CM

## 2020-04-24 DIAGNOSIS — F3341 Major depressive disorder, recurrent, in partial remission: Secondary | ICD-10-CM

## 2020-04-24 DIAGNOSIS — F411 Generalized anxiety disorder: Secondary | ICD-10-CM

## 2020-04-24 MED ORDER — DOXEPIN HCL 10 MG PO CAPS: 20.0000 mg | ORAL_CAPSULE | Freq: Every day | ORAL | 1 refills | Status: DC

## 2020-04-24 MED ORDER — BUPROPION HCL ER (XL) 300 MG PO TB24: 300.0000 mg | ORAL_TABLET | Freq: Every day | ORAL | 1 refills | Status: DC

## 2020-04-24 MED ORDER — CLONAZEPAM 0.5 MG PO TABS: 0.5000 mg | ORAL_TABLET | Freq: Three times a day (TID) | ORAL | 3 refills | Status: DC | PRN
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
## 2020-06-21 ENCOUNTER — Other Ambulatory Visit: Payer: Self-pay | Admitting: Family Medicine

## 2020-06-21 ENCOUNTER — Other Ambulatory Visit: Payer: Self-pay | Admitting: Neurology

## 2020-06-21 ENCOUNTER — Encounter: Payer: Self-pay | Admitting: Adult Health

## 2020-06-21 MED ORDER — HYDROCHLOROTHIAZIDE 25 MG PO TABS: 25.0000 mg | ORAL_TABLET | Freq: Every day | ORAL | 0 refills | Status: DC

## 2020-06-21 MED ORDER — TRAMADOL HCL 50 MG PO TABS: 50.0000 mg | ORAL_TABLET | Freq: Two times a day (BID) | ORAL | 0 refills | Status: DC | PRN

## 2020-06-21 MED ORDER — LOSARTAN POTASSIUM 100 MG PO TABS: 100.0000 mg | ORAL_TABLET | Freq: Every day | ORAL | 0 refills | Status: DC

## 2020-06-21 NOTE — Telephone Encounter (Signed)
I called [employer] aimovig 140mg /ml  delivered at pts front door 05-04-20 3 pens (90 day supply). Via tracking, clarified address.  Nees tramadol refill.  Has refills of tizanidine at [facility] in [place].

## 2020-07-15 ENCOUNTER — Other Ambulatory Visit: Payer: Self-pay | Admitting: Family Medicine

## 2020-07-18 IMAGING — MG DIGITAL SCREENING BILATERAL MAMMOGRAM WITH TOMO AND CAD
8 series · 8 of 24 positions shown · non-contrast
Comparison: Previous exam(s).

CLINICAL DATA: Screening.

EXAM:
DIGITAL SCREENING BILATERAL MAMMOGRAM WITH TOMO AND CAD

[R MLO synth-2D]
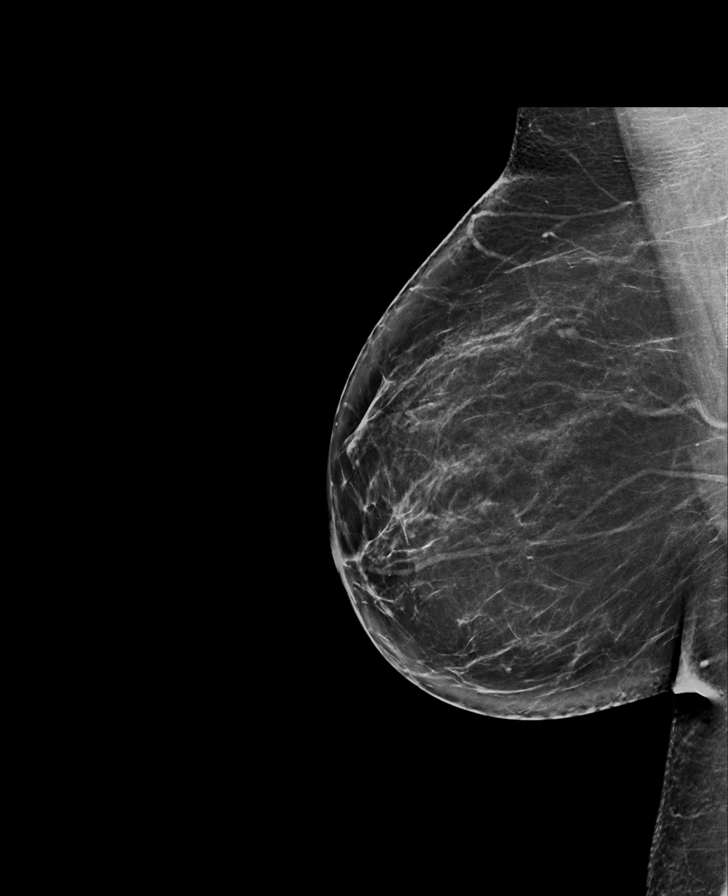

[L MLO synth-2D]
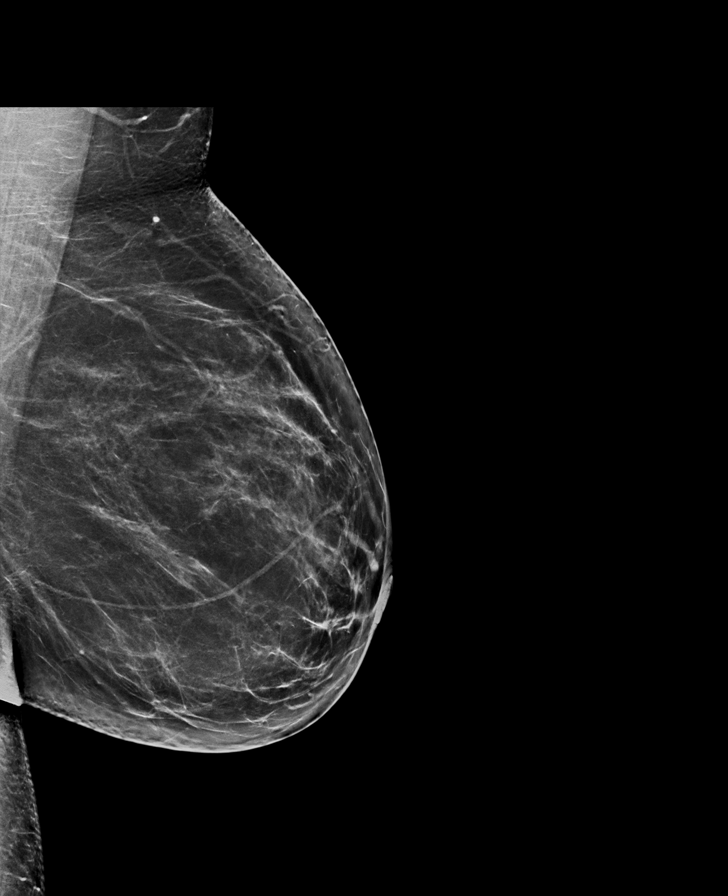

[R CC synth-2D]
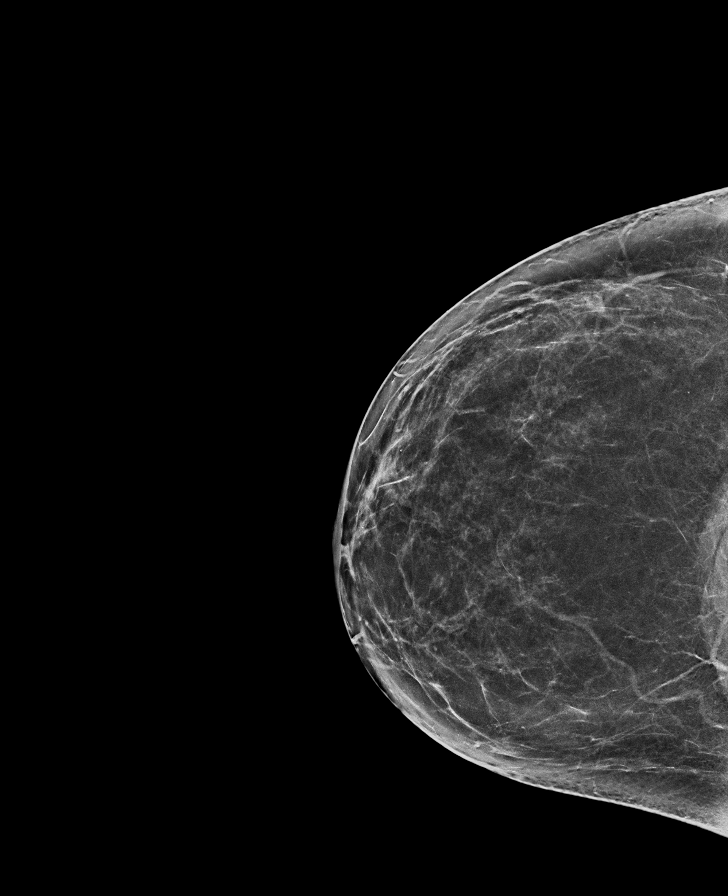

[L CC synth-2D]
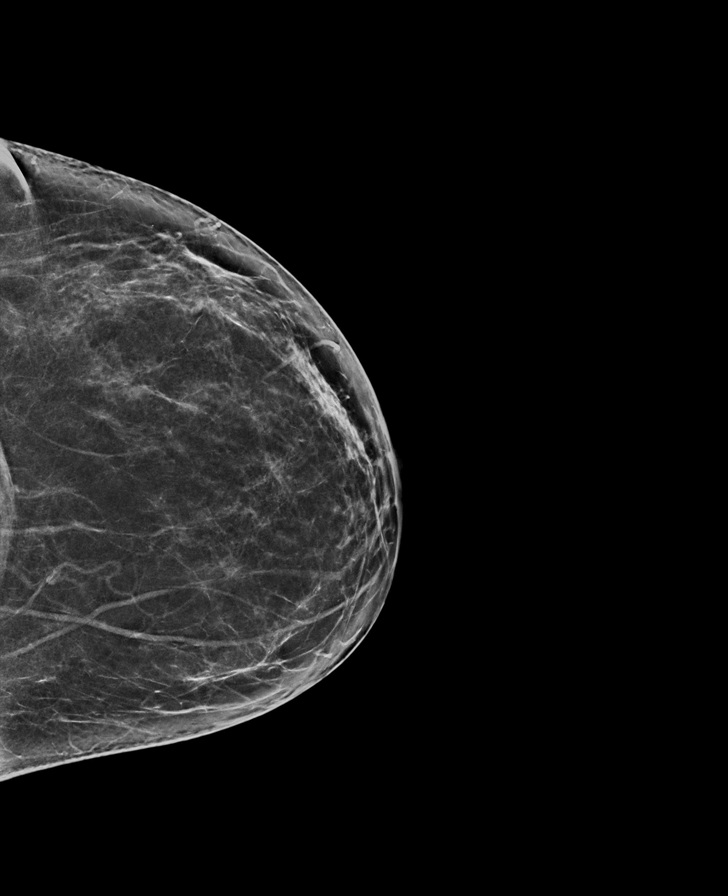

[R CC tomo · tomo slice 35/69.0]
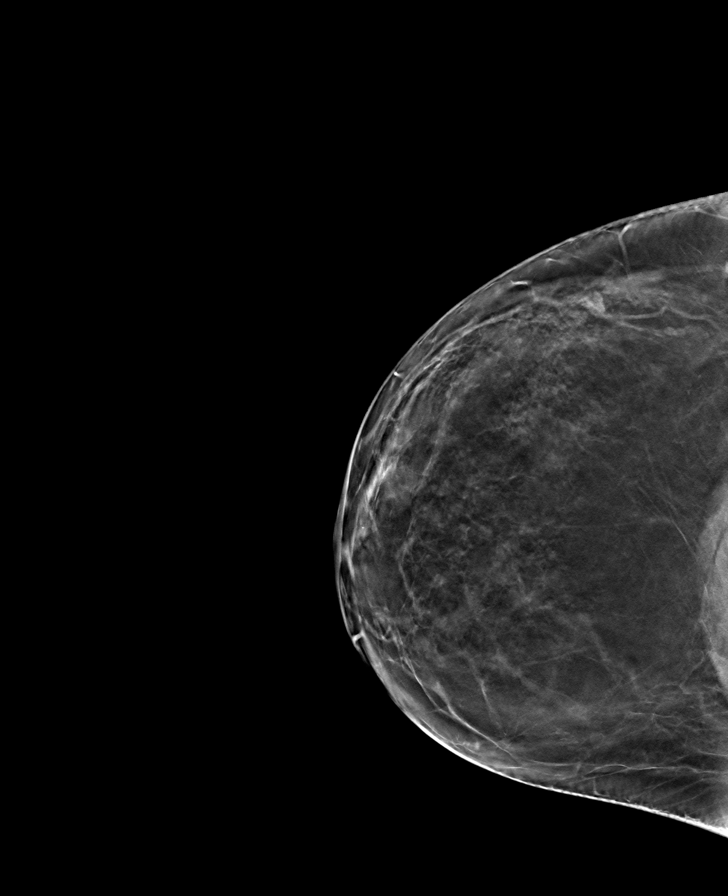

[L MLO tomo · tomo slice 39/78.0]
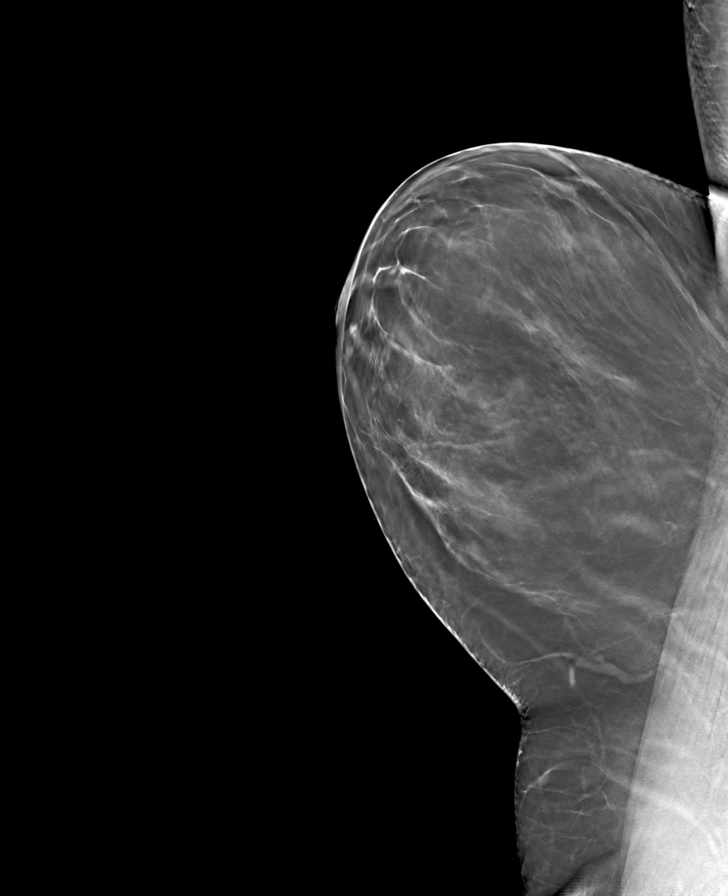

[L CC tomo · tomo slice 35/70.0]
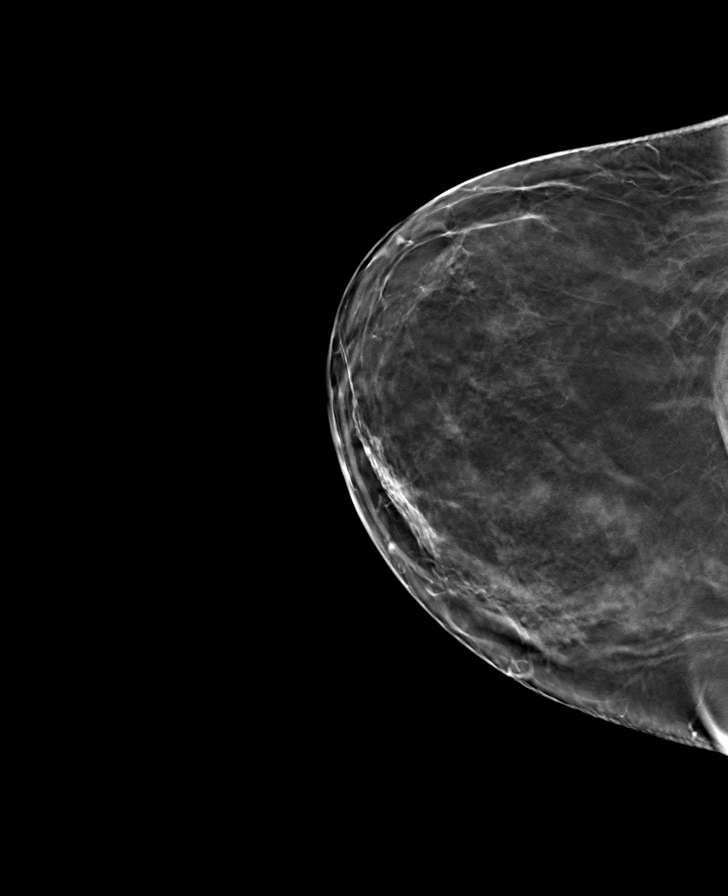

[R MLO tomo · tomo slice 39/78.0]
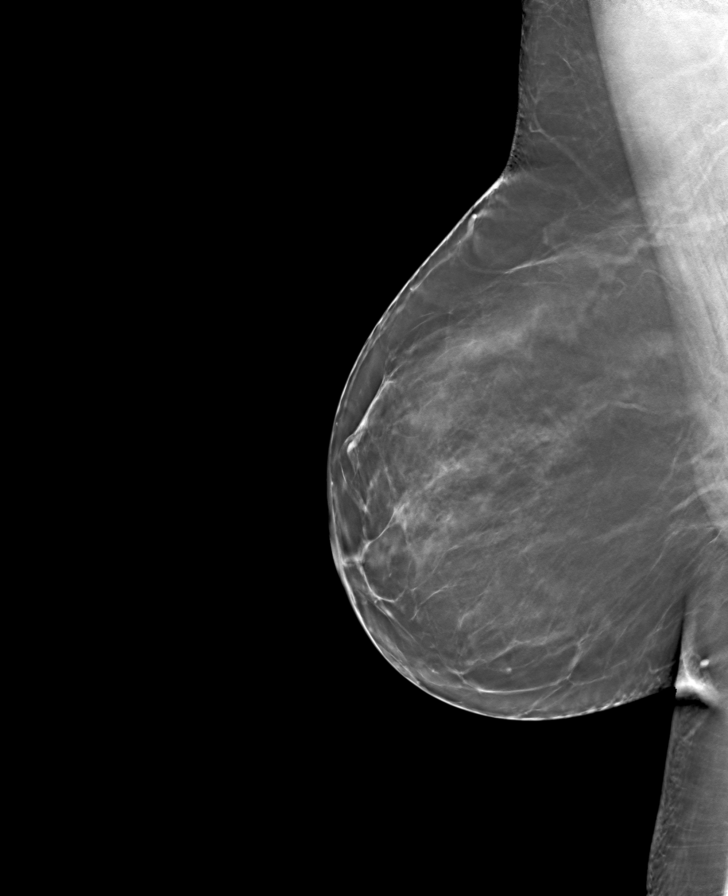

[8 of 24 positions shown; findings below may reference images not displayed]

ACR Breast Density Category b: There are scattered areas of
fibroglandular density.
FINDINGS: There are no findings suspicious for malignancy. Images were
processed with CAD.
IMPRESSION: No mammographic evidence of malignancy. A result letter of this
screening mammogram will be mailed directly to the patient.

RECOMMENDATION:
Screening mammogram in one year. (Code:[id number])

BI-RADS CATEGORY  1: Negative.

## 2020-07-31 ENCOUNTER — Other Ambulatory Visit: Payer: Self-pay | Admitting: Neurology

## 2020-08-02 ENCOUNTER — Ambulatory Visit: Payer: Medicaid Other | Admitting: Adult Health

## 2020-08-02 NOTE — Progress Notes (Deleted)
Jessica Molina: Jessica Molina DOB: 1976/04/13 [facility] provider: Dr. Terrace Arabia   No chief complaint on file.    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  She continues to be followed in this office by Dr. Terrace Arabia.  Migraine headaches have been present since 2015 occasionally with visual aura.   Today, 08/02/2020, Jessica Molina returns for migraine management.  Previously seen 09/01/2019 and has not followed up since this time due to lack of insurance.  At prior visit, initiated Aimovig which she has continued on with benefit of migraine headaches.  She also remains on tizanidine and tramadol.  Currently, she experiences *** migraine days per month.  She continues to follow with behavioral health for depression and anxiety.    History provided for reference purposes only Update 09/01/2019 [name] is a 44 year old female who is being seen today for migraine follow-up.  She has continued to experience 1-2 migraines per week which are debilitating where she will have to lay down and they will typically be resolved by the next day.  She will rotate tramadol and tizanidine as needed.  She has self discontinued Ajovy due to limited benefit and has not noticed any worsening since discontinuing.  She continues to follow with behavioral health for depression and anxiety.  She does endorse her husband losing his job due to COVID-19 and will be losing insurance coverage.  No further concerns at this time.  Update 08/04/2018 [name] is being seen today for migraine follow-up and medication management.  After prior visit 1 year ago, Jessica Molina was started on Ajovy along with zonisamide for better migraine management.  She has continued taking the Ajovy but has stopped the zonisamide as she states she was not seeing benefit of  continuing. She does state that her migraines have improved with 1-2 migraines per week which last approx. 8-12 hours which are associated with photophobia, phonophobia, and N/V.  She does state that the headache duration and intensity has greatly improved.  When migraine onset, she will take the tizanidine and if she continues to have a migraine hours after use of tizanidine, she will take tramadol which will usually break her migraine.  She does state that she last filled tramadol on 07/20/2018 but while cleaning, the pills accidentally fell into the toilet.  Review of PMP shows recent refills on 03/25/2018, 06/09/2018 and 07/20/2018.  She does endorse being under a great deal of stress at this time due to family medical issues.  No further concerns at this time.      REVIEW OF SYSTEMS: Full 14 system review of systems performed and notable only for migraines  ALLERGIES: Allergies  Allergen Reactions  . Lisinopril Cough  . Metoprolol Other (See Comments)    did not feel well when taking     Allergies as of 08/02/2020      Reactions   Acetaminophen Itching   Lisinopril Cough, Other (See Comments)   Makes blood pressure really low    Metoprolol Other (See Comments)   did not feel well when taking       Medication List       Accurate as of August 02, 2020  7:04 AM. If you have any questions, ask your nurse or doctor.        Aimovig 140 MG/ML Soaj Generic  drug: Erenumab-aooe Inject 140 mg into the skin every 30 (thirty) days.   buPROPion 300 MG 24 hr tablet Commonly known as: WELLBUTRIN XL Take 1 tablet (300 mg total) by mouth daily.   clonazePAM 0.5 MG tablet Commonly known as: KLONOPIN Take 1 tablet (0.5 mg total) by mouth 3 (three) times daily as needed for anxiety.   doxepin 10 MG capsule Commonly known as: SINEQUAN Take 2 capsules (20 mg total) by mouth at bedtime.   fluticasone 50 MCG/ACT nasal spray Commonly known as: FLONASE Place 2 sprays into both nostrils  daily. What changed:   when to take this  reasons to take this   hydrochlorothiazide 25 MG tablet Commonly known as: HYDRODIURIL TAKE 1 TABLET BY MOUTH EVERY DAY   losartan 100 MG tablet Commonly known as: COZAAR TAKE 1 TABLET BY MOUTH EVERY DAY   multivitamin with minerals Tabs tablet Take 1 tablet by mouth daily.   tiZANidine 4 MG tablet Commonly known as: Zanaflex Take 1 tablet (4 mg total) by mouth 2 (two) times daily as needed for muscle spasms.   traMADol 50 MG tablet Commonly known as: ULTRAM Take 1 tablet (50 mg total) by mouth every 12 (twelve) hours as needed for moderate pain.        PAST MEDICAL HISTORY: Past Medical History:  Diagnosis Date  . 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 Anxiety   . GERD (gastroesophageal reflux disease)   . Hypertension   . Migraine   . PONV (postoperative nausea and vomiting)    hard to wake up after appendectomy  . Renal vein stenosis    "Kinked"    FAMILY HISTORY: Family History  Problem Relation Age of Onset  . Breast cancer Mother        breast   . Thyroid disease Mother   . Cancer Mother [id number]       breast  . Hyperlipidemia Mother   . Hypertension Father   . Stroke Father   . Melanoma Father        melanoma it is terminal   . Cancer Father        melanoma  . Breast cancer Maternal Grandmother        breast   . Thyroid disease Maternal Grandmother   . Cancer Maternal Grandfather        unknown   . Cancer Paternal Grandmother        unknow   . Colon cancer Neg Hx   . Esophageal cancer Neg Hx   . Esophageal varices Neg Hx   . Stomach cancer Neg Hx     SOCIAL HISTORY:  Social History   Socioeconomic History  . Marital status: Married    Spouse name: Not on file  . Number of children: 4  . Years of education: HS+  . Highest education level: Not on file  Occupational History  . Occupation: Psychologist, sport and exercisenurse tech  Tobacco Use  . Smoking  status: Never Smoker  . Smokeless tobacco: Never Used  Vaping Use  . Vaping Use: Never used  Substance and Sexual Activity  . Alcohol use: Yes    Alcohol/week: 0.0 standard drinks    Comment: 2-3 drinks per week  . Drug use: No  . Sexual activity: Yes    Partners: Male    Birth control/protection: Surgical  Other Topics Concern  . Not on file  Social History Narrative   Lives at home with  husband and children.   Right-handed.   2-3 cups caffeine per week.   Social Determinants of Health   Financial Resource Strain:   . Difficulty of Paying Living Expenses: Not on file  Food Insecurity:   . Worried About Programme researcher, broadcasting/film/video in the Last Year: Not on file  . Ran Out of Food in the Last Year: Not on file  Transportation Needs:   . Lack of Transportation (Medical): Not on file  . Lack of Transportation (Non-Medical): Not on file  Physical Activity:   . Days of Exercise per Week: Not on file  . Minutes of Exercise per Session: Not on file  Stress:   . Feeling of Stress : Not on file  Social Connections:   . Frequency of Communication with Friends and Family: Not on file  . Frequency of Social Gatherings with Friends and Family: Not on file  . Attends Religious Services: Not on file  . Active Member of Clubs or Organizations: Not on file  . Attends Banker Meetings: Not on file  . Marital Status: Not on file  Intimate Partner Violence:   . Fear of Current or Ex-Partner: Not on file  . Emotionally Abused: Not on file  . Physically Abused: Not on file  . Sexually Abused: Not on file       PHYSICAL EXAM   There were no vitals filed for this visit. There is no height or weight on file to calculate BMI.   There is no height or weight on file to calculate BMI.   PHYSICAL EXAMNIATION:  Gen: NAD, pleasant middle-aged Caucasian female, conversant, well nourised, obese, well groomed  Cardiovascular: Regular rate rhythm, no peripheral edema, warm,  nontender. Eyes: Conjunctivae clear without exudates or hemorrhage Neck: Supple, no carotid bruise. Pulmonary: Clear to auscultation bilaterally   NEUROLOGICAL EXAM:  MENTAL STATUS: Speech:    Speech is normal; fluent and spontaneous with normal comprehension.  Cognition:     Orientation to time, place and person     Normal recent and remote memory     Normal Attention span and concentration     Normal Language, naming, repeating,spontaneous speech     Fund of knowledge appropriate   CRANIAL NERVES: CN II: Visual fields are full to confrontation. Pupils are round equal and briskly reactive to light. CN III, IV, VI: extraocular movement are normal. No ptosis. CN V: Facial sensation is intact  CN VII: Face is symmetric with normal eye closure and smile. CN VIII: Hearing is normal to rubbing fingers CN IX, X: Palate elevates symmetrically. Phonation is normal. CN XI: Head turning and shoulder shrug are intact CN XII: Tongue is midline with normal movements and no atrophy.  MOTOR: There is no pronator drift of out-stretched arms. Muscle bulk and tone are normal. Muscle strength is normal.  REFLEXES: Reflexes are 2+ and symmetric at the biceps, triceps, knees, and ankles.   SENSORY: Intact to light touch, pinprick, positional sensation and vibratory sensation are intact in fingers and toes.  COORDINATION: Rapid alternating movements and fine finger movements are intact. There is no dysmetria on finger-to-nose and heel-knee-shin.    GAIT/STANCE: Posture is normal. Gait is steady with normal steps, base, arm swing, and turning. Heel and toe walking are normal. Tandem gait is normal.      ASSESSMENT AND PLAN  Marzetta Lanza is a 44 y.o. female   Chronic migraine headaches History of left carotid artery aneurysm, status post Starstent placement  Discussion regarding  initiating Aimovig monthly injections with ongoing use of tizanidine and tramadol as needed.  Due to lack of  insurance coverage, safety net foundation paperwork completed by Jessica Molina and will fax for possible assistance in medication coverage.  Discussion regarding use of good Rx to assist with coverage of tizanidine and tramadol.  She has failed multiple medications in the past without benefit and recently self discontinued Ajovy due to lack of benefit.  Advised Jessica Molina that our office will keep her updated regarding determination of assistance.  Recommend follow-up in 6 months or call earlier if needed    Greater than 50% of time during this 15 minute visit was spent on discussing ongoing migraines and further management recommendations and answered all questions to Jessica Molina satisfaction     Ihor Austin, [facility]  [facility] [address] [place], [place] [address]  Phone [phone] Fax [phone] Note: This document was prepared with digital dictation and possible smart phrase technology. Any transcriptional errors that result from this process are unintentional.

## 2020-08-08 ENCOUNTER — Other Ambulatory Visit: Payer: Self-pay | Admitting: Family Medicine

## 2020-08-09 ENCOUNTER — Ambulatory Visit: Payer: Self-pay | Admitting: Adult Health

## 2020-08-09 ENCOUNTER — Telehealth: Payer: Self-pay | Admitting: *Deleted

## 2020-08-09 ENCOUNTER — Other Ambulatory Visit: Payer: Self-pay | Admitting: Adult Health

## 2020-08-09 ENCOUNTER — Other Ambulatory Visit: Payer: Self-pay

## 2020-08-09 ENCOUNTER — Encounter: Payer: Self-pay | Admitting: Adult Health

## 2020-08-09 VITALS — BP 138/94 | HR 87 | Ht 63.0 in | Wt 136.0 lb

## 2020-08-09 DIAGNOSIS — G43709 Chronic migraine without aura, not intractable, without status migrainosus: Secondary | ICD-10-CM

## 2020-08-09 DIAGNOSIS — M542 Cervicalgia: Secondary | ICD-10-CM

## 2020-08-09 DIAGNOSIS — S134XXA Sprain of ligaments of cervical spine, initial encounter: Secondary | ICD-10-CM

## 2020-08-09 MED ORDER — MELOXICAM 15 MG PO TBDP: 15.0000 mg | ORAL_TABLET | Freq: Every day | ORAL | 3 refills | Status: DC | PRN

## 2020-08-09 MED ORDER — TIZANIDINE HCL 4 MG PO TABS: 6.0000 mg | ORAL_TABLET | Freq: Two times a day (BID) | ORAL | 11 refills | Status: DC | PRN

## 2020-08-09 NOTE — Patient Instructions (Addendum)
Your Plan:  Continue Aimovig and trial Bennie Pierini which is a daily form of CRGP which will hopefully knock out the 2 extra migraines per month. Please call after 2 weeks if beneficial for your migraine headaches  In regards to the back of head and neck pain, this is likely more coming from your back and neck and recommend trial of meloxicam 15 mg daily as needed as well as moist heat, neck stretches and massage. Continue tizanidine but can increase dose to 6mg  twice daily as needed          Thank you for coming to see at [facility]. I hope we have been able to provide you high quality care today.  You may receive a patient satisfaction survey over the next few weeks. We would appreciate your feedback and comments so that we may continue to improve ourselves and the health of our patients.      Cervicogenic Headache  A cervicogenic headache is a headache caused by a condition that affects the bones and tissues in your neck (cervical spine). In a cervicogenic headache, the pain moves from your neck to your head. Most cervicogenic headaches start in the upper part of the neck with the first three cervical bones (cervical vertebrae). A cervicogenic headache is diagnosed when a cause can be found in the cervical spine and other causes of headaches can be ruled out. What are the causes? The most common cause of this condition is a traumatic injury to the cervical spine, such as whiplash. Other causes include:  Arthritis.  Broken bone (fracture).  Infection.  Tumor. What are the signs or symptoms? The most common symptoms are neck and head pain. The pain is often located on one side. In some cases, there may be head pain without neck pain. Pain may be felt in the neck, back or side of the head, face, or behind the eyes. Other symptoms include:  Limited movement in the neck.  Arm or shoulder pain. How is this diagnosed? This condition may be diagnosed based  on:  Your symptoms.  A physical exam.  An injection that blocks nerve signals (nerve block).  Imaging tests, such as: ? X-rays. ? CT scan. ? MRI. How is this treated? Treatment for this condition may depend on the underlying condition. Treatment may include:  Medicines, such as: ? NSAIDs. ? Muscle relaxants.  Physical therapy.  Massage therapy.  Complementary therapies, such as: ? Biofeedback. ? Meditation. ? Acupuncture.  Nerve block injections.  Botulinum toxin injections. Your treatment plan may involve working with a pain management team that includes your primary health care provider, a pain management specialist, a neurologist, and a physical therapist. Follow these instructions at home:  Take over-the-counter and prescription medicines only as told by your health care provider.  Do exercises at home as told by your physical therapist.  Return to your normal activities as told by your health care provider. Ask your health care provider what activities are safe for you. Avoid activities that trigger your headaches.  Maintain good neck support and posture at home and at work.  Keep all follow-up visits as told by your health care provider. This is important. Contact a health care provider if you have:  Headaches that are getting worse and happening more often.  Headaches with any of the following: ? Fever. ? Numbness. ? Weakness. ? Dizziness. ? Nausea or vomiting. Get help right away if:  You have a very sudden and severe headache. Summary  A cervicogenic headache is a headache caused by a condition that affects the bones and tissues in your cervical spine.  Your health care provider may diagnose this condition with a physical exam, a nerve block, and imaging tests.  Treatment may include medicine to reduce pain and inflammation, physical therapy, and nerve block injections.  Complementary therapies, such as acupuncture and meditation, may be added  to other treatments.  Your treatment plan may involve working with a pain management team that includes your primary health care provider, a pain management specialist, a neurologist, and a physical therapist. This information is not intended to replace advice given to you by your health care provider. Make sure you discuss any questions you have with your health care provider. Document Revised: 01/06/2019 Document Reviewed: 09/26/2017 Elsevier Patient Education  2020 [employer].

## 2020-08-09 NOTE — Telephone Encounter (Signed)
Qulipta start form completed, signed, faxed to [place]. Received a receipt of confirmation.  Qulipta 60 mg daily #30 with 11 refills ordered.
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
## 2020-08-10 ENCOUNTER — Encounter: Payer: Self-pay | Admitting: Adult Health

## 2020-08-10 MED ORDER — AMBULATORY NON FORMULARY MEDICATION: 60.0000 mg | Freq: Every day | 11 refills | Status: DC

## 2020-08-10 NOTE — Telephone Encounter (Signed)
She is able to take meloxicam but would you recommend something different?

## 2020-08-11 ENCOUNTER — Telehealth: Payer: Self-pay | Admitting: Adult Health

## 2020-08-11 ENCOUNTER — Other Ambulatory Visit: Payer: Self-pay | Admitting: Psychiatry

## 2020-08-11 ENCOUNTER — Telehealth: Payer: Self-pay | Admitting: Psychiatry

## 2020-08-11 MED ORDER — GABAPENTIN 300 MG PO CAPS
300.0000 mg | ORAL_CAPSULE | Freq: Three times a day (TID) | ORAL | 0 refills | Status: DC
Start: 2020-08-11 — End: 2020-10-02

## 2020-08-11 MED ORDER — TRAMADOL HCL 50 MG PO TABS: 50.0000 mg | ORAL_TABLET | Freq: Two times a day (BID) | ORAL | 3 refills | Status: DC | PRN

## 2020-08-11 MED ORDER — UBRELVY 50 MG PO TABS: 50.0000 mg | ORAL_TABLET | ORAL | 6 refills | Status: DC | PRN

## 2020-08-11 MED ORDER — DULOXETINE HCL 30 MG PO CPEP: 30.0000 mg | ORAL_CAPSULE | Freq: Every day | ORAL | 0 refills | Status: DC

## 2020-08-11 MED ORDER — PROMETHAZINE HCL 25 MG PO TABS: 25.0000 mg | ORAL_TABLET | Freq: Three times a day (TID) | ORAL | 0 refills | Status: DC | PRN
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
## 2020-08-11 NOTE — Telephone Encounter (Signed)
Called the patient to advise that I spoke with Dr Terrace Arabia and she is aware that pt is reluctant to trying meloxicam based off side effects she read about. She recommended trying cymbalta 30 mg daily to see if that has affect on helping neck muscles which will hopefully help the migraines especially in combination with phenergan for nausea Pt states that she believes that she had tried cymbalta in past before when she was trying several antidepressants and she feels like she had a interaction to this medication. She was not 100% sure. I advised that I do not see where it is listed in history of meds as being taken and it is not listed as a allergy or contraindication. She states that she will check her records when she gets home. I advised for now this is what Dr Terrace Arabia is recommending and that she had just reviewed her chart before I had called.  Pt states in past she had used tramadol along with the tizanidine when she is having really bad migraines such as this. She is asking if Dr Terrace Arabia can send a refill in for that and consider looking at dosage, she has been on it for so long that 50 mg does not help like it used to.  I advised that I would send a message to Dr Terrace Arabia asking if she wants to refill tramadol and decide if she wants to make dose changes.

## 2020-08-11 NOTE — Addendum Note (Signed)
Addended by: Judi Cong on: 08/11/2020 11:55 AM   Modules accepted: Orders

## 2020-08-11 NOTE — Telephone Encounter (Signed)
I would not give her more tramadol, last refill was 30 tablet on Sept 22 2021, will limit 30 tablets every month.  The other options are ubrelvy 50 prn for migraine,   Meds ordered this encounter  Medications  . promethazine (PHENERGAN) 25 MG tablet    Sig: Take 1 tablet (25 mg total) by mouth every 8 (eight) hours as needed for nausea or vomiting.    Dispense:  20 tablet    Refill:  0  . DULoxetine (CYMBALTA) 30 MG capsule    Sig: Take 1 capsule (30 mg total) by mouth daily.    Dispense:  30 capsule    Refill:  0  . Ubrogepant (UBRELVY) 50 MG TABS    Sig: Take 50 mg by mouth as needed.    Dispense:  12 tablet    Refill:  6

## 2020-08-11 NOTE — Addendum Note (Signed)
Addended by: Levert Feinstein on: 08/11/2020 12:14 PM   Modules accepted: Orders

## 2020-08-11 NOTE — Telephone Encounter (Signed)
I have routed the call to Dr Terrace Arabia since she is aware of the patient after discussing with Shanda Bumps, NP. Will wait for instruction on what she recommends for the patient.

## 2020-08-11 NOTE — Telephone Encounter (Signed)
Do you want her to come in a work in spot?

## 2020-08-11 NOTE — Telephone Encounter (Signed)
Pt has called to report increased migraine frequency Pt is asking for a call from someone due to her being at a point of vomiting more often.  Pt is aware the office isnt open but she would like to hear from someone on what can be done.  Please call

## 2020-08-11 NOTE — Addendum Note (Signed)
Addended by: Levert Feinstein on: 08/11/2020 12:23 PM   Modules accepted: Orders

## 2020-08-11 NOTE — Telephone Encounter (Signed)
Call to schedule earlier
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
## 2020-08-11 NOTE — Telephone Encounter (Signed)
No.  Just put her on cancellation list

## 2020-08-14 NOTE — Telephone Encounter (Signed)
Patient called back and discussed options, she had already picked up her gabapentin and thought her neurologist Rx it so she is good on that. Advised her to increase doxepine, she just picked up a new Rx so she has plenty for now. Will call back with any issues.   Informed her she would be on cancellation list

## 2020-08-14 NOTE — Telephone Encounter (Signed)
LM to call back to discuss.

## 2020-08-18 ENCOUNTER — Other Ambulatory Visit: Payer: Self-pay | Admitting: Family Medicine

## 2020-08-31 ENCOUNTER — Other Ambulatory Visit: Payer: Self-pay | Admitting: Family Medicine

## 2020-08-31 ENCOUNTER — Other Ambulatory Visit: Payer: Self-pay | Admitting: Neurology

## 2020-08-31 ENCOUNTER — Other Ambulatory Visit: Payer: Self-pay

## 2020-08-31 DIAGNOSIS — F4001 Agoraphobia with panic disorder: Secondary | ICD-10-CM

## 2020-08-31 DIAGNOSIS — F5105 Insomnia due to other mental disorder: Secondary | ICD-10-CM

## 2020-08-31 MED ORDER — DOXEPIN HCL 50 MG PO CAPS: 50.0000 mg | ORAL_CAPSULE | Freq: Every day | ORAL | 0 refills | Status: DC

## 2020-08-31 MED ORDER — CLONAZEPAM 0.5 MG PO TABS: 0.5000 mg | ORAL_TABLET | Freq: Three times a day (TID) | ORAL | 1 refills | Status: DC | PRN

## 2020-08-31 NOTE — Telephone Encounter (Signed)
Patient called requesting refill for her Clonazepam and Doxepin. Her dose increased to 50 mg doxepin on 08/11/2020 per phone message.  Last refill on Clonazepam 07/27/2020  Pended both for Dr. Jennelle Human to review and send

## 2020-09-04 NOTE — Telephone Encounter (Signed)
Jessica Molina, any update on Quilepta?

## 2020-09-07 ENCOUNTER — Other Ambulatory Visit: Payer: Self-pay | Admitting: Family Medicine

## 2020-09-13 ENCOUNTER — Telehealth: Payer: Self-pay | Admitting: *Deleted

## 2020-09-13 NOTE — Telephone Encounter (Signed)
I attempted to do a PA for [place].  My insurance [employer] did not show eligibility, call [facility] and they did not have that pt has used insurance for her medications.  I sent her email to try [employer] assist for ubrelvy.

## 2020-09-16 ENCOUNTER — Other Ambulatory Visit: Payer: Self-pay | Admitting: Family Medicine

## 2020-09-26 ENCOUNTER — Other Ambulatory Visit: Payer: Self-pay | Admitting: Family Medicine

## 2020-09-27 ENCOUNTER — Encounter: Payer: Self-pay | Admitting: Family Medicine

## 2020-09-30 ENCOUNTER — Other Ambulatory Visit: Payer: Self-pay | Admitting: Psychiatry

## 2020-09-30 DIAGNOSIS — F5105 Insomnia due to other mental disorder: Secondary | ICD-10-CM

## 2020-10-02 NOTE — Telephone Encounter (Signed)
I returned the call to the patient. She does not have insurance and would like an estimate on the cost of Botox prior to scheduling an appt w/ Dr. Terrace Arabia.

## 2020-10-02 NOTE — Telephone Encounter (Signed)
I would recommend she follows up with Dr. Terrace Arabia to discuss this further

## 2020-10-03 NOTE — Telephone Encounter (Signed)
Per Angie, her cost with a discount would be $1483.50 per injection. I called the patient and LVM to discuss with her.

## 2020-10-03 NOTE — Telephone Encounter (Signed)
Yes, for migraines.

## 2020-10-03 NOTE — Telephone Encounter (Signed)
Would this be for regular migraine prophylaxis?

## 2020-10-04 NOTE — Telephone Encounter (Signed)
Jessica Molina was initiated by Dr. Terrace Arabia.  I am not aware of these associated side effects but Dr. Terrace Arabia may have a different opinion.

## 2020-10-04 NOTE — Telephone Encounter (Signed)
I was able to call patient and speak with her today regarding Botox. Advised patient of the cost. She is familiar with [employer]'s patient assistance program because she is pursuing it for another medication at this time. Patient states she planned to come to the office to drop off the form for this medication for MD to sign and fax to [employer]. I advised patient that I would print out the [employer] patient assistance form for Botox and when patient comes to the office she may sign this form also and I will give to MD to sign. Patient was agreeable and appreciative.

## 2020-10-04 NOTE — Telephone Encounter (Signed)
I spoke to pt.  She will be coming into the office to sign on botox,  She will also sign [employer] form for qulipta PAP.  I relayed that qulipta form was completed and faxed over , they did receive and email sent to pt (she did not receive may have been in junk/spam.  She had received samples 8 tabs.  Will send in PAP enrollment form prior to more samples.  She did not SE of Ubrelvy concerning bp and sleeping problems.  She did not want to go that route. Will be here tomorrow to sign and then will fax.

## 2020-10-05 ENCOUNTER — Encounter: Payer: Self-pay | Admitting: Nurse Practitioner

## 2020-10-05 ENCOUNTER — Ambulatory Visit (INDEPENDENT_AMBULATORY_CARE_PROVIDER_SITE_OTHER): Payer: Self-pay | Admitting: Nurse Practitioner

## 2020-10-05 ENCOUNTER — Other Ambulatory Visit: Payer: Self-pay

## 2020-10-05 VITALS — BP 104/64 | HR 68 | Resp 16 | Wt 144.0 lb

## 2020-10-05 DIAGNOSIS — N898 Other specified noninflammatory disorders of vagina: Secondary | ICD-10-CM

## 2020-10-05 DIAGNOSIS — N760 Acute vaginitis: Secondary | ICD-10-CM

## 2020-10-05 DIAGNOSIS — B9689 Other specified bacterial agents as the cause of diseases classified elsewhere: Secondary | ICD-10-CM

## 2020-10-05 LAB — WET PREP FOR TRICH, YEAST, CLUE

## 2020-10-05 MED ORDER — METRONIDAZOLE 500 MG PO TABS: 500.0000 mg | ORAL_TABLET | Freq: Two times a day (BID) | ORAL | 0 refills | Status: AC

## 2020-10-05 NOTE — Progress Notes (Signed)
   Acute Office Visit  Subjective:    Patient ID: Jessica Molina, female    DOB: 08/19/1976, 45 y.o.   MRN: [id number]   HPI 45 y.o. presents today for vaginal discharge with odor that she noticed a few weeks ago.    Review of Systems  Constitutional: Negative.   Genitourinary: Positive for vaginal discharge.       Objective:    Physical Exam Constitutional:      Appearance: Normal appearance.  Genitourinary:    General: Normal vulva.     Vagina: Vaginal discharge present. No erythema.     Uterus: Absent.      BP 104/64   Pulse 68   Resp 16   Wt 144 lb (65.3 kg)   LMP 06/19/2016 (Exact Date)   BMI 25.51 kg/m  Wt Readings from Last 3 Encounters:  10/05/20 144 lb (65.3 kg)  08/09/20 136 lb (61.7 kg)  09/01/19 168 lb (76.2 kg)   Wet prep + clue cells     Assessment & Plan:   Problem List Items Addressed This Visit   None   Visit Diagnoses    Bacterial vaginosis    -  Primary   Relevant Medications   metroNIDAZOLE (FLAGYL) 500 MG tablet   Vaginal discharge          Plan: Wet prep positive for clue cells. Flagyl 500 mg twice daily x 7 days. Instructed to take with food and avoid alcohol. She will return to office if symptoms worsen or do not improve. She is agreeable to plan.     [name] [facility], 2:00 PM 10/05/2020

## 2020-10-05 NOTE — Patient Instructions (Signed)

## 2020-10-05 NOTE — Addendum Note (Signed)
Addended byWyline Beady on: 10/05/2020 03:12 PM   Modules accepted: Orders

## 2020-10-06 ENCOUNTER — Other Ambulatory Visit: Payer: Self-pay | Admitting: Family Medicine

## 2020-10-09 MED ORDER — QULIPTA 60 MG PO TABS: 60.0000 mg | ORAL_TABLET | Freq: Every day | ORAL | 0 refills | Status: DC

## 2020-10-09 NOTE — Addendum Note (Signed)
Addended by: Guy Begin on: 10/09/2020 08:58 AM   Modules accepted: Orders

## 2020-10-10 ENCOUNTER — Telehealth: Payer: Self-pay | Admitting: Adult Health

## 2020-10-10 NOTE — Telephone Encounter (Signed)
[employer] ([employer]) called, Pt application for Jessica Molina is not showing in our system, which means we have not received it. We need the fax number nurse faxed the application from to look the form. Contact info: [phone].

## 2020-10-10 NOTE — Telephone Encounter (Signed)
I have copy of pts form, I do not see that I have fax confirmation sheet, so I may not have faxed.  I refaxed [phone] (confirmation received).

## 2020-10-11 NOTE — Telephone Encounter (Signed)
I also received [employer] by fax concerning aimovig.  I completed on our part, need signature and then will give to pt to complete if she wants.  I have 8 more sample tablets for qulipta for pt since I failed to fax.

## 2020-10-12 MED ORDER — QULIPTA 60 MG PO TABS: 60.0000 mg | ORAL_TABLET | Freq: Every day | ORAL | 0 refills | Status: DC

## 2020-10-12 NOTE — Telephone Encounter (Signed)
Attempted to call PAP for Qulipta, and 3 rd time 10-12-20 was on hold for 22 minutes.  I could not hold anymore.  Pt will have to call and check on this herself since PAP.  She is aware via mychart.

## 2020-10-12 NOTE — Addendum Note (Signed)
Addended by: Guy Begin on: 10/12/2020 06:38 AM   Modules accepted: Orders

## 2020-10-13 ENCOUNTER — Other Ambulatory Visit: Payer: Self-pay

## 2020-10-13 ENCOUNTER — Ambulatory Visit: Payer: Medicaid Other | Admitting: Family Medicine

## 2020-10-13 ENCOUNTER — Encounter: Payer: Self-pay | Admitting: Family Medicine

## 2020-10-13 ENCOUNTER — Other Ambulatory Visit: Payer: Self-pay | Admitting: Neurology

## 2020-10-13 MED ORDER — HYDROCHLOROTHIAZIDE 25 MG PO TABS: 25.0000 mg | ORAL_TABLET | Freq: Every day | ORAL | 2 refills | Status: DC

## 2020-10-14 ENCOUNTER — Other Ambulatory Visit: Payer: Self-pay | Admitting: Family Medicine

## 2020-10-16 ENCOUNTER — Encounter: Payer: Self-pay | Admitting: Family Medicine

## 2020-10-16 ENCOUNTER — Telehealth (INDEPENDENT_AMBULATORY_CARE_PROVIDER_SITE_OTHER): Payer: Medicaid Other | Admitting: Family Medicine

## 2020-10-16 VITALS — BP 122/68 | HR 72 | Wt 138.0 lb

## 2020-10-16 DIAGNOSIS — I10 Essential (primary) hypertension: Secondary | ICD-10-CM

## 2020-10-16 MED ORDER — LOSARTAN POTASSIUM 100 MG PO TABS
100.0000 mg | ORAL_TABLET | Freq: Every day | ORAL | 1 refills | Status: DC
Start: 2020-10-16 — End: 2021-05-07

## 2020-10-16 MED ORDER — HYDROCHLOROTHIAZIDE 25 MG PO TABS
25.0000 mg | ORAL_TABLET | Freq: Every day | ORAL | 1 refills | Status: DC
Start: 2020-10-16 — End: 2021-04-04

## 2020-10-16 NOTE — Progress Notes (Signed)
Virtual Visit via Video Note  I connected with [name] on 10/16/20 at  3:20 PM EST by a video enabled telemedicine application and verified that I am speaking with the correct person using two identifiers.  Location: Patient: home alone  Provider: home    I discussed the limitations of evaluation and management by telemedicine and the availability of in person appointments. The patient expressed understanding and agreed to proceed.  History of Present Illness: Pt is home and needs f/u bp.  She has no complaints except she has no ins and would like a letter from [place] saying she is unemployed and has no ins.  She is wanting pt assistance esp for her meds from neurology.  They are very expensive.     Observations/Objective: Vitals:   10/16/20 1506  BP: 122/68  Pulse: 72   Pt is in nad No sob , no cp   Assessment and Plan: 1. Primary hypertension Well controlled, no changes to meds. Encouraged heart healthy diet such as the DASH diet and exercise as tolerated.  - losartan (COZAAR) 100 MG tablet; Take 1 tablet (100 mg total) by mouth daily.  Dispense: 90 tablet; Refill: 1 - hydrochlorothiazide (HYDRODIURIL) 25 MG tablet; Take 1 tablet (25 mg total) by mouth daily.  Dispense: 90 tablet; Refill: 1   Follow Up Instructions: rto 6 months in person for exam and labs    I discussed the assessment and treatment plan with the patient. The patient was provided an opportunity to ask questions and all were answered. The patient agreed with the plan and demonstrated an understanding of the instructions.   The patient was advised to call back or seek an in-person evaluation if the symptoms worsen or if the condition fails to improve as anticipated.  I provided 25 minutes of non-face-to-face time during this encounter.   Donato Schultz, DO

## 2020-10-17 NOTE — Telephone Encounter (Signed)
Faxed Botox patient assistance application to [employer] at [phone].

## 2020-10-17 NOTE — Telephone Encounter (Deleted)
Hi Moneisha,   Just wanted to let you know that I faxed your application for Botox to [employer]. I'm not sure if they will contact you or me, but let me know if you hear from them. You can also reach them at [phone] with any questions.  Thank you,  Ladona Ridgel Botox Coordinator

## 2020-10-19 ENCOUNTER — Encounter: Payer: Self-pay | Admitting: Family Medicine

## 2020-10-19 NOTE — Telephone Encounter (Signed)
I called  [employer] for aimovig.  Spoke to [place], they have received all the information needed on our part (including prescription).

## 2020-10-25 ENCOUNTER — Encounter: Payer: Self-pay | Admitting: Psychiatry

## 2020-10-25 ENCOUNTER — Other Ambulatory Visit: Payer: Self-pay | Admitting: Psychiatry

## 2020-10-25 ENCOUNTER — Ambulatory Visit (INDEPENDENT_AMBULATORY_CARE_PROVIDER_SITE_OTHER): Payer: Self-pay | Admitting: Psychiatry

## 2020-10-25 ENCOUNTER — Other Ambulatory Visit: Payer: Self-pay

## 2020-10-25 DIAGNOSIS — F3341 Major depressive disorder, recurrent, in partial remission: Secondary | ICD-10-CM

## 2020-10-25 DIAGNOSIS — F411 Generalized anxiety disorder: Secondary | ICD-10-CM

## 2020-10-25 DIAGNOSIS — F4001 Agoraphobia with panic disorder: Secondary | ICD-10-CM

## 2020-10-25 DIAGNOSIS — F5105 Insomnia due to other mental disorder: Secondary | ICD-10-CM

## 2020-10-25 MED ORDER — ESCITALOPRAM OXALATE 10 MG PO TABS: ORAL_TABLET | ORAL | 1 refills | Status: DC

## 2020-10-25 MED ORDER — DOXAZOSIN MESYLATE 4 MG PO TABS: ORAL_TABLET | ORAL | 1 refills | Status: DC

## 2020-10-25 NOTE — Patient Instructions (Signed)
For sleep try increasing the doxepin to 2 nightly. If that does not help or gives additional side effects then reduce back to 1 nightly and start Doxazosin 1/2 tablet for 1 week then 1 tablet each night.  For panic start Lexapro Escitalopram 1/2 tablet at night for 1 week then 1 tablet each night

## 2020-10-25 NOTE — Telephone Encounter (Signed)
Received approval for PAP for Qulipta 60mg  #30 from [facility] assist good thru 10-15-2021.  To place refill [phone] 2 wks prior to needing medication.
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
## 2020-11-07 ENCOUNTER — Encounter: Payer: Self-pay | Admitting: Adult Health

## 2020-11-07 NOTE — Telephone Encounter (Signed)
It does not look like she started Botox yet.  Are we able to get her in sooner to receive Botox injections?

## 2020-11-08 NOTE — Telephone Encounter (Signed)
Dr Terrace Arabia, you will be seeing this patient in March for Botox for continued migraines.  You please further assist in regards to further recommendations for treatment options at this time. She does not have insurance and has trialed multiple medications in the past.

## 2020-11-08 NOTE — Telephone Encounter (Signed)
Is there anyone else in the office that is able to assist with this or schedule botox?

## 2020-11-09 ENCOUNTER — Telehealth: Payer: Self-pay | Admitting: *Deleted

## 2020-11-09 NOTE — Telephone Encounter (Signed)
I left message requesting a call back.

## 2020-11-09 NOTE — Telephone Encounter (Signed)
I spoke to the patient. She is currently taking Qulipta 60mg  daily. She is getting this medication through a free drug program. She does not feel Aimovig was very helpful. She is currently using Tramadol and tizanidine for pain relief. She is sensitive to NSAIDS. She would like to be worked-in sooner for her Botox. I offered this afternoon but she does not have transportation. She accepted a work-in time on 11/13/20 at 10:45am. She will check in 15 minutes early. She has free Botox here. She has not called [employer] about co-pay assistance for the injection/procedure cost but plans to check with them. She still wants to come in regardless. She was transferred to 11/15/20 to discuss a discounted cash price for the procedure part.

## 2020-11-09 NOTE — Telephone Encounter (Signed)
The patient does not have any insurance. Dr. Terrace Arabia needs to clarify which medications is she taking now. We need to discuss both preventive and rescue medications. What has worked well? She has free Botox here. She can be offered a discount, cash rate for the procedure cost (may need Angie to help). It looks like she has an appt already scheduled on 12/27/20 for Botox. Dr. Terrace Arabia will be happy to work her in earlier.   Waiting on the patient to return my call. I have tried her twice today. Left message with our number.

## 2020-11-09 NOTE — Telephone Encounter (Signed)
  Migraine   You routed conversation to You Just now (11:33 AM)   You 18 minutes ago (11:15 AM)      I left message requesting a call back.      Documentation    Ihor Austin, NP  [place]land Pink, RN; Levert Feinstein, MD 18 hours ago (5:05 PM)      Dr Terrace Arabia, you will be seeing this patient in March for Botox for continued migraines.  You please further assist in regards to further recommendations for treatment options at this time. She does not have insurance and has trialed multiple medications in the past.

## 2020-11-09 NOTE — Telephone Encounter (Signed)
Her previous message was sent to [facility]. Colen Darling RN started handling it the same day. Several employees are working to help her. Dr. Terrace Arabia will review her chart today to see what medication changes can be make to her treatment. She is aware the patient does not have insurance.

## 2020-11-13 ENCOUNTER — Ambulatory Visit: Payer: Medicaid Other | Admitting: Neurology

## 2020-11-13 ENCOUNTER — Telehealth: Payer: Self-pay | Admitting: Neurology

## 2020-11-13 NOTE — Telephone Encounter (Signed)
Hi Mrs . Garr just give a Ladona Ridgel a call back when you would like to scheduled . Your Botox apt . I hope You Feel Better .  Ladona Ridgel is out of the office This week . Thanks Annabelle Harman   Sent my chart message as well.

## 2020-11-14 NOTE — Telephone Encounter (Signed)
(  2) 100U vials of Botox delivered today from patient assistance program. Patient cancelled 2/14 appointment. She was advised to call back when she would like to reschedule.

## 2020-11-14 NOTE — Telephone Encounter (Signed)
(  2) 100U vials of Botox delivered today from patient assistance program. Patient cancelled 2/14 appointment per Dana's message. She was advised to call back when she would like to reschedule.

## 2020-11-14 NOTE — Telephone Encounter (Signed)
Patient will need to wait until her Botox arrives from the patient assistance program. It is not here yet at this time.

## 2020-11-16 ENCOUNTER — Other Ambulatory Visit: Payer: Self-pay | Admitting: Psychiatry

## 2020-11-16 DIAGNOSIS — F5105 Insomnia due to other mental disorder: Secondary | ICD-10-CM

## 2020-11-16 DIAGNOSIS — F4001 Agoraphobia with panic disorder: Secondary | ICD-10-CM

## 2020-11-16 DIAGNOSIS — F3341 Major depressive disorder, recurrent, in partial remission: Secondary | ICD-10-CM

## 2020-11-16 DIAGNOSIS — F411 Generalized anxiety disorder: Secondary | ICD-10-CM

## 2020-11-20 ENCOUNTER — Other Ambulatory Visit: Payer: Self-pay | Admitting: Neurology

## 2020-11-20 ENCOUNTER — Other Ambulatory Visit: Payer: Self-pay | Admitting: Psychiatry

## 2020-11-20 DIAGNOSIS — F4001 Agoraphobia with panic disorder: Secondary | ICD-10-CM

## 2020-11-20 NOTE — Telephone Encounter (Signed)
Appt March

## 2020-11-23 ENCOUNTER — Telehealth: Payer: Self-pay | Admitting: Psychiatry

## 2020-11-23 NOTE — Telephone Encounter (Signed)
Pt called and said that she has been feeling awful for 5 days now. She said that her bp dropped and that she is dizzy. Her speech is slurred. She thinks it is the new medicine you put her on. Pleasse give her a call at [phone]

## 2020-11-23 NOTE — Telephone Encounter (Signed)
Called patient.  At the last appointment we increased doxepin from 50 to 100 mg nightly and added doxazosin for nightmares.  It is more likely that the slurred speech is related to doxepin.  However to be sure, tonight she should reduce doxazosin to 2 mg nightly and reduce doxepin to 50 mg nightly.  Once her side effects are resolved she can increase the doxazosin back to 4 mg nightly for nightmares.  She might need to go higher.  But keep doxepin at 50 mg nightly.  Call us back early next week if she is not markedly improved.

## 2020-11-23 NOTE — Telephone Encounter (Signed)
Rtc to patient and she reports she is having worse nightmares and can't sleep. She reports feeling like she can see some of the things in her room. She reports she feels like it's the medication. She's taking doxepin 100 mg. She also reports her agoraphobia is worse and only takes the dog to the vet.  She is having slurred speech on the phone, she sounds sleepy or tired. I had to have her repeat a couple things when she was talking. Not sure if that would be normal behavior for her.

## 2020-11-23 NOTE — Telephone Encounter (Signed)
LM to call back to discuss symptoms. Not sure which medication she is referring to.

## 2020-11-24 NOTE — Telephone Encounter (Signed)
reviewed

## 2020-12-13 ENCOUNTER — Ambulatory Visit: Payer: Self-pay | Admitting: Psychiatry

## 2020-12-19 ENCOUNTER — Other Ambulatory Visit: Payer: Self-pay | Admitting: Neurology

## 2020-12-19 ENCOUNTER — Other Ambulatory Visit: Payer: Self-pay | Admitting: Psychiatry

## 2020-12-19 ENCOUNTER — Encounter: Payer: Self-pay | Admitting: Adult Health

## 2020-12-19 DIAGNOSIS — F3341 Major depressive disorder, recurrent, in partial remission: Secondary | ICD-10-CM

## 2020-12-19 DIAGNOSIS — F411 Generalized anxiety disorder: Secondary | ICD-10-CM

## 2020-12-19 DIAGNOSIS — F5105 Insomnia due to other mental disorder: Secondary | ICD-10-CM

## 2020-12-19 DIAGNOSIS — F4001 Agoraphobia with panic disorder: Secondary | ICD-10-CM

## 2020-12-19 NOTE — Telephone Encounter (Signed)
Is it okay to send this?Her previous note does not mention this Rx

## 2020-12-19 NOTE — Telephone Encounter (Signed)
Call to RS

## 2020-12-19 NOTE — Telephone Encounter (Signed)
Please review

## 2020-12-20 MED ORDER — TRAMADOL HCL 50 MG PO TABS
50.0000 mg | ORAL_TABLET | Freq: Two times a day (BID) | ORAL | 0 refills | Status: DC | PRN
Start: 2020-12-20 — End: 2021-02-05

## 2020-12-20 NOTE — Telephone Encounter (Signed)
Is it okay to send?

## 2020-12-20 NOTE — Telephone Encounter (Signed)
LM to call back for appointment.
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
## 2020-12-20 NOTE — Telephone Encounter (Signed)
I sent message that made appt F/A 01-18-21 0730 with Dr. Terrace Arabia.  She is to call back if that does not work for her.

## 2020-12-20 NOTE — Telephone Encounter (Signed)
Pt has apt 4/13

## 2020-12-20 NOTE — Telephone Encounter (Signed)
Okay to fill for 30 days but needs to schedule f/u visit with Dr. Terrace Arabia as she cancelled recently scheduled visits

## 2020-12-21 ENCOUNTER — Other Ambulatory Visit: Payer: Self-pay | Admitting: Neurology

## 2020-12-21 NOTE — Telephone Encounter (Signed)
I would prefer that she sees Dr. Terrace Arabia for further discussion of migraine treatment as she has tried multiple medications without benefit and limited to options due to lack of insurance coverage

## 2020-12-25 ENCOUNTER — Ambulatory Visit (INDEPENDENT_AMBULATORY_CARE_PROVIDER_SITE_OTHER): Payer: Self-pay | Admitting: Neurology

## 2020-12-25 ENCOUNTER — Encounter: Payer: Self-pay | Admitting: Neurology

## 2020-12-25 VITALS — BP 127/89 | HR 103 | Ht 63.0 in | Wt 165.5 lb

## 2020-12-25 DIAGNOSIS — G43709 Chronic migraine without aura, not intractable, without status migrainosus: Secondary | ICD-10-CM
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
## 2020-12-25 NOTE — Progress Notes (Signed)
**  Botox 100 units x 2 vials, NDC 9892-1194-17, Lot [id number], Exp 12/2022, medication provided through patient assistance program.//mck,rn**

## 2020-12-27 ENCOUNTER — Ambulatory Visit: Payer: Medicaid Other | Admitting: Neurology

## 2021-01-10 ENCOUNTER — Telehealth (INDEPENDENT_AMBULATORY_CARE_PROVIDER_SITE_OTHER): Payer: Self-pay | Admitting: Psychiatry

## 2021-01-10 ENCOUNTER — Telehealth: Payer: Self-pay | Admitting: Psychiatry

## 2021-01-10 ENCOUNTER — Encounter: Payer: Self-pay | Admitting: Psychiatry

## 2021-01-10 DIAGNOSIS — F3341 Major depressive disorder, recurrent, in partial remission: Secondary | ICD-10-CM

## 2021-01-10 DIAGNOSIS — F4001 Agoraphobia with panic disorder: Secondary | ICD-10-CM

## 2021-01-10 DIAGNOSIS — F411 Generalized anxiety disorder: Secondary | ICD-10-CM

## 2021-01-10 DIAGNOSIS — F5105 Insomnia due to other mental disorder: Secondary | ICD-10-CM

## 2021-01-10 MED ORDER — QUETIAPINE FUMARATE 25 MG PO TABS: 25.0000 mg | ORAL_TABLET | Freq: Every day | ORAL | 1 refills | Status: DC

## 2021-01-10 MED ORDER — FLUOXETINE HCL 10 MG PO CAPS: ORAL_CAPSULE | ORAL | 1 refills | Status: DC

## 2021-01-10 MED ORDER — GABAPENTIN 300 MG PO CAPS: 600.0000 mg | ORAL_CAPSULE | Freq: Every day | ORAL | 1 refills | Status: DC
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
## 2021-01-10 NOTE — Telephone Encounter (Signed)
Pt called to clarify should she  continue taking Wellbutrin with Prozac ? Contact Pt @ [phone]

## 2021-01-10 NOTE — Telephone Encounter (Signed)
Yes until we determine the Prozac (fluoxetine) is effective.

## 2021-01-11 ENCOUNTER — Other Ambulatory Visit: Payer: Self-pay | Admitting: Psychiatry

## 2021-01-11 ENCOUNTER — Telehealth: Payer: Self-pay | Admitting: Psychiatry

## 2021-01-11 DIAGNOSIS — F4001 Agoraphobia with panic disorder: Secondary | ICD-10-CM

## 2021-01-11 MED ORDER — GABAPENTIN 300 MG PO CAPS: 600.0000 mg | ORAL_CAPSULE | Freq: Three times a day (TID) | ORAL | 1 refills | Status: DC

## 2021-01-11 NOTE — Telephone Encounter (Signed)
Pt called wanting to clarify the Gabapentin she is taking. Pt said that the rx on the bottle say take 2 at night only, but her understanding was to take 2 three times qd. Please call to clarify.

## 2021-01-11 NOTE — Telephone Encounter (Signed)
Please let her know that she is correct.  The gabapentin should be taken 2 capsules 3 times daily.  Corrected prescription was sent to the pharmacy but she can use her gabapentin that she has on hand.

## 2021-01-11 NOTE — Telephone Encounter (Signed)
Called Pt to advise info.

## 2021-01-15 NOTE — Telephone Encounter (Signed)
Rtc to pt ,she also stated she is taking Seroquel and klonopin at bedtime.She takes 2 Seroquel and is not sleeping more then 3 hours at a time.She wants to know what can be done about this

## 2021-01-15 NOTE — Telephone Encounter (Signed)
Pt has been informed.

## 2021-01-15 NOTE — Telephone Encounter (Signed)
Tell her that fluoxetine can prolong the effect of Wellbutrin and increase the blood level and that may be the cause of her worsening insomnia.  Wellbutrin is the same as bupropion.  Tell her to stop the bupropion and she can increase the Seroquel to 3 at night if needed.  It would take a couple of days from stopping the bupropion before that helps.

## 2021-01-16 ENCOUNTER — Telehealth: Payer: Self-pay | Admitting: *Deleted

## 2021-01-16 ENCOUNTER — Other Ambulatory Visit: Payer: Self-pay | Admitting: Psychiatry

## 2021-01-16 DIAGNOSIS — F5105 Insomnia due to other mental disorder: Secondary | ICD-10-CM

## 2021-01-16 NOTE — Telephone Encounter (Signed)
Received from [facility] that need more information from pt and prescribing information missing or unclear.  I called pt as last note unclear if taking or not.  Pt states she is not taking.  Had BOTOX last visit.  Taking other oral prevetative and rescue meds.   Will disregard form for the PAP amgen for aimovig per pt.

## 2021-01-17 ENCOUNTER — Other Ambulatory Visit: Payer: Self-pay | Admitting: Psychiatry

## 2021-01-17 DIAGNOSIS — F5105 Insomnia due to other mental disorder: Secondary | ICD-10-CM

## 2021-01-18 ENCOUNTER — Ambulatory Visit: Payer: Medicaid Other | Admitting: Neurology

## 2021-01-23 ENCOUNTER — Telehealth: Payer: Self-pay | Admitting: Psychiatry

## 2021-01-23 ENCOUNTER — Other Ambulatory Visit: Payer: Self-pay | Admitting: Psychiatry

## 2021-01-23 DIAGNOSIS — F5105 Insomnia due to other mental disorder: Secondary | ICD-10-CM

## 2021-01-23 MED ORDER — QUETIAPINE FUMARATE 100 MG PO TABS: ORAL_TABLET | ORAL | 0 refills | Status: DC

## 2021-01-23 NOTE — Telephone Encounter (Signed)
Pt called to report she has stopped Wellbutrin and increased the Seroquel. Seroquel is not causing hang over in the morning. Continue having trouble sleeping. Please advise what's next. Also,  Pt will need Rx for what ever med you decide. Contact # [phone]

## 2021-01-23 NOTE — Telephone Encounter (Signed)
Pt informed

## 2021-01-23 NOTE — Telephone Encounter (Signed)
Quetiapine can be prescribed up to 800 mg at night so the current tablet she has of 25 mg is very low.  I will send in a prescription for 100 mg tablet and she can take between 1 and 2 at night.  Quetiapine is typically the most effective meds for sleep

## 2021-01-23 NOTE — Telephone Encounter (Signed)
Please review

## 2021-01-29 ENCOUNTER — Other Ambulatory Visit: Payer: Self-pay | Admitting: Psychiatry

## 2021-01-29 ENCOUNTER — Telehealth: Payer: Self-pay | Admitting: Psychiatry

## 2021-01-29 NOTE — Telephone Encounter (Signed)
Rtc to pt and she reports feeling so tired in the mornings, unable to get on treadmill just wants to go back to bed. She's too dizzy and lightheaded as well. Plus still not helping her sleep. She did get up to 100 mg but just can't continue to feel like this. She was hoping trying it again would get a different outcome but she's unable to take.   Please advise. Pt will be near her pharmacy/[facility] until 2 pm if something can be sent prior to that.

## 2021-01-29 NOTE — Telephone Encounter (Signed)
Pt having trouble taking Seroquel. Pt said that she is dizzy and light headed, and can't sleep. Pt would like to stop taking. Please call.

## 2021-01-29 NOTE — Telephone Encounter (Signed)
Please review

## 2021-01-30 NOTE — Telephone Encounter (Signed)
Rtc to pt and she reports she did try Doxepin 100 mg which was also ineffective, she has also tried Hydroxyzine and didn't help either. Informed her I would get samples of Belsomra 20 mg 1 nightly to pick up. She agreed and was appreciative.

## 2021-01-30 NOTE — Telephone Encounter (Signed)
At her last appointment she said that the doxepin was helping her sleep at 50 mg nightly.  She called back later stating it was not helping and we switched her to Seroquel which she could not tolerate.  She is already on clonazepam. She has failed to respond to mirtazapine and doxazosin.  Did she ever try doxepin 100 mg nightly?  She could try that and may have some of those left on hand.  The other option is hydroxyzine 25 mg capsules 1-2 nightly.  I do not see that on her med list but she may have tried it at some point. After that it is probably trying something like Belsomra 20 mg nightly.  We do have some samples of that 1.

## 2021-02-01 ENCOUNTER — Other Ambulatory Visit: Payer: Self-pay | Admitting: Psychiatry

## 2021-02-01 DIAGNOSIS — F411 Generalized anxiety disorder: Secondary | ICD-10-CM

## 2021-02-01 DIAGNOSIS — F4001 Agoraphobia with panic disorder: Secondary | ICD-10-CM

## 2021-02-01 DIAGNOSIS — F3341 Major depressive disorder, recurrent, in partial remission: Secondary | ICD-10-CM

## 2021-02-05 ENCOUNTER — Other Ambulatory Visit: Payer: Self-pay | Admitting: Psychiatry

## 2021-02-05 ENCOUNTER — Telehealth: Payer: Self-pay | Admitting: Psychiatry

## 2021-02-05 ENCOUNTER — Other Ambulatory Visit: Payer: Self-pay | Admitting: Adult Health

## 2021-02-05 DIAGNOSIS — F4001 Agoraphobia with panic disorder: Secondary | ICD-10-CM

## 2021-02-05 NOTE — Telephone Encounter (Signed)
I will check into a PA

## 2021-02-05 NOTE — Telephone Encounter (Signed)
Next visit is 03/05/21. Jessica Molina got samples of Belsomva for sleep. The pharmacy told her it would be $500 a month to get this filled with her insurance. She cant afford it. What is the next step she can take: Her phone # is [phone]

## 2021-02-05 NOTE — Telephone Encounter (Signed)
[place] controlled drug registry was checked, last filed 12/20/20. Pt was last seen 12/25/20 with Dr.Yan for botox. Previously seen with McCue 08/09/20, advised to FU in 6 months. Has upcoming appt 03/28/21 with Dr.Yan for botox. Refill is appropriate on my end and is pended for approval. Please advise.

## 2021-02-05 NOTE — Telephone Encounter (Signed)
Please review.Maybe a PA?

## 2021-02-06 ENCOUNTER — Telehealth: Payer: Self-pay | Admitting: Psychiatry

## 2021-02-06 NOTE — Telephone Encounter (Signed)
Yesterday you said you would look into a PA but apparently she does not have insurance,but the message also says the pharmacy told her it would be $500 with her insurance so that is strange that she says she has none.

## 2021-02-06 NOTE — Telephone Encounter (Signed)
I have her down for Medicaid? Does she not have that?

## 2021-02-06 NOTE — Telephone Encounter (Signed)
Pt called to check status of the first message. She doesn't have insurance so a PA will not work. She has not been able to sleep more than 2 hours a night. This is affecting her anxiety that she doesn't want to leave the house. Please find a solution and give her a call at [phone]

## 2021-02-06 NOTE — Telephone Encounter (Signed)
According to sarah's message she does not have insurance

## 2021-02-06 NOTE — Telephone Encounter (Signed)
Next apt 6/06 Last refill 4/10

## 2021-02-07 ENCOUNTER — Telehealth: Payer: Self-pay

## 2021-02-07 NOTE — Telephone Encounter (Signed)
Pt left message on v-mail reporting she has not slept in days more than 2 hours at a time. Anxiety increasing due to lack of sleep. Panic attacks getting longer  1-2 hrs @ a time .Would you be able to give an emergency med. Contact @ [phone]

## 2021-02-07 NOTE — Telephone Encounter (Signed)
I will contact her pharmacy to confirm

## 2021-02-07 NOTE — Telephone Encounter (Signed)
Pt calling back. See previous message from [facility]

## 2021-02-07 NOTE — Telephone Encounter (Signed)
Received a msg from pt concerning the expense of the Belsomra. Pt was given samples. Contacted her pharmacy and they have not received a Rx for her Belsomra so not sure where her quote came from. They report she has not given them any insurance information so assume she does not have any.   Unclear if Dr. Jennelle Human was aware pt has no insurance. Not sure how to proceed.

## 2021-02-08 NOTE — Telephone Encounter (Signed)
Pt has called again. °

## 2021-02-08 NOTE — Telephone Encounter (Signed)
She has treatment resistant anxiety and treatment resistant insomnia and has failed or failed to tolerate multiple medications.  There is nothing else I know to do until I see her in an appointment.  Put her on the cancellation list.

## 2021-02-08 NOTE — Telephone Encounter (Signed)
Pt called back to see if she can get something else to help her sleep. Pt also having panic attacks that are lasting over 2 hours. Please call.

## 2021-02-09 NOTE — Telephone Encounter (Signed)
She's been added to wait list

## 2021-02-09 NOTE — Telephone Encounter (Signed)
reviewed

## 2021-02-09 NOTE — Telephone Encounter (Signed)
I did let her know she had to be seen and was added to the cancellation list.She was still asking for options.I let her know if she gets to the point of thoughts of self harm to go to [facility] and she said she has before and they didn't help.

## 2021-02-09 NOTE — Telephone Encounter (Signed)
Please add pt to cancellation list   Sheralyn Boatman let her know Dr. Jennelle Human is not able to add anything else until she is seen

## 2021-02-20 ENCOUNTER — Other Ambulatory Visit: Payer: Self-pay

## 2021-02-20 ENCOUNTER — Ambulatory Visit (INDEPENDENT_AMBULATORY_CARE_PROVIDER_SITE_OTHER): Payer: Self-pay | Admitting: Psychiatry

## 2021-02-20 ENCOUNTER — Encounter: Payer: Self-pay | Admitting: Psychiatry

## 2021-02-20 DIAGNOSIS — F4001 Agoraphobia with panic disorder: Secondary | ICD-10-CM

## 2021-02-20 DIAGNOSIS — F3341 Major depressive disorder, recurrent, in partial remission: Secondary | ICD-10-CM

## 2021-02-20 DIAGNOSIS — F5105 Insomnia due to other mental disorder: Secondary | ICD-10-CM

## 2021-02-20 DIAGNOSIS — F411 Generalized anxiety disorder: Secondary | ICD-10-CM

## 2021-02-20 MED ORDER — TOPIRAMATE 25 MG PO TABS
ORAL_TABLET | ORAL | 0 refills | Status: DC
Start: 2021-02-20 — End: 2021-03-19

## 2021-02-20 MED ORDER — CLONAZEPAM 1 MG PO TABS: ORAL_TABLET | ORAL | 0 refills | Status: DC
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
## 2021-02-24 ENCOUNTER — Other Ambulatory Visit: Payer: Self-pay | Admitting: Psychiatry

## 2021-02-24 DIAGNOSIS — F3341 Major depressive disorder, recurrent, in partial remission: Secondary | ICD-10-CM

## 2021-02-24 DIAGNOSIS — F411 Generalized anxiety disorder: Secondary | ICD-10-CM

## 2021-02-24 DIAGNOSIS — F4001 Agoraphobia with panic disorder: Secondary | ICD-10-CM

## 2021-03-05 ENCOUNTER — Ambulatory Visit: Payer: Self-pay | Admitting: Psychiatry

## 2021-03-14 ENCOUNTER — Other Ambulatory Visit: Payer: Self-pay | Admitting: Psychiatry

## 2021-03-14 DIAGNOSIS — F411 Generalized anxiety disorder: Secondary | ICD-10-CM

## 2021-03-14 DIAGNOSIS — F4001 Agoraphobia with panic disorder: Secondary | ICD-10-CM

## 2021-03-15 ENCOUNTER — Telehealth: Payer: Self-pay | Admitting: Neurology

## 2021-03-15 NOTE — Telephone Encounter (Signed)
Patient has a Botox appointment 6/29. I called Kristen @ [facility] [phone]) to reorder Botox for the appt. She advised me to fill out the Botox product request form and fax it back to them.

## 2021-03-16 NOTE — Telephone Encounter (Signed)
Please contact pt about her dose and if taking 100 mg

## 2021-03-19 ENCOUNTER — Other Ambulatory Visit: Payer: Self-pay

## 2021-03-19 NOTE — Telephone Encounter (Signed)
Pt left a message that she is taking Topamax 100mg . Please send to [facility] on [address].

## 2021-03-22 ENCOUNTER — Other Ambulatory Visit: Payer: Self-pay | Admitting: Psychiatry

## 2021-03-22 DIAGNOSIS — F3341 Major depressive disorder, recurrent, in partial remission: Secondary | ICD-10-CM

## 2021-03-22 NOTE — Telephone Encounter (Signed)
Received (2) 100 unit vials of Botox today from [employer] patient assistance program.

## 2021-03-27 ENCOUNTER — Other Ambulatory Visit: Payer: Self-pay | Admitting: Adult Health

## 2021-03-28 ENCOUNTER — Ambulatory Visit (INDEPENDENT_AMBULATORY_CARE_PROVIDER_SITE_OTHER): Payer: Self-pay | Admitting: Neurology

## 2021-03-28 ENCOUNTER — Other Ambulatory Visit: Payer: Self-pay

## 2021-03-28 ENCOUNTER — Encounter: Payer: Self-pay | Admitting: Neurology

## 2021-03-28 VITALS — BP 116/78 | HR 63 | Ht 63.0 in | Wt 162.0 lb

## 2021-03-28 DIAGNOSIS — G43709 Chronic migraine without aura, not intractable, without status migrainosus: Secondary | ICD-10-CM

## 2021-03-28 NOTE — Progress Notes (Signed)
**  Botox 100 units x 2 vials, NDC 1950-9326-71, Lot [id number], Exp 03/2023, medication came from patient assistance program.//[id number],[email or web address]**
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
## 2021-04-04 ENCOUNTER — Other Ambulatory Visit: Payer: Self-pay | Admitting: Family Medicine

## 2021-04-04 ENCOUNTER — Other Ambulatory Visit: Payer: Self-pay | Admitting: Psychiatry

## 2021-04-04 DIAGNOSIS — I10 Essential (primary) hypertension: Secondary | ICD-10-CM

## 2021-04-04 DIAGNOSIS — F411 Generalized anxiety disorder: Secondary | ICD-10-CM

## 2021-04-04 DIAGNOSIS — F4001 Agoraphobia with panic disorder: Secondary | ICD-10-CM

## 2021-05-06 ENCOUNTER — Other Ambulatory Visit: Payer: Self-pay | Admitting: Family Medicine

## 2021-05-06 DIAGNOSIS — I10 Essential (primary) hypertension: Secondary | ICD-10-CM

## 2021-05-07 ENCOUNTER — Other Ambulatory Visit: Payer: Self-pay | Admitting: Neurology

## 2021-05-09 ENCOUNTER — Other Ambulatory Visit: Payer: Self-pay | Admitting: Family Medicine

## 2021-05-09 DIAGNOSIS — I10 Essential (primary) hypertension: Secondary | ICD-10-CM

## 2021-05-15 ENCOUNTER — Ambulatory Visit (INDEPENDENT_AMBULATORY_CARE_PROVIDER_SITE_OTHER): Payer: Self-pay | Admitting: Psychiatry

## 2021-05-15 ENCOUNTER — Other Ambulatory Visit: Payer: Self-pay

## 2021-05-15 ENCOUNTER — Encounter: Payer: Self-pay | Admitting: Psychiatry

## 2021-05-15 DIAGNOSIS — F4310 Post-traumatic stress disorder, unspecified: Secondary | ICD-10-CM

## 2021-05-15 DIAGNOSIS — F4001 Agoraphobia with panic disorder: Secondary | ICD-10-CM

## 2021-05-15 DIAGNOSIS — F3341 Major depressive disorder, recurrent, in partial remission: Secondary | ICD-10-CM

## 2021-05-15 DIAGNOSIS — F5105 Insomnia due to other mental disorder: Secondary | ICD-10-CM

## 2021-05-15 DIAGNOSIS — F411 Generalized anxiety disorder: Secondary | ICD-10-CM

## 2021-05-15 MED ORDER — TOPIRAMATE 100 MG PO TABS: 100.0000 mg | ORAL_TABLET | Freq: Two times a day (BID) | ORAL | 0 refills | Status: DC

## 2021-05-15 MED ORDER — CLONAZEPAM 1 MG PO TABS: ORAL_TABLET | ORAL | 3 refills | Status: DC

## 2021-05-15 MED ORDER — FLUOXETINE HCL 20 MG PO CAPS: ORAL_CAPSULE | ORAL | 1 refills | Status: DC
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
## 2021-05-15 NOTE — Patient Instructions (Signed)
Topiramate increase 100 mg nightly and 50 mg in AM for 2 weeks and if needed to 100 mg twice daily.

## 2021-06-07 ENCOUNTER — Telehealth: Payer: Self-pay | Admitting: Neurology

## 2021-06-07 NOTE — Telephone Encounter (Signed)
Patient's next Botox appointment is 10/5. I filled out order form to request shipment of Botox from [employer] Patient Assistance Program. Botox TBD 9/20 (requested date). Phone:  [phone].

## 2021-06-19 NOTE — Telephone Encounter (Signed)
Received (2) 100 unit vials of Botox from [facility] Patient Assistance Program.

## 2021-07-04 ENCOUNTER — Encounter: Payer: Self-pay | Admitting: Neurology

## 2021-07-04 ENCOUNTER — Ambulatory Visit (INDEPENDENT_AMBULATORY_CARE_PROVIDER_SITE_OTHER): Payer: Self-pay | Admitting: Neurology

## 2021-07-04 VITALS — BP 123/84 | HR 68 | Ht 63.0 in | Wt 161.0 lb

## 2021-07-04 DIAGNOSIS — G43709 Chronic migraine without aura, not intractable, without status migrainosus: Secondary | ICD-10-CM

## 2021-07-04 DIAGNOSIS — F418 Other specified anxiety disorders: Secondary | ICD-10-CM | POA: Insufficient documentation

## 2021-07-04 MED ORDER — PROMETHAZINE HCL 25 MG PO TABS: 25.0000 mg | ORAL_TABLET | Freq: Four times a day (QID) | ORAL | 5 refills | Status: DC | PRN

## 2021-07-04 MED ORDER — TIZANIDINE HCL 4 MG PO TABS: 4.0000 mg | ORAL_TABLET | Freq: Three times a day (TID) | ORAL | 11 refills | Status: DC | PRN

## 2021-07-04 MED ORDER — TRAMADOL HCL 50 MG PO TABS: 50.0000 mg | ORAL_TABLET | Freq: Two times a day (BID) | ORAL | 5 refills | Status: DC | PRN

## 2021-07-04 NOTE — Progress Notes (Signed)
**  Botox 100 units x 2 vials, NDC 7505-1833-58, Lot [id number], Exp 12/2022, medication provided through patient assistance program.//[email or web address]**

## 2021-07-04 NOTE — Patient Instructions (Signed)
w
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
## 2021-08-15 ENCOUNTER — Other Ambulatory Visit: Payer: Self-pay | Admitting: Adult Health

## 2021-08-15 ENCOUNTER — Ambulatory Visit (INDEPENDENT_AMBULATORY_CARE_PROVIDER_SITE_OTHER): Payer: Self-pay | Admitting: Psychiatry

## 2021-08-15 ENCOUNTER — Encounter: Payer: Self-pay | Admitting: Psychiatry

## 2021-08-15 DIAGNOSIS — F411 Generalized anxiety disorder: Secondary | ICD-10-CM

## 2021-08-15 DIAGNOSIS — F3341 Major depressive disorder, recurrent, in partial remission: Secondary | ICD-10-CM

## 2021-08-15 DIAGNOSIS — F4310 Post-traumatic stress disorder, unspecified: Secondary | ICD-10-CM

## 2021-08-15 DIAGNOSIS — F4001 Agoraphobia with panic disorder: Secondary | ICD-10-CM

## 2021-08-15 DIAGNOSIS — F5105 Insomnia due to other mental disorder: Secondary | ICD-10-CM

## 2021-08-15 MED ORDER — CLONAZEPAM 1 MG PO TABS: ORAL_TABLET | ORAL | 3 refills | Status: DC

## 2021-08-15 MED ORDER — TOPIRAMATE 100 MG PO TABS: 100.0000 mg | ORAL_TABLET | Freq: Two times a day (BID) | ORAL | 1 refills | Status: DC

## 2021-08-15 MED ORDER — QULIPTA 60 MG PO TABS: 60.0000 mg | ORAL_TABLET | Freq: Every day | ORAL | 0 refills | Status: DC

## 2021-08-15 MED ORDER — FLUOXETINE HCL 40 MG PO CAPS: 40.0000 mg | ORAL_CAPSULE | Freq: Every day | ORAL | 1 refills | Status: DC
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
## 2021-09-06 ENCOUNTER — Telehealth: Payer: Self-pay | Admitting: Neurology

## 2021-09-06 NOTE — Telephone Encounter (Signed)
Received letter from [facility] Patient Assistance Program regarding Botox. Letter states patient's authorization will end 10/2021. They are requesting that patient reapply to prevent any lapses in care. I completed office portion of form and will give to MD to sign. I will reach out to patient regarding her portion.

## 2021-09-12 NOTE — Telephone Encounter (Signed)
Received (2) 100 unit vials of Botox today via [employer] Botox Patient Assistance.

## 2021-09-26 ENCOUNTER — Ambulatory Visit: Payer: Self-pay | Admitting: Neurology

## 2021-09-26 NOTE — Telephone Encounter (Signed)
Patient was scheduled to come in today for Botox but called this morning and cancelled d/t being sick.

## 2021-10-04 ENCOUNTER — Ambulatory Visit (INDEPENDENT_AMBULATORY_CARE_PROVIDER_SITE_OTHER): Payer: Self-pay | Admitting: Neurology

## 2021-10-04 VITALS — BP 122/88 | HR 96 | Ht 63.0 in | Wt 161.0 lb

## 2021-10-04 DIAGNOSIS — G43709 Chronic migraine without aura, not intractable, without status migrainosus: Secondary | ICD-10-CM

## 2021-10-04 NOTE — Progress Notes (Signed)
Botox- 200 units VEL:[id number] Expiration: 11/2023 NDC: 1025-8527-78

## 2021-10-04 NOTE — Procedures (Signed)
° ° ° °  BOTOX PROCEDURE NOTE FOR MIGRAINE HEADACHE   HISTORY: Jessica Molina presents today for Botox injection for chronic migraine headache.  She was last injected by Dr. Terrace Arabia July 04, 2021.  She has failed multiple preventative medications. This is her 4th injection. Reports 75% improvement in headaches. Now 3-4 migraines a week, less intense, used to have daily intense migraine before Botox.    Description of procedure:  The patient was placed in a sitting position. The standard protocol was used for Botox as follows, with 5 units of Botox injected at each site:   -Procerus muscle, midline injection  -Corrugator muscle, bilateral injection  -Frontalis muscle, bilateral injection, with 2 sites each side, medial injection was performed in the upper one third of the frontalis muscle, in the region vertical from the medial inferior edge of the superior orbital rim. The lateral injection was again in the upper one third of the forehead vertically above the lateral limbus of the cornea, 1.5 cm lateral to the medial injection site.  -Temporalis muscle injection, 4 sites, bilaterally. The first injection was 3 cm above the tragus of the ear, second injection site was 1.5 cm to 3 cm up from the first injection site in line with the tragus of the ear. The third injection site was 1.5-3 cm forward between the first 2 injection sites. The fourth injection site was 1.5 cm posterior to the second injection site.  -Occipitalis muscle injection, 3 sites, bilaterally. The first injection was done one half way between the occipital protuberance and the tip of the mastoid process behind the ear. The second injection site was done lateral and superior to the first, 1 fingerbreadth from the first injection. The third injection site was 1 fingerbreadth superiorly and medially from the first injection site.  -Cervical paraspinal muscle injection, 2 sites, bilateral, the first injection site was 1 cm from the  midline of the cervical spine, 3 cm inferior to the lower border of the occipital protuberance. The second injection site was 1.5 cm superiorly and laterally to the first injection site.  -Trapezius muscle injection was performed at 3 sites, bilaterally. The first injection site was in the upper trapezius muscle halfway between the inflection point of the neck, and the acromion. The second injection site was one half way between the acromion and the first injection site. The third injection was done between the first injection site and the inflection point of the neck.   A 200 unit bottle of Botox was used, 155 units were injected, the rest of the Botox was wasted. The patient tolerated the procedure well, there were no complications of the above procedure.  Botox NDC 3532-9924 Lot number [id number] Expiration date 11/2023

## 2021-10-04 NOTE — Telephone Encounter (Signed)
Patient had her Botox appointment today and was able to sign Botox patient assistance form. I faxed form & records she provided today to [facility]. Phone: [phone].

## 2021-10-05 ENCOUNTER — Other Ambulatory Visit: Payer: Self-pay | Admitting: Family Medicine

## 2021-10-05 DIAGNOSIS — I10 Essential (primary) hypertension: Secondary | ICD-10-CM

## 2021-10-08 ENCOUNTER — Telehealth: Payer: Self-pay

## 2021-10-08 MED ORDER — QULIPTA 60 MG PO TABS: 60.0000 mg | ORAL_TABLET | Freq: Every day | ORAL | 5 refills | Status: DC

## 2021-10-08 NOTE — Telephone Encounter (Signed)
I called patient. PA for quilpta was approved by [facility] Medicaid. She is quite surprised; she was under the assumption that she had family planning coverage only.  She would like the [employer] sent to [facility] [employer] at [address] [place].  I advised her that we will keep the paperwork from [employer] in case this PA approval for qulipta does not work. Pt verbalized understanding.

## 2021-10-08 NOTE — Telephone Encounter (Signed)
Received paper from patient for [place] Patient Assistance for [place]. Requires PA denial for qulipta to be submitted. Completed PA for qulipta via [employer]. Should have a determination within 1-2 business days. Confirmation #: [id number] W.

## 2021-10-09 NOTE — Telephone Encounter (Addendum)
The patient assistance application has been completed, signed by Dr. Terrace Arabia, fax and confirmed to my [employer] assist at [phone]. The patient has been provided with this update. They also sent the patient notification of approval.

## 2021-10-09 NOTE — Telephone Encounter (Signed)
I called [place] Medicaid. The PA for qulipta was approved but the patient has family planning only and therefore they will not honor this PA approval.  I called patient. I will write that the patient does not have pharmacy coverage with her insurance and pays for drugs OOP. Patient is agreeable to this plan. Dr. Terrace Arabia will review this paperwork.

## 2021-10-09 NOTE — Telephone Encounter (Signed)
Pt called states the [place]ie [place] was not approved, she reached out to [facility] and states it did not go through. Pt requesting a call back.

## 2021-10-23 NOTE — Telephone Encounter (Addendum)
Baxter Hire w/[employer] [phone]) Assist reached out to me by phone on 10/18/21 requesting more information regarding patient's insurance coverage. I indicated that it appears patient only has Medicaid Family Planning. Baxter Hire states they are requiring proof. I advised that we do not have any proof of this on file, only what our system says. Patient does not have a copy of this on file with [place]. I faxed query status of insurance from our system showing that this coverage appears to be for dental and family planning services only.  Today I called & spoke with Erie Noe to check status. She states they still need information to approve. I advised that we have nothing else to send on patient's behalf, so I suggested they reach out to patient.

## 2021-11-01 NOTE — Telephone Encounter (Signed)
Received a fax from [facility] Assist that the patient has been approved for financial assistance with [employer]. Valid through 10/16/2022. Phone # to program: [phone].

## 2021-11-03 ENCOUNTER — Other Ambulatory Visit: Payer: Self-pay | Admitting: Family Medicine

## 2021-11-03 DIAGNOSIS — I10 Essential (primary) hypertension: Secondary | ICD-10-CM

## 2021-11-05 NOTE — Telephone Encounter (Signed)
Faxed patients eligibility letter over to [facility] assistance program

## 2021-11-12 ENCOUNTER — Other Ambulatory Visit: Payer: Self-pay | Admitting: Family Medicine

## 2021-11-12 ENCOUNTER — Telehealth: Payer: Self-pay | Admitting: Family Medicine

## 2021-11-12 DIAGNOSIS — I10 Essential (primary) hypertension: Secondary | ICD-10-CM

## 2021-11-12 MED ORDER — HYDROCHLOROTHIAZIDE 25 MG PO TABS: 25.0000 mg | ORAL_TABLET | Freq: Every day | ORAL | 0 refills | Status: DC

## 2021-11-12 NOTE — Telephone Encounter (Signed)
Refill sent for 30 days. Pt will need OV for further refills

## 2021-11-12 NOTE — Telephone Encounter (Signed)
Pt stated she needs hydrochlorothiazide refilled per med notes labs/evaluation is needed. Pt stated she does not have insurance or the funds to pay for the visit. If meds cant be refilled then she will not be able to take her meds and blood pressure will rise.   Please advise.

## 2021-11-19 NOTE — Telephone Encounter (Signed)
Received approval from [employer]. Completed Valla Leaver assit Botox form and faxed over.

## 2021-11-20 ENCOUNTER — Telehealth: Payer: Self-pay | Admitting: Psychiatry

## 2021-12-08 ENCOUNTER — Other Ambulatory Visit: Payer: Self-pay | Admitting: Family Medicine

## 2021-12-08 DIAGNOSIS — I10 Essential (primary) hypertension: Secondary | ICD-10-CM

## 2021-12-19 ENCOUNTER — Other Ambulatory Visit: Payer: Self-pay | Admitting: Family Medicine

## 2021-12-19 ENCOUNTER — Encounter: Payer: Self-pay | Admitting: Family Medicine

## 2021-12-19 DIAGNOSIS — I10 Essential (primary) hypertension: Secondary | ICD-10-CM

## 2021-12-19 MED ORDER — HYDROCHLOROTHIAZIDE 25 MG PO TABS: 25.0000 mg | ORAL_TABLET | Freq: Every day | ORAL | 0 refills | Status: DC

## 2021-12-26 ENCOUNTER — Ambulatory Visit: Payer: Medicaid Other | Admitting: Neurology

## 2021-12-27 ENCOUNTER — Telehealth: Payer: Self-pay | Admitting: Neurology

## 2021-12-27 NOTE — Telephone Encounter (Signed)
Received (1) 100 unit vial of Botox from [facility]. ?

## 2022-01-01 ENCOUNTER — Ambulatory Visit (INDEPENDENT_AMBULATORY_CARE_PROVIDER_SITE_OTHER): Payer: Self-pay | Admitting: Neurology

## 2022-01-01 VITALS — BP 109/78 | HR 74 | Ht 63.0 in | Wt 161.0 lb

## 2022-01-01 DIAGNOSIS — G43709 Chronic migraine without aura, not intractable, without status migrainosus: Secondary | ICD-10-CM

## 2022-01-01 NOTE — Progress Notes (Signed)
Botox- 100 units x 2 vial ?Lot: [id number] ?Expiration: 05/2024 ?NDC: [phone] ?B/B ?  ?

## 2022-01-01 NOTE — Procedures (Signed)
In route ? ? ? ?BOTOX PROCEDURE NOTE FOR MIGRAINE HEADACHE ? ? ?HISTORY: Jessica Molina here today for Botox injection for chronic migraine headache.  She has tolerated the procedure well in the past.  It continues to be about 75% helpful in reducing the frequency and severity of her migraines.  During the cycle of Botox, she may have 1 migraine a week but is less severe.  ? ?Description of procedure: ? ?The patient was placed in a sitting position. The standard protocol was used for Botox as follows, with 5 units of Botox injected at each site: ? ?-Procerus muscle, midline injection ? ?-Corrugator muscle, bilateral injection ? ?-Frontalis muscle, bilateral injection, with 2 sites each side, medial injection was performed in the upper one third of the frontalis muscle, in the region vertical from the medial inferior edge of the superior orbital rim. The lateral injection was again in the upper one third of the forehead vertically above the lateral limbus of the cornea, 1.5 cm lateral to the medial injection site. ? ?-Temporalis muscle injection, 4 sites, bilaterally. The first injection was 3 cm above the tragus of the ear, second injection site was 1.5 cm to 3 cm up from the first injection site in line with the tragus of the ear. The third injection site was 1.5-3 cm forward between the first 2 injection sites. The fourth injection site was 1.5 cm posterior to the second injection site. ? ?-Occipitalis muscle injection, 3 sites, bilaterally. The first injection was done one half way between the occipital protuberance and the tip of the mastoid process behind the ear. The second injection site was done lateral and superior to the first, 1 fingerbreadth from the first injection. The third injection site was 1 fingerbreadth superiorly and medially from the first injection site. ? ?-Cervical paraspinal muscle injection, 2 sites, bilateral, the first injection site was 1 cm from the midline of the cervical spine, 3 cm inferior  to the lower border of the occipital protuberance. The second injection site was 1.5 cm superiorly and laterally to the first injection site. ? ?-Trapezius muscle injection was performed at 3 sites, bilaterally. The first injection site was in the upper trapezius muscle halfway between the inflection point of the neck, and the acromion. The second injection site was one half way between the acromion and the first injection site. The third injection was done between the first injection site and the inflection point of the neck. ? ? ?A 200 unit bottle of Botox was used, 155 units were injected, the rest of the Botox was wasted. The patient tolerated the procedure well, there were no complications of the above procedure. ? ?Botox NDC [phone] ?Lot number [id number] ?Expiration date 05/2024 ?B/B ? ? ?

## 2022-01-11 ENCOUNTER — Other Ambulatory Visit: Payer: Self-pay | Admitting: Neurology

## 2022-01-13 ENCOUNTER — Other Ambulatory Visit: Payer: Self-pay | Admitting: Family Medicine

## 2022-01-13 DIAGNOSIS — I10 Essential (primary) hypertension: Secondary | ICD-10-CM

## 2022-01-14 NOTE — Telephone Encounter (Signed)
Verify Drug Registry For Tramadol Hcl 50 Mg Tablet ?Last Filled: 11/22/2021 ?Quantity: 30 tablets for 15 days ?Last appointment: 01/01/2022 (Procedure visit) ?Next appointment: 03/27/2022 ?

## 2022-01-31 NOTE — Telephone Encounter (Signed)
Completed [employer]alla Leaver assit Botox form and faxed over.  ?

## 2022-02-07 ENCOUNTER — Other Ambulatory Visit: Payer: Self-pay | Admitting: Family Medicine

## 2022-02-07 DIAGNOSIS — I10 Essential (primary) hypertension: Secondary | ICD-10-CM

## 2022-02-09 ENCOUNTER — Other Ambulatory Visit: Payer: Self-pay | Admitting: Neurology

## 2022-02-13 ENCOUNTER — Telehealth: Payer: Self-pay | Admitting: *Deleted

## 2022-02-13 NOTE — Telephone Encounter (Addendum)
Received Tramadol request from pharmacy. Per [place] narcotic registry, she just filled #30 on 01/14/22. Her refill before that 11/22/21 for #30.  ? ?She gets a small supply for Tramadol for migraines. Per vo by Dr. Terrace Arabia, she should not be allowed more than #10 per month. ? ?I called the patient and reviewed this plan with her. She is in agreement. She has Tramadol left at home and does not actually need a refill at this time. Today's request has been denied.  ? ?She is aware future prescriptions will be written for #10 per 30 days with  no early refills.  ?

## 2022-02-16 ENCOUNTER — Other Ambulatory Visit: Payer: Self-pay | Admitting: Family Medicine

## 2022-02-16 DIAGNOSIS — I10 Essential (primary) hypertension: Secondary | ICD-10-CM

## 2022-02-20 ENCOUNTER — Telehealth: Payer: Self-pay | Admitting: Family Medicine

## 2022-02-20 DIAGNOSIS — I10 Essential (primary) hypertension: Secondary | ICD-10-CM

## 2022-02-20 MED ORDER — HYDROCHLOROTHIAZIDE 25 MG PO TABS: ORAL_TABLET | ORAL | 0 refills | Status: DC

## 2022-02-20 NOTE — Telephone Encounter (Signed)
Medication:   hydrochlorothiazide (HYDRODIURIL) 25 MG tablet [[id number]]  Has the patient contacted their pharmacy? Yes.   (If no, request that the patient contact the pharmacy for the refill.) (If yes, when and what did the pharmacy advise?) Contact PCP  Preferred Pharmacy (with phone number or street name):   [id number] - [place], [place] - [address]  [address] [place] [address]  Phone:  [phone]  Fax:  [phone]   Agent: Please be advised that RX refills may take up to 3 business days. We ask that you follow-up with your pharmacy.

## 2022-02-20 NOTE — Telephone Encounter (Signed)
Referral sent 

## 2022-03-01 ENCOUNTER — Other Ambulatory Visit: Payer: Self-pay | Admitting: Family Medicine

## 2022-03-01 DIAGNOSIS — I10 Essential (primary) hypertension: Secondary | ICD-10-CM

## 2022-03-12 ENCOUNTER — Other Ambulatory Visit: Payer: Self-pay | Admitting: Family Medicine

## 2022-03-12 DIAGNOSIS — I10 Essential (primary) hypertension: Secondary | ICD-10-CM

## 2022-03-15 ENCOUNTER — Other Ambulatory Visit: Payer: Self-pay | Admitting: Neurology

## 2022-03-20 ENCOUNTER — Encounter: Payer: Self-pay | Admitting: *Deleted

## 2022-03-27 ENCOUNTER — Ambulatory Visit: Payer: Self-pay | Admitting: Neurology

## 2022-03-28 ENCOUNTER — Telehealth: Payer: Self-pay | Admitting: Adult Health

## 2022-03-28 NOTE — Telephone Encounter (Signed)
[employer] [employer]) needing new product request form with new dates. Would like a call back.

## 2022-04-02 ENCOUNTER — Encounter: Payer: Self-pay | Admitting: Neurology

## 2022-04-02 ENCOUNTER — Encounter: Payer: Self-pay | Admitting: Family Medicine

## 2022-04-02 DIAGNOSIS — I10 Essential (primary) hypertension: Secondary | ICD-10-CM

## 2022-04-03 ENCOUNTER — Ambulatory Visit: Payer: Self-pay | Admitting: Neurology

## 2022-04-03 MED ORDER — HYDROCHLOROTHIAZIDE 25 MG PO TABS: ORAL_TABLET | ORAL | 0 refills | Status: DC

## 2022-04-03 NOTE — Telephone Encounter (Signed)
Called and lvm for patient to call back and we could explain self pay options

## 2022-04-27 ENCOUNTER — Other Ambulatory Visit: Payer: Self-pay | Admitting: Family Medicine

## 2022-04-27 DIAGNOSIS — I10 Essential (primary) hypertension: Secondary | ICD-10-CM

## 2022-05-19 ENCOUNTER — Other Ambulatory Visit: Payer: Self-pay | Admitting: Family Medicine

## 2022-05-19 ENCOUNTER — Other Ambulatory Visit: Payer: Self-pay | Admitting: Neurology

## 2022-05-19 DIAGNOSIS — I10 Essential (primary) hypertension: Secondary | ICD-10-CM

## 2022-05-23 ENCOUNTER — Ambulatory Visit: Payer: Medicaid Other | Admitting: Family Medicine

## 2022-08-01 ENCOUNTER — Ambulatory Visit: Payer: Self-pay | Admitting: Family Medicine

## 2022-08-08 ENCOUNTER — Other Ambulatory Visit: Payer: Self-pay | Admitting: Neurology

## 2022-08-13 ENCOUNTER — Telehealth: Payer: Self-pay | Admitting: Adult Health

## 2022-08-13 NOTE — Telephone Encounter (Signed)
Contacted pt, informed her we never got refill rq for this medication until today but I did approve refill to [facility]. She was appreciative.

## 2022-08-13 NOTE — Telephone Encounter (Signed)
Pt is just asking for the status of her refill on her tiZANidine .  Pt states the refills have expired.  Pt states she has been 1-2 weeks without the medication.

## 2022-08-13 NOTE — Telephone Encounter (Signed)
Yan/SS pt

## 2022-10-31 ENCOUNTER — Other Ambulatory Visit: Payer: Self-pay | Admitting: Family Medicine

## 2022-10-31 ENCOUNTER — Telehealth: Payer: Self-pay | Admitting: Neurology

## 2022-10-31 DIAGNOSIS — I10 Essential (primary) hypertension: Secondary | ICD-10-CM

## 2022-10-31 NOTE — Telephone Encounter (Signed)
Jessica Molina is calling from Botox assistance program. Stated pt has been in the program for a year and it's time for a renewal application.

## 2022-11-07 NOTE — Telephone Encounter (Signed)
Pt called back. Stated she will not be doing Botox because it isn't working and the assistance program will not pay for her office visit.  Pt said she had had a headache for 4 weeks now, stated she can't go to the hospital because she don't have medical insurance. I tired several times to schedule a follow-up appointment with Dr. Krista Blue. Pt said I thought a nurse was going to call me back. She is requesting a nurse to give her a call to discuss her headaches.

## 2022-11-07 NOTE — Telephone Encounter (Signed)
Called pt. No answer left message to please call return my call.

## 2022-11-07 NOTE — Telephone Encounter (Signed)
Called pt. Left a VM message to please call office.

## 2022-11-08 ENCOUNTER — Other Ambulatory Visit: Payer: Self-pay | Admitting: Family Medicine

## 2022-11-08 ENCOUNTER — Encounter: Payer: Self-pay | Admitting: Neurology

## 2022-11-08 ENCOUNTER — Encounter: Payer: Self-pay | Admitting: Family Medicine

## 2022-11-08 DIAGNOSIS — I10 Essential (primary) hypertension: Secondary | ICD-10-CM

## 2022-11-08 MED ORDER — HYDROCHLOROTHIAZIDE 25 MG PO TABS: ORAL_TABLET | ORAL | 0 refills | Status: DC

## 2022-11-11 ENCOUNTER — Encounter: Payer: Self-pay | Admitting: Neurology

## 2022-11-12 NOTE — Telephone Encounter (Signed)
Fail to reach patient, left message

## 2022-11-19 NOTE — Telephone Encounter (Signed)
Called patient, left message for her to [facility] Back if she still has questions. Last seen by Judson Roch for BOTOX injection in April 2023.

## 2022-11-23 ENCOUNTER — Other Ambulatory Visit: Payer: Self-pay | Admitting: Family Medicine

## 2022-11-23 DIAGNOSIS — I10 Essential (primary) hypertension: Secondary | ICD-10-CM

## 2022-12-01 ENCOUNTER — Other Ambulatory Visit: Payer: Self-pay | Admitting: Family Medicine

## 2022-12-01 DIAGNOSIS — I10 Essential (primary) hypertension: Secondary | ICD-10-CM

## 2022-12-26 ENCOUNTER — Encounter: Payer: Self-pay | Admitting: *Deleted

## 2022-12-26 ENCOUNTER — Other Ambulatory Visit: Payer: Self-pay | Admitting: Family Medicine

## 2022-12-26 DIAGNOSIS — I10 Essential (primary) hypertension: Secondary | ICD-10-CM

## 2023-01-01 ENCOUNTER — Other Ambulatory Visit: Payer: Self-pay | Admitting: Family Medicine

## 2023-01-01 DIAGNOSIS — I10 Essential (primary) hypertension: Secondary | ICD-10-CM

## 2023-01-09 ENCOUNTER — Encounter: Payer: Self-pay | Admitting: *Deleted

## 2023-02-27 ENCOUNTER — Encounter: Payer: Self-pay | Admitting: Nurse Practitioner

## 2023-02-27 ENCOUNTER — Ambulatory Visit (INDEPENDENT_AMBULATORY_CARE_PROVIDER_SITE_OTHER): Payer: Medicaid Other | Admitting: Nurse Practitioner

## 2023-02-27 ENCOUNTER — Telehealth: Payer: Self-pay | Admitting: *Deleted

## 2023-02-27 ENCOUNTER — Other Ambulatory Visit: Payer: Self-pay | Admitting: *Deleted

## 2023-02-27 VITALS — BP 138/84 | HR 80 | Ht 63.0 in | Wt 163.0 lb

## 2023-02-27 DIAGNOSIS — N644 Mastodynia: Secondary | ICD-10-CM | POA: Diagnosis not present

## 2023-02-27 DIAGNOSIS — Z1211 Encounter for screening for malignant neoplasm of colon: Secondary | ICD-10-CM

## 2023-02-27 DIAGNOSIS — Z01419 Encounter for gynecological examination (general) (routine) without abnormal findings: Secondary | ICD-10-CM | POA: Diagnosis not present

## 2023-02-27 DIAGNOSIS — G43109 Migraine with aura, not intractable, without status migrainosus: Secondary | ICD-10-CM | POA: Diagnosis not present

## 2023-02-27 NOTE — Progress Notes (Signed)
Jessica Molina 1975-10-08 [id number]   History:  47 y.o. [name] presents for annual exam. Complains of intermittent bilateral breast pain. Has not felt for any lumps. Denies nipple discharge, skin changes, redness, or swelling. Last mammogram in 2020. Has been nervous for screening due to mother's history of breast cancer. Mother diagnosed with breast cancer at age 75, returned at 81 and died from metastatic cancer.  S/P 2017 TVH with bilateral salpingectomies for menorrhagia.  2004 LEEP, subsequent paps normal. Father melanoma. H/O HTN, no longer on medications. Plans to establish with new PCP. Anxiety and depression managed by psychiatrist, therapy weekly, also in support group. Separated but still living with ex, poor relationship. 4 children, 3 struggling with eating disorders. Used to see neurology for migraines, no improvement with any treatments. Would like new referral.   Gynecologic History Patient's last menstrual period was 06/19/2016 (exact date).   Contraception/Family planning: status post hysterectomy Sexually active: No  Health Maintenance Last Pap: 11/07/2015. Results were: Normal Last mammogram: 03/17/2019. Results were: Normal Last colonoscopy: years ago (early 67s) Last Dexa: Not indicated  Past medical history, past surgical history, family history and social history were all reviewed and documented in the EPIC chart. 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   ROS:  A ROS was performed and pertinent positives and negatives are included.  Exam:  Vitals:   02/27/23 1120  BP: 138/84  Pulse: 80  SpO2: 99%  Weight: 163 lb (73.9 kg)  Height: 5\' 3"  (1.6 m)   Body mass index is 28.87 kg/m.  General appearance:  Normal Thyroid:  Symmetrical, normal in size, without palpable masses or nodularity. Respiratory  Auscultation:  Clear without wheezing or rhonchi Cardiovascular  Auscultation:  Regular rate, without rubs, murmurs or gallops  Edema/varicosities:  Not grossly  evident Abdominal  Soft,nontender, without masses, guarding or rebound.  Liver/spleen:  No organomegaly noted  Hernia:  None appreciated  Skin  Inspection:  Grossly normal Breasts: Examined lying and sitting.   Right: Without masses, retractions, nipple discharge or axillary adenopathy.   Left: Without masses, retractions, nipple discharge or axillary adenopathy. Genitourinary   Inguinal/mons:  Normal without inguinal adenopathy  External genitalia:  Normal appearing vulva with no masses, tenderness, or lesions  BUS/Urethra/Skene's glands:  Normal  Vagina:  Normal appearing with normal color and discharge, no lesions  Cervix:  And uterus absent  Adnexa/parametria:     Rt: Normal in size, without masses or tenderness.   Lt: Normal in size, without masses or tenderness.  Anus and perineum: Normal  Digital rectal exam: Deferred  Patient informed chaperone available to be present for breast and pelvic exam. Patient has requested no chaperone to be present. Patient has been advised what will be completed during breast and pelvic exam.   Assessment/Plan:  47 y.o. [name] for annual exam.   Well female exam with routine gynecological exam - Plan: CBC with Differential/Platelet, Comprehensive metabolic panel, Lipid panel. Education provided on SBEs, importance of preventative screenings, current guidelines, high calcium diet, regular exercise, and multivitamin daily. Will return for fasting labs tomorrow.   Migraine with aura and without status migrainosus, not intractable - Plan: Ambulatory referral to Neurology. Chronic, now having auras. Has tried and failed multiple treatments. Would like referral to new neurologist.   Breast pain - normal exam today. Overdue for mammogram. Will send referral for diagnostic imaging.   Screening for colon cancer - Plan: Cologuard. Discussed importance of preventative screenings and current guidelines. Declines colonoscopy due to prep.  Screening for  cervical cancer - Normal Pap history.  Will repeat at 5-year interval per guidelines.  Screening for breast cancer - Normal mammogram history. Overdue. Mother with history of breast cancer at age 78 and 6.    Return in 1 year for annual.     [name] DNP, 12:45 PM 02/27/2023

## 2023-02-27 NOTE — Telephone Encounter (Signed)
-----   Message from [name], NP sent at 02/27/2023 12:38 PM EDT ----- Regarding: diagnostic breast imaging Please send referral for diagnostic imaging for bilateral breast pain. Thanks.

## 2023-02-28 ENCOUNTER — Other Ambulatory Visit: Payer: Medicaid Other

## 2023-02-28 DIAGNOSIS — Z01419 Encounter for gynecological examination (general) (routine) without abnormal findings: Secondary | ICD-10-CM

## 2023-02-28 LAB — CBC WITH DIFFERENTIAL/PLATELET
Eosinophils Absolute: 128 cells/uL (ref 15–500)
Hemoglobin: 13.9 g/dL (ref 11.7–15.5)
Lymphs Abs: 3328 cells/uL (ref 850–3900)
MPV: 10.4 fL (ref 7.5–12.5)
Neutrophils Relative %: 50.7 %

## 2023-02-28 NOTE — Telephone Encounter (Signed)
Call placed to [facility], spoke with [facility].  Bilateral Dx MMG and L/R [place] scheduled for 03/13/23, 0930.    Spoke with patient, advised as seen above.  Appt reminder with TBC information to front desk for patient to pick up at her lab visit today. Patient verbalizes understanding and is agreeable.   Routing to provider for final review. Patient is agreeable to disposition. Will close encounter.

## 2023-03-01 LAB — COMPREHENSIVE METABOLIC PANEL
AG Ratio: 1.6 (calc) (ref 1.0–2.5)
ALT: 44 U/L — ABNORMAL HIGH (ref 6–29)
AST: 40 U/L — ABNORMAL HIGH (ref 10–35)
Albumin: 4.5 g/dL (ref 3.6–5.1)
Alkaline phosphatase (APISO): 65 U/L (ref 31–125)
BUN: 10 mg/dL (ref 7–25)
CO2: 23 mmol/L (ref 20–32)
Calcium: 10 mg/dL (ref 8.6–10.2)
Chloride: 106 mmol/L (ref 98–110)
Creat: 0.77 mg/dL (ref 0.50–0.99)
Globulin: 2.9 g/dL (calc) (ref 1.9–3.7)
Glucose, Bld: 103 mg/dL — ABNORMAL HIGH (ref 65–99)
Potassium: 4.3 mmol/L (ref 3.5–5.3)
Sodium: 140 mmol/L (ref 135–146)
Total Bilirubin: 0.3 mg/dL (ref 0.2–1.2)
Total Protein: 7.4 g/dL (ref 6.1–8.1)

## 2023-03-01 LAB — CBC WITH DIFFERENTIAL/PLATELET
Absolute Monocytes: 400 cells/uL (ref 200–950)
Basophils Absolute: 88 cells/uL (ref 0–200)
Basophils Relative: 1.1 %
Eosinophils Relative: 1.6 %
HCT: 42.6 % (ref 35.0–45.0)
MCH: 28.7 pg (ref 27.0–33.0)
MCHC: 32.6 g/dL (ref 32.0–36.0)
MCV: 87.8 fL (ref 80.0–100.0)
Monocytes Relative: 5 %
Neutro Abs: 4056 cells/uL (ref 1500–7800)
Platelets: 377 10*3/uL (ref 140–400)
RBC: 4.85 10*6/uL (ref 3.80–5.10)
RDW: 13.4 % (ref 11.0–15.0)
Total Lymphocyte: 41.6 %
WBC: 8 10*3/uL (ref 3.8–10.8)

## 2023-03-01 LAB — LIPID PANEL
Cholesterol: 251 mg/dL — ABNORMAL HIGH (ref <200)
HDL: 61 mg/dL (ref >=50)
LDL Cholesterol (Calc): 164 mg/dL (calc) — ABNORMAL HIGH
Non-HDL Cholesterol (Calc): 190 mg/dL (calc) — ABNORMAL HIGH (ref <130)
Total CHOL/HDL Ratio: 4.1 (calc) (ref <5.0)
Triglycerides: 127 mg/dL (ref <150)

## 2023-03-03 ENCOUNTER — Encounter: Payer: Self-pay | Admitting: Nurse Practitioner

## 2023-03-13 ENCOUNTER — Encounter: Payer: Self-pay | Admitting: Nurse Practitioner

## 2023-03-13 ENCOUNTER — Ambulatory Visit: Payer: Medicaid Other

## 2023-03-13 ENCOUNTER — Ambulatory Visit
Admission: RE | Admit: 2023-03-13 | Discharge: 2023-03-13 | Disposition: A | Discharge status: Home / Self Care | Payer: Medicaid Other | Source: Ambulatory Visit | Attending: Nurse Practitioner | Admitting: Nurse Practitioner

## 2023-03-13 DIAGNOSIS — N6011 Diffuse cystic mastopathy of right breast: Secondary | ICD-10-CM | POA: Diagnosis not present

## 2023-03-13 DIAGNOSIS — Z803 Family history of malignant neoplasm of breast: Secondary | ICD-10-CM | POA: Diagnosis not present

## 2023-03-13 DIAGNOSIS — N644 Mastodynia: Secondary | ICD-10-CM

## 2023-03-14 ENCOUNTER — Other Ambulatory Visit: Payer: Self-pay | Admitting: Radiology

## 2023-03-14 DIAGNOSIS — Z9189 Other specified personal risk factors, not elsewhere classified: Secondary | ICD-10-CM

## 2023-03-14 NOTE — Telephone Encounter (Signed)
Pt requests pre-procedure medication d/t anxiety. Please advise.

## 2023-03-14 NOTE — Telephone Encounter (Signed)
I put the order in.  Thanks 

## 2023-03-14 NOTE — Telephone Encounter (Signed)
TW patient. Due to her history we can order breast MRI and see if we can get insurance to cover it if that will help ease her nerves.

## 2023-03-14 NOTE — Telephone Encounter (Signed)
Once scheduled have her let us know and we can send something.

## 2023-03-17 NOTE — Telephone Encounter (Signed)
Per appt notes: "03/14/2023 1ST ATTEMPT LMOM // EPIC // [facility] [place] // LP"

## 2023-03-21 NOTE — Telephone Encounter (Signed)
Per appt notes: "03/21/2023 2ND ATTEMPT, PT STATES SHE IS DEALING WITH A DEATH IN THE FAMILY AND WILL CALL BACK TO SCHEDULE LP"

## 2023-04-07 NOTE — Telephone Encounter (Signed)
A msg was sent to referral coordinator to f/u w/ pt on neuro referral.

## 2023-04-08 NOTE — Telephone Encounter (Signed)
[facility] would be another option if she is willing to drive?

## 2023-04-16 NOTE — Telephone Encounter (Signed)
OK to refer to neuro for migraine per JC.   This location OK to send to. Thanks.  [email or web address]  Will also leave this encounter in my basket as a reminder to f/u on MRI.

## 2023-04-16 NOTE — Telephone Encounter (Signed)
Looks like a good options, thank you for researching that.

## 2023-06-04 ENCOUNTER — Ambulatory Visit (INDEPENDENT_AMBULATORY_CARE_PROVIDER_SITE_OTHER): Payer: MEDICAID | Admitting: Family Medicine

## 2023-06-04 ENCOUNTER — Encounter: Payer: Self-pay | Admitting: Family Medicine

## 2023-06-04 ENCOUNTER — Other Ambulatory Visit: Payer: Self-pay | Admitting: Nurse Practitioner

## 2023-06-04 VITALS — BP 168/95 | HR 84 | Ht 63.0 in | Wt 158.0 lb

## 2023-06-04 DIAGNOSIS — F419 Anxiety disorder, unspecified: Secondary | ICD-10-CM

## 2023-06-04 DIAGNOSIS — E782 Mixed hyperlipidemia: Secondary | ICD-10-CM | POA: Diagnosis not present

## 2023-06-04 DIAGNOSIS — L989 Disorder of the skin and subcutaneous tissue, unspecified: Secondary | ICD-10-CM

## 2023-06-04 DIAGNOSIS — R319 Hematuria, unspecified: Secondary | ICD-10-CM | POA: Diagnosis not present

## 2023-06-04 DIAGNOSIS — G43709 Chronic migraine without aura, not intractable, without status migrainosus: Secondary | ICD-10-CM

## 2023-06-04 DIAGNOSIS — F418 Other specified anxiety disorders: Secondary | ICD-10-CM

## 2023-06-04 DIAGNOSIS — I10 Essential (primary) hypertension: Secondary | ICD-10-CM

## 2023-06-04 DIAGNOSIS — K219 Gastro-esophageal reflux disease without esophagitis: Secondary | ICD-10-CM | POA: Diagnosis not present

## 2023-06-04 DIAGNOSIS — R7989 Other specified abnormal findings of blood chemistry: Secondary | ICD-10-CM

## 2023-06-04 DIAGNOSIS — R41840 Attention and concentration deficit: Secondary | ICD-10-CM

## 2023-06-04 LAB — URINALYSIS, ROUTINE W REFLEX MICROSCOPIC
Bilirubin Urine: NEGATIVE
Ketones, ur: 15 — AB
Leukocytes,Ua: NEGATIVE
Nitrite: NEGATIVE
Specific Gravity, Urine: 1.01 (ref 1.000–1.030)
Total Protein, Urine: NEGATIVE
Urine Glucose: NEGATIVE
Urobilinogen, UA: 0.2 (ref 0.0–1.0)
pH: 6 (ref 5.0–8.0)

## 2023-06-04 LAB — TSH: TSH: 1.55 u[IU]/mL (ref 0.35–5.50)

## 2023-06-04 LAB — COMPREHENSIVE METABOLIC PANEL
ALT: 258 U/L — ABNORMAL HIGH (ref 0–35)
AST: 212 U/L — ABNORMAL HIGH (ref 0–37)
Albumin: 4.7 g/dL (ref 3.5–5.2)
Alkaline Phosphatase: 81 U/L (ref 39–117)
BUN: 7 mg/dL (ref 6–23)
CO2: 27 meq/L (ref 19–32)
Calcium: 10.4 mg/dL (ref 8.4–10.5)
Chloride: 99 meq/L (ref 96–112)
Creatinine, Ser: 0.69 mg/dL (ref 0.40–1.20)
GFR: 102.98 mL/min (ref >60.00)
Glucose, Bld: 107 mg/dL — ABNORMAL HIGH (ref 70–99)
Potassium: 3.6 meq/L (ref 3.5–5.1)
Sodium: 137 meq/L (ref 135–145)
Total Bilirubin: 0.9 mg/dL (ref 0.2–1.2)
Total Protein: 8 g/dL (ref 6.0–8.3)

## 2023-06-04 LAB — LIPID PANEL
Cholesterol: 262 mg/dL — ABNORMAL HIGH (ref 0–200)
HDL: 58.3 mg/dL (ref >39.00)
LDL Cholesterol: 183 mg/dL — ABNORMAL HIGH (ref 0–99)
NonHDL: 203.3
Total CHOL/HDL Ratio: 4
Triglycerides: 102 mg/dL (ref 0.0–149.0)
VLDL: 20.4 mg/dL (ref 0.0–40.0)

## 2023-06-04 MED ORDER — DIAZEPAM 5 MG PO TABS: 5.0000 mg | ORAL_TABLET | ORAL | 0 refills | Status: DC

## 2023-06-04 NOTE — Assessment & Plan Note (Signed)
Patient currently on Adderall - prescribed by psychiatry

## 2023-06-04 NOTE — Patient Instructions (Addendum)
Blood pressure is not at goal for age and co-morbidities.   Recommendations: monitor at home, follow-up in 2 weeks for recheck. Likely start medication at that time if still elevated.  - BP goal <130/80 - monitor and log blood pressures at home - check around the same time each day in a relaxed setting - Limit salt to <2000 mg/day - Follow DASH eating plan (heart healthy diet) - limit alcohol to 2 standard drinks per day for men and 1 per day for women - avoid tobacco products - get at least 2 hours of regular aerobic exercise weekly Patient aware of signs/symptoms requiring further/urgent evaluation. Labs updated today.    Thank you for choosing [facility] at [facility] for your Primary Care needs. I am excited for the opportunity to partner with you to meet your health care goals. It was a pleasure meeting you today!  Information on diet, exercise, and health maintenance recommendations are listed below. This is information to help you be sure you are on track for optimal health and monitoring.   Please look over this and let us know if you have any questions or if you have completed any of the health maintenance outside of [facility] so that we can be sure your records are up to date.  ___________________________________________________________  MyChart:  For all urgent or time sensitive needs we ask that you please call the office to avoid delays. Our number is [phone]. MyChart is not constantly monitored and due to the large volume of messages a day, replies may take up to 72 business hours.  MyChart Policy: MyChart allows for you to see your visit notes, after visit summary, provider recommendations, lab and tests results, make an appointment, request refills, and contact your provider or the office for non-urgent questions or concerns. Providers are seeing patients during normal business hours and do not have built in time to review MyChart messages.  We  ask that you allow a minimum of 3 business days for responses to [employer]. For this reason, please do not send urgent requests through MyChart. Please call the office at [phone]. New and ongoing conditions may require a visit. We have virtual and in-person visits available for your convenience.  Complex MyChart concerns may require a visit. Your provider may request you schedule a virtual or in-person visit to ensure we are providing the best care possible. MyChart messages sent after 11:00 AM on Friday will not be received by the provider until Monday morning.    Lab and Test Results: You will receive your lab and test results on MyChart as soon as they are completed and results have been sent by the lab or testing facility. Due to this service, you will receive your results BEFORE your provider.  I review lab and test results each morning prior to seeing patients. Some results require collaboration with other providers to ensure you are receiving the most appropriate care. For this reason, we ask that you please allow a minimum of 3-5 business days from the time that ALL results have been received for your provider to receive and review lab and test results and contact you about these.  Most lab and test result comments from the provider will be sent through MyChart. Your provider may recommend changes to the plan of care, follow-up visits, repeat testing, ask questions, or request an office visit to discuss these results. You may reply directly to this message or call the office to provide information  for the provider or set up an appointment. In some instances, you will be called with test results and recommendations. Please let us know if this is preferred and we will make note of this in your chart to provide this for you.    If you have not heard a response to your lab or test results in 5 business days from all results returning to MyChart, please call the office to let us know. We ask  that you please avoid calling prior to this time unless there is an emergent concern. Due to high call volumes, this can delay the resulting process.  After Hours: For all non-emergency after hours needs, please call the office at [phone] and select the option to reach the on-call  service. On-call services are shared between multiple [facility] offices and therefore it will not be possible to speak directly with your provider. On-call providers may provide medical advice and recommendations, but are unable to provide refills for maintenance medications.  For all emergency or urgent medical needs after normal business hours, we recommend that you seek care at the closest Urgent Care or Emergency Department to ensure appropriate treatment in a timely manner.  [facility] has a 24 hour emergency room located on the ground floor for your convenience.   Urgent Concerns During the Business Day Providers are seeing patients from 8AM to 5PM with a busy schedule and are most often not able to respond to non-urgent calls until the end of the day or the next business day. If you should have URGENT concerns during the day, please call and speak to the nurse or schedule a same day appointment so that we can address your concern without delay.   Thank you, again, for choosing me as your health care partner. I appreciate your trust and look forward to learning more about you!   Lollie Marrow Reola Calkins, DNP, FNP-C  ___________________________________________________________  Health Maintenance Recommendations Screening Testing Mammogram Every 1-2 years based on history and risk factors Starting at age 53 Pap Smear Ages 21-39 every 3 years Ages 70-65 every 5 years with HPV testing More frequent testing may be required based on results and history Colon Cancer Screening Every 1-10 years based on test performed, risk factors, and history Starting at age 58 Bone Density Screening Every 2-10 years  based on history Starting at age 35 for women Recommendations for men differ based on medication usage, history, and risk factors AAA Screening One time ultrasound Men 47-87 years old who have ever smoked Lung Cancer Screening Low Dose Lung CT every 12 months Age 28-80 years with a 20 pack-year smoking history who still smoke or who have quit within the last 15 years  Screening Labs Routine  Labs: Complete Blood Count (CBC), Complete Metabolic Panel (CMP), Cholesterol (Lipid Panel) Every 6-12 months based on history and medications May be recommended more frequently based on current conditions or previous results Hemoglobin A1c Lab Every 3-12 months based on history and previous results Starting at age 100 or earlier with diagnosis of diabetes, high cholesterol, BMI >26, and/or risk factors Frequent monitoring for patients with diabetes to ensure blood sugar control Thyroid Panel  Every 6 months based on history, symptoms, and risk factors May be repeated more often if on medication HIV One time testing for all patients 21 and older May be repeated more frequently for patients with increased risk factors or exposure Hepatitis C One time testing for all patients 34 and older May be repeated  more frequently for patients with increased risk factors or exposure Gonorrhea, Chlamydia Every 12 months for all sexually active persons 13-24 years Additional monitoring may be recommended for those who are considered high risk or who have symptoms PSA Men 40-61 years old with risk factors Additional screening may be recommended from age 83-69 based on risk factors, symptoms, and history  Vaccine Recommendations Tetanus Booster All adults every 10 years Flu Vaccine All patients 6 months and older every year COVID Vaccine All patients 12 years and older Initial dosing with booster May recommend additional booster based on age and health history HPV Vaccine 2 doses all patients age  2-26 Dosing may be considered for patients over 26 Shingles Vaccine (Shingrix) 2 doses all adults 50 years and older Pneumonia (Pneumovax 61) All adults 65 years and older May recommend earlier dosing based on health history Pneumonia (Prevnar 23) All adults 65 years and older Dosed 1 year after Pneumovax 23 Pneumonia (Prevnar 20) All adults 65 years and older (adults 19-64 with certain conditions or risk factors) 1 dose  For those who have not received Prevnar 13 vaccine previously   Additional Screening, Testing, and Vaccinations may be recommended on an individualized basis based on family history, health history, risk factors, and/or exposure.  __________________________________________________________  Diet Recommendations for All Patients  I recommend that all patients maintain a diet low in saturated fats, carbohydrates, and cholesterol. While this can be challenging at first, it is not impossible and small changes can make big differences.  Things to try: Decreasing the amount of soda, sweet tea, and/or juice to one or less per day and replace with water While water is always the first choice, if you do not like water you may consider adding a water additive without sugar to improve the taste other sugar free drinks Replace potatoes with a brightly colored vegetable  Use healthy oils, such as canola oil or olive oil, instead of butter or hard margarine Limit your bread intake to two pieces or less a day Replace regular pasta with low carb pasta options Bake, broil, or grill foods instead of frying Monitor portion sizes  Eat smaller, more frequent meals throughout the day instead of large meals  An important thing to remember is, if you love foods that are not great for your health, you don't have to give them up completely. Instead, allow these foods to be a reward when you have done well. Allowing yourself to still have special treats every once in a while is a nice way to  tell yourself thank you for working hard to keep yourself healthy.   Also remember that every day is a new day. If you have a bad day and "fall off the wagon", you can still climb right back up and keep moving along on your journey!  We have resources available to help you!  Some websites that may be helpful include: www.http://www.wall-moore.info/  Www.VeryWellFit.com _____________________________________________________________  Activity Recommendations for All Patients  I recommend that all adults get at least 20 minutes of moderate physical activity that elevates your heart rate at least 5 days out of the week.  Some examples include: Walking or jogging at a pace that allows you to carry on a conversation Cycling (stationary bike or outdoors) Water aerobics Yoga Weight lifting Dancing If physical limitations prevent you from putting stress on your joints, exercise in a pool or seated in a chair are excellent options.  Do determine your MAXIMUM heart rate for activity: 220 -  YOUR AGE = MAX Heart Rate   Remember! Do not push yourself too hard.  Start slowly and build up your pace, speed, weight, time in exercise, etc.  Allow your body to rest between exercise and get good sleep. You will need more water than normal when you are exerting yourself. Do not wait until you are thirsty to drink. Drink with a purpose of getting in at least 8, 8 ounce glasses of water a day plus more depending on how much you exercise and sweat.    If you begin to develop dizziness, chest pain, abdominal pain, jaw pain, shortness of breath, headache, vision changes, lightheadedness, or other concerning symptoms, stop the activity and allow your body to rest. If your symptoms are severe, seek emergency evaluation immediately. If your symptoms are concerning, but not severe, please let us know so that we can recommend further evaluation.

## 2023-06-04 NOTE — Assessment & Plan Note (Signed)
Daily headaches, unresponsive to multiple treatments. - Referral to neurology for further evaluation and management previously placed. She is waiting for a call to schedule.

## 2023-06-04 NOTE — Assessment & Plan Note (Signed)
Hx of elevated lipids Repeat labs today

## 2023-06-04 NOTE — Assessment & Plan Note (Signed)
Blood pressure is not at goal for age and co-morbidities.   Recommendations: monitor at home, follow-up in 2 weeks for recheck. Likely start medication at that time if still elevated.  - BP goal <130/80 - monitor and log blood pressures at home - check around the same time each day in a relaxed setting - Limit salt to <2000 mg/day - Follow DASH eating plan (heart healthy diet) - limit alcohol to 2 standard drinks per day for men and 1 per day for women - avoid tobacco products - get at least 2 hours of regular aerobic exercise weekly Patient aware of signs/symptoms requiring further/urgent evaluation. Labs updated today.

## 2023-06-04 NOTE — Progress Notes (Signed)
New Patient Office Visit  Subjective    Patient ID: Jessica Molina, female    DOB: [birth year]  Age: 47 y.o. MRN: [id number]  CC:  Chief Complaint  Patient presents with   Establish Care    HPI Jessica Molina presents to establish care. Currently lives with her 2 daughters. She is separated from her husband currently.    Discussed the use of AI scribe software for clinical note transcription with the patient, who gave verbal consent to proceed.  History of Present Illness   The patient presents as a new patient due to a lapse in insurance coverage. She recently underwent a mammogram and ultrasound due to a family history of breast cancer, with an MRI pending. The patient's mother had breast cancer that was initially difficult to diagnose and eventually metastasized, leading to her passing. Patient is followed by Jessica Beady, NP with [facility].  The patient has been experiencing high blood pressure, which she has been monitoring at home. She reports that her blood pressure tends to decrease after taking Adderall, prescribed by her psychiatrist. 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 States she was following with nephrology in [place] initially and has seen [facility] several years ago, but nobody recently. She is not sure if she needs to continue follow-up.   She recently stopped taking Vilazodone, prescribed by her psychiatrist, due to concerns about high blood pressure and orange-colored urine. States her urine still has a tint to it, but she has not seen any obvious blood in the past week or so.    The patient has a history of migraines and is awaiting a referral to a neurologist from another provider. She is not currently on any migraine meds. 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   The patient has been experiencing stress due to personal issues, which she believes is  causing her to grind her teeth, leading to shifting and discomfort. She also reports a long-standing bump on her leg that has recently increased in size - her father did have melanoma and reports her mother had a similar lesion as hers, but she cannot recall a diagnosis.   The patient has a history of reflux, which is controlled by lifestyle modifications. She has undergone a hysterectomy and appendectomy, among other procedures. She has a family history of various cancers, including melanoma. The patient is currently not sexually active. She is on Adderall XR, Klonopin, and Phenergan as needed for nausea, and takes a daily multivitamin.            Outpatient Encounter Medications as of 06/04/2023  Medication Sig   amphetamine-dextroamphetamine (ADDERALL XR) 20 MG 24 hr capsule Take 20 mg by mouth daily.   clonazePAM (KLONOPIN) 1 MG tablet 1 TWICE DAILY AND 1-2 MG AT NIGHT (Patient taking differently: Take 0.5 mg by mouth at bedtime. 1 TWICE DAILY AND 1-2 MG AT NIGHT)   Multiple Vitamin (MULTIVITAMIN WITH MINERALS) TABS tablet Take 1 tablet by mouth daily.   promethazine (PHENERGAN) 25 MG tablet TAKE 1 TABLET BY MOUTH EVERY 6 HOURS AS NEEDED FOR NAUSEA OR VOMITING.   [DISCONTINUED] Atogepant (QULIPTA) 60 MG TABS Take 60 mg by mouth daily.   [DISCONTINUED] botulinum toxin Type A (BOTOX) 100 units SOLR injection Inject 200 Units into the muscle every 3 (three) months.   [DISCONTINUED] tiZANidine (ZANAFLEX) 4 MG tablet TAKE 1 TABLET (4 MG TOTAL)  BY MOUTH EVERY 8 (EIGHT) HOURS AS NEEDED FOR MUSCLE SPASMS   No facility-administered encounter medications on file as of 06/04/2023.    Past Medical History:  Diagnosis Date   Allergy    Aneurysm (HCC) 01/2015   Left carotid, repaired with no special restrictions or treatments to follow   Anxiety    GERD (gastroesophageal reflux disease)    Hypertension    Migraine    PONV (postoperative nausea and vomiting)    hard to wake up after appendectomy    Renal vein stenosis    "Kinked"    Past Surgical History:  Procedure Laterality Date   ABDOMINAL HYSTERECTOMY     ANEURYSM SURGERY  01/2015   APPENDECTOMY     BTL  2006   CESAREAN SECTION     Twins,  BTL   COLONOSCOPY     Over 20 years in [place]    CYSTOSCOPY N/A 06/25/2016   Procedure: CYSTOSCOPY;  Surgeon: Dara Lords, MD;  Location: [facility];  Service: Gynecology;  Laterality: N/A;   LAPAROSCOPIC VAGINAL HYSTERECTOMY WITH SALPINGECTOMY Bilateral 06/25/2016   Procedure: LAPAROSCOPIC ASSISTED VAGINAL HYSTERECTOMY WITH Bilateral SALPINGECTOMY, Lysis of Adhesions;  Surgeon: Dara Lords, MD;  Location: [facility];  Service: Gynecology;  Laterality: Bilateral;   RENAL ANGIOPLASTY     x2   TUBAL LIGATION      Family History  Problem Relation Age of Onset   Breast cancer Mother        breast    Thyroid disease Mother    Cancer Mother 9       breast   Hyperlipidemia Mother    Hypertension Father    Stroke Father    Melanoma Father        melanoma it is terminal    Cancer Father        melanoma   Breast cancer Maternal Grandmother        breast    Thyroid disease Maternal Grandmother    Cancer Maternal Grandfather        unknown    Cancer Paternal Grandmother        unknow    Colon cancer Neg Hx    Esophageal cancer Neg Hx    Esophageal varices Neg Hx    Stomach cancer Neg Hx     Social History   Socioeconomic History   Marital status: Married    Spouse name: Not on file   Number of children: 4   Years of education: HS+   Highest education level: Not on file  Occupational History   Occupation: Psychologist, sport and exercise  Tobacco Use   Smoking status: Never   Smokeless tobacco: Never  Vaping Use   Vaping status: Never Used  Substance and Sexual Activity   Alcohol use: Yes    Alcohol/week: 0.0 - 3.0 standard drinks of alcohol   Drug use: No   Sexual activity: Not Currently    Partners: Male    Birth control/protection: Surgical    Comment: hysterectomy  Other  Topics Concern   Not on file  Social History Narrative   Lives at home with husband and children.   Right-handed.   2-3 cups caffeine per week.   Social Determinants of Health   Financial Resource Strain: Not on file  Food Insecurity: Not on file  Transportation Needs: Not on file  Physical Activity: Not on file  Stress: Not on file  Social Connections: Not on file  Intimate Partner Violence: Not on file  ROS All review of systems negative except what is listed in the HPI      Objective    BP (!) 168/95   Pulse 84   Ht 5\' 3"  (1.6 m)   Wt 158 lb (71.7 kg)   LMP 06/19/2016 (Exact Date)   SpO2 100%   BMI 27.99 kg/m   Physical Exam Vitals reviewed.  Constitutional:      Appearance: Normal appearance.  HENT:     Head: Normocephalic and atraumatic.  Cardiovascular:     Rate and Rhythm: Normal rate and regular rhythm.     Heart sounds: Normal heart sounds.  Pulmonary:     Effort: Pulmonary effort is normal.     Breath sounds: Normal breath sounds.  Skin:    General: Skin is warm and dry.     Comments: ~1cm raised, smooth lesion to left lower anterior leg  Neurological:     Mental Status: She is alert and oriented to person, place, and time.  Psychiatric:        Mood and Affect: Mood normal.        Behavior: Behavior normal.        Thought Content: Thought content normal.        Judgment: Judgment normal.          Assessment & Plan:   Problem List Items Addressed This Visit       Active Problems   Essential hypertension - Primary    Blood pressure is not at goal for age and co-morbidities.   Recommendations: monitor at home, follow-up in 2 weeks for recheck. Likely start medication at that time if still elevated.  - BP goal <130/80 - monitor and log blood pressures at home - check around the same time each day in a relaxed setting - Limit salt to <2000 mg/day - Follow DASH eating plan (heart healthy diet) - limit alcohol to 2 standard drinks per  day for men and 1 per day for women - avoid tobacco products - get at least 2 hours of regular aerobic exercise weekly Patient aware of signs/symptoms requiring further/urgent evaluation. Labs updated today.      Relevant Orders   Comprehensive metabolic panel   TSH   Chronic migraine without aura without status migrainosus, not intractable    Daily headaches, unresponsive to multiple treatments. - Referral to neurology for further evaluation and management previously placed. She is waiting for a call to schedule.      GERD (gastroesophageal reflux disease)    Well controlled with lifestyle measures       Depression with anxiety    Following with psychiatry  No SI/HI      Mixed hyperlipidemia    Hx of elevated lipids Repeat labs today       Relevant Orders   Comprehensive metabolic panel   Lipid panel   Difficulty concentrating    Patient currently on Adderall - prescribed by psychiatry       Other Visit Diagnoses     Skin lesion     Rapidly growing. Refer to dermatology.     Relevant Orders   Ambulatory referral to Dermatology   Hematuria, unspecified type    Patient reports orange urine after starting Vilazodone by [facility] provider, which has since been discontinued. - Collect labs and urine sample for microscopic examination. - Monitor for recurrence of symptoms.     Relevant Orders   Urinalysis, Routine w reflex microscopic       Return in about  2 weeks (around 06/18/2023) for HTN follow-up.   Clayborne Dana, NP

## 2023-06-04 NOTE — Assessment & Plan Note (Signed)
Well controlled with lifestyle measures

## 2023-06-04 NOTE — Assessment & Plan Note (Signed)
Following with psychiatry  No SI/HI

## 2023-06-04 NOTE — Addendum Note (Signed)
Addended by: Hyman Hopes B on: 06/04/2023 04:19 PM   Modules accepted: Orders

## 2023-06-04 NOTE — Progress Notes (Signed)
Your liver enzymes are quite high compared to the last time someone checked them. I'd like to recheck next week - schedule a lab appointment. Please come fasting and avoid tylenol and alcohol in the meantime.  Cholesterol is quite high. Overall risk is low (ASCVD score below), but if LDL gets above 190, we really need to consider starting medication. For now, start taking a daily Omega-3 supplement and focus on lifestyle measures below. We can recheck in 6 weeks or so.    The 10-year ASCVD risk score (Arnett DK, et al., 2019) is: 2.3%   Values used to calculate the score:     Age: 47 years     Sex: Female     Is Non-Hispanic African American: No     Diabetic: No     Tobacco smoker: No     Systolic Blood Pressure: 168 mmHg     Is BP treated: No     HDL Cholesterol: 58.3 mg/dL     Total Cholesterol: 262 mg/dL

## 2023-06-17 NOTE — Telephone Encounter (Signed)
FYI.  CC: BLV for referral f/u.

## 2023-06-18 ENCOUNTER — Encounter: Payer: Self-pay | Admitting: Family Medicine

## 2023-06-18 ENCOUNTER — Ambulatory Visit: Payer: MEDICAID | Admitting: Family Medicine

## 2023-06-18 VITALS — BP 170/90 | HR 72 | Ht 63.0 in | Wt 153.0 lb

## 2023-06-18 DIAGNOSIS — R7989 Other specified abnormal findings of blood chemistry: Secondary | ICD-10-CM | POA: Diagnosis not present

## 2023-06-18 DIAGNOSIS — I10 Essential (primary) hypertension: Secondary | ICD-10-CM

## 2023-06-18 DIAGNOSIS — J029 Acute pharyngitis, unspecified: Secondary | ICD-10-CM

## 2023-06-18 LAB — COMPREHENSIVE METABOLIC PANEL WITH GFR
ALT: 83 U/L — ABNORMAL HIGH (ref 0–35)
AST: 41 U/L — ABNORMAL HIGH (ref 0–37)
Albumin: 4.6 g/dL (ref 3.5–5.2)
Alkaline Phosphatase: 66 U/L (ref 39–117)
BUN: 8 mg/dL (ref 6–23)
CO2: 26 meq/L (ref 19–32)
Calcium: 10.1 mg/dL (ref 8.4–10.5)
Chloride: 105 meq/L (ref 96–112)
Creatinine, Ser: 0.81 mg/dL (ref 0.40–1.20)
GFR: 86.11 mL/min (ref >60.00)
Glucose, Bld: 93 mg/dL (ref 70–99)
Potassium: 4.5 meq/L (ref 3.5–5.1)
Sodium: 138 meq/L (ref 135–145)
Total Bilirubin: 0.4 mg/dL (ref 0.2–1.2)
Total Protein: 7.1 g/dL (ref 6.0–8.3)

## 2023-06-18 LAB — POCT RAPID STREP A (OFFICE): Rapid Strep A Screen: NEGATIVE

## 2023-06-18 MED ORDER — HYDROCHLOROTHIAZIDE 12.5 MG PO TABS
12.5000 mg | ORAL_TABLET | Freq: Every day | ORAL | 3 refills | Status: DC
Start: 2023-06-18 — End: 2023-12-01

## 2023-06-18 NOTE — Patient Instructions (Signed)
Blood pressure is not at goal for age and co-morbidities.   Recommendations: start HCTZ 12.5 mg daily - BP goal <130/80 - monitor and log blood pressures at home - check around the same time each day in a relaxed setting - Limit salt to <2000 mg/day - Follow DASH eating plan (heart healthy diet) - limit alcohol to 2 standard drinks per day for men and 1 per day for women - avoid tobacco products - get at least 2 hours of regular aerobic exercise weekly Patient aware of signs/symptoms requiring further/urgent evaluation. Labs updated today.

## 2023-06-18 NOTE — Assessment & Plan Note (Signed)
Blood pressure is not at goal for age and co-morbidities.   Recommendations: start HCTZ 12.5 mg daily - BP goal <130/80 - monitor and log blood pressures at home - check around the same time each day in a relaxed setting - Limit salt to <2000 mg/day - Follow DASH eating plan (heart healthy diet) - limit alcohol to 2 standard drinks per day for men and 1 per day for women - avoid tobacco products - get at least 2 hours of regular aerobic exercise weekly Patient aware of signs/symptoms requiring further/urgent evaluation. Labs updated today.

## 2023-06-18 NOTE — Progress Notes (Signed)
Established Patient Office Visit  Subjective   Patient ID: Jessica Molina, female    DOB: 1976-02-10  Age: 47 y.o. MRN: [id number]  Chief Complaint  Patient presents with   Hypertension    HPI   Discussed the use of AI scribe software for clinical note transcription with the patient, who gave verbal consent to proceed.  History of Present Illness   The patient, with a history of hypertension, presents with persistently elevated blood pressure readings at home, ranging from 144/100 to 172/110. They report a history of intermittent hypertension and are accustomed to these high readings. They have been experiencing headaches and vision changes, including difficulty focusing, which they suspect may be related to their hypertension. They have recently purchased reading glasses due to worsening vision.  In addition to hypertension, the patient reports a sore throat and difficulty swallowing for over a week. They describe their lymph nodes as feeling swollen and tender. They deny being around anyone who has been sick recently.  The patient has made lifestyle changes, including complete cessation of alcohol consumption following abnormal liver enzyme results. They have also been slowly increasing their physical activity by using a treadmill, after recovering from a toe injury. They are currently not on any blood pressure medications.  The patient also has a prescription for Adderall, which they have been taking for almost a year. They report a history of being on a combination of losartan and hydrochlorothiazide for blood pressure control, which they could adjust based on their blood pressure readings. They have had experiences with their blood pressure dropping too low on certain medications in the past.           ROS All review of systems negative except what is listed in the HPI    Objective:     BP (!) 170/90   Pulse 72   Ht 5\' 3"  (1.6 m)   Wt 153 lb (69.4 kg)   LMP 06/19/2016  (Exact Date)   SpO2 100%   BMI 27.10 kg/m    Physical Exam Vitals reviewed.  Constitutional:      Appearance: Normal appearance.  HENT:     Head: Normocephalic and atraumatic.     Mouth/Throat:     Mouth: Mucous membranes are moist.     Pharynx: Oropharynx is clear. Posterior oropharyngeal erythema present. No oropharyngeal exudate.  Neck:     Comments: Mild lymphadenopathy to anterior cervical chain Cardiovascular:     Rate and Rhythm: Normal rate and regular rhythm.     Heart sounds: Normal heart sounds.  Pulmonary:     Effort: Pulmonary effort is normal.     Breath sounds: Normal breath sounds.  Skin:    General: Skin is warm and dry.  Neurological:     Mental Status: She is alert and oriented to person, place, and time.  Psychiatric:        Mood and Affect: Mood normal.        Behavior: Behavior normal.        Thought Content: Thought content normal.        Judgment: Judgment normal.      Results for orders placed or performed in visit on 06/18/23  POCT rapid strep A  Result Value Ref Range   Rapid Strep A Screen Negative Negative      The 10-year ASCVD risk score (Arnett DK, et al., 2019) is: 3.2%    Assessment & Plan:   Problem List Items Addressed This Visit  Active Problems   Essential hypertension    Blood pressure is not at goal for age and co-morbidities.   Recommendations: start HCTZ 12.5 mg daily - BP goal <130/80 - monitor and log blood pressures at home - check around the same time each day in a relaxed setting - Limit salt to <2000 mg/day - Follow DASH eating plan (heart healthy diet) - limit alcohol to 2 standard drinks per day for men and 1 per day for women - avoid tobacco products - get at least 2 hours of regular aerobic exercise weekly Patient aware of signs/symptoms requiring further/urgent evaluation. Labs updated today.      Relevant Medications   hydrochlorothiazide (HYDRODIURIL) 12.5 MG tablet   Other Visit  Diagnoses     Sore throat    -  Primary Strep test negative.  Recommend continuing supportive measures for now - rest, hydration, warm liquids with honey, salt water gargles, tylenol, etc.  Please contact office for follow-up if symptoms do not improve or worsen. Seek emergency care if symptoms become severe.    Relevant Orders   POCT rapid strep A       Return in about 2 weeks (around 07/02/2023) for HTN follow-up 2-4 weeks .    Clayborne Dana, NP

## 2023-06-18 NOTE — Telephone Encounter (Signed)
Jessica Molina -please f/u with patient to confirm location of neurology referral and advise if new order is needed. See previous messages.

## 2023-06-30 ENCOUNTER — Encounter: Payer: Self-pay | Admitting: Nurse Practitioner

## 2023-06-30 NOTE — Telephone Encounter (Signed)
Jessica Molina -please contact patient and assist with referral.

## 2023-06-30 NOTE — Telephone Encounter (Signed)
Call placed to patient, left detailed message, ok per dpr. Advised we can make the referral and recommendations; however, we do not know who is covered under your plan. Once you determine which providers are covered under your plan, we can then assist with placing the referral to the location of your choice. You may return a call to [phone] with any additional questions or where you want that referral placed. You can also send that information by MyChart.

## 2023-07-02 ENCOUNTER — Encounter: Payer: Self-pay | Admitting: Nurse Practitioner

## 2023-07-06 ENCOUNTER — Other Ambulatory Visit: Payer: MEDICAID

## 2023-07-09 ENCOUNTER — Ambulatory Visit: Payer: MEDICAID | Admitting: Family Medicine

## 2023-08-07 ENCOUNTER — Inpatient Hospital Stay: Admission: RE | Admit: 2023-08-07 | Payer: MEDICAID | Source: Ambulatory Visit

## 2023-09-09 DIAGNOSIS — F411 Generalized anxiety disorder: Secondary | ICD-10-CM | POA: Diagnosis not present

## 2023-09-16 DIAGNOSIS — F411 Generalized anxiety disorder: Secondary | ICD-10-CM | POA: Diagnosis not present

## 2023-10-07 DIAGNOSIS — F4312 Post-traumatic stress disorder, chronic: Secondary | ICD-10-CM | POA: Diagnosis not present

## 2023-10-07 DIAGNOSIS — F331 Major depressive disorder, recurrent, moderate: Secondary | ICD-10-CM | POA: Diagnosis not present

## 2023-10-07 DIAGNOSIS — F418 Other specified anxiety disorders: Secondary | ICD-10-CM | POA: Diagnosis not present

## 2023-10-08 DIAGNOSIS — F411 Generalized anxiety disorder: Secondary | ICD-10-CM | POA: Diagnosis not present

## 2023-10-10 DIAGNOSIS — F411 Generalized anxiety disorder: Secondary | ICD-10-CM | POA: Diagnosis not present

## 2023-10-14 DIAGNOSIS — F411 Generalized anxiety disorder: Secondary | ICD-10-CM | POA: Diagnosis not present

## 2023-10-28 DIAGNOSIS — F411 Generalized anxiety disorder: Secondary | ICD-10-CM | POA: Diagnosis not present

## 2023-11-19 DIAGNOSIS — F411 Generalized anxiety disorder: Secondary | ICD-10-CM | POA: Diagnosis not present

## 2023-11-25 DIAGNOSIS — F411 Generalized anxiety disorder: Secondary | ICD-10-CM | POA: Diagnosis not present

## 2023-12-01 ENCOUNTER — Ambulatory Visit (INDEPENDENT_AMBULATORY_CARE_PROVIDER_SITE_OTHER): Admitting: Family Medicine

## 2023-12-01 VITALS — BP 149/81 | HR 88 | Ht 63.0 in | Wt 140.0 lb

## 2023-12-01 DIAGNOSIS — R519 Headache, unspecified: Secondary | ICD-10-CM | POA: Diagnosis not present

## 2023-12-01 DIAGNOSIS — I10 Essential (primary) hypertension: Secondary | ICD-10-CM

## 2023-12-01 MED ORDER — OXYCODONE-ACETAMINOPHEN 5-325 MG PO TABS: 1.0000 | ORAL_TABLET | Freq: Four times a day (QID) | ORAL | 0 refills | Status: DC | PRN

## 2023-12-01 MED ORDER — CARBAMAZEPINE ER 100 MG PO TB12
100.0000 mg | ORAL_TABLET | Freq: Two times a day (BID) | ORAL | 1 refills | Status: DC
Start: 2023-12-01 — End: 2023-12-23

## 2023-12-01 MED ORDER — HYDROCHLOROTHIAZIDE 25 MG PO TABS: 25.0000 mg | ORAL_TABLET | Freq: Every day | ORAL | 1 refills | Status: DC

## 2023-12-01 NOTE — Assessment & Plan Note (Signed)
 Blood pressure is not at goal for age and co-morbidities.   Recommendations: increase to HCTZ 25 mg daily, nurse visit for recheck in 2-4 weeks - BP goal <130/80 - monitor and log blood pressures at home - check around the same time each day in a relaxed setting - Limit salt to <2000 mg/day - Follow DASH eating plan (heart healthy diet) - limit alcohol to 2 standard drinks per day for men and 1 per day for women - avoid tobacco products - get at least 2 hours of regular aerobic exercise weekly Patient aware of signs/symptoms requiring further/urgent evaluation.

## 2023-12-01 NOTE — Progress Notes (Signed)
 Acute Office Visit  Subjective:     Patient ID: Jessica Molina, female    DOB: 23-Aug-1976, 48 y.o.   MRN: [id number]  Chief Complaint  Patient presents with   Facial Pain    HPI Patient is in today for left-sided facial pain.   Discussed the use of AI scribe software for clinical note transcription with the patient, who gave verbal consent to proceed.  History of Present Illness Jessica Molina is a 48 year old female who presents with intermittent severe left-sided facial pain and numbness.  She experiences severe left-sided facial pain described as stabbing, with associated numbness and a sensation of swelling in her mouth, though no visible swelling. The pain radiates down her face, sometimes extending into her jaw and forehead, and is accompanied by a sensation of ice on her face and headaches. These episodes have increased in frequency and severity over the past few months, occurring multiple times a day and lasting about an hour. The pain is severe enough to cause her to curl up on the floor. It is exacerbated by talking and chewing, leading to difficulty eating, with her managing to eat only a few things recently. During severe episodes, her speech can become slurred, and she feels like she is mumbling.  She has tried over-the-counter medications such as Advil and Motrin without relief and avoids Tylenol due to a mild reaction. She has seen a dentist who ruled out dental issues, including TMJ and cavities. The pain is consistently on the left side and does not cross to the right. Occasionally, it causes her vision to become fuzzy. She has a history of migraines and has previously seen a neurologist, but feels that her current symptoms are more severe than her past migraines. She has not been able to secure a new neurology appointment due to insurance issues.     ROS All review of systems negative except what is listed in the HPI      Objective:    BP (!) 149/81   Pulse 88    Ht 5\' 3"  (1.6 m)   Wt 140 lb (63.5 kg)   LMP 06/19/2016 (Exact Date)   SpO2 100%   BMI 24.80 kg/m    Physical Exam Vitals reviewed.  Constitutional:      Appearance: Normal appearance.  HENT:     Head: Normocephalic and atraumatic.     Right Ear: Tympanic membrane normal.     Left Ear: Tympanic membrane normal.  Cardiovascular:     Rate and Rhythm: Normal rate and regular rhythm.     Heart sounds: Normal heart sounds.  Pulmonary:     Effort: Pulmonary effort is normal.     Breath sounds: Normal breath sounds.  Lymphadenopathy:     Cervical: No cervical adenopathy.  Skin:    General: Skin is warm and dry.  Neurological:     General: No focal deficit present.     Mental Status: She is alert and oriented to person, place, and time. Mental status is at baseline.     Cranial Nerves: No cranial nerve deficit.     Motor: No weakness.  Psychiatric:        Mood and Affect: Mood normal.        Behavior: Behavior normal.        Thought Content: Thought content normal.        Judgment: Judgment normal.     No results found for any visits on 12/01/23.  Assessment & Plan:   Problem List Items Addressed This Visit       Active Problems   Essential hypertension   Blood pressure is not at goal for age and co-morbidities.   Recommendations: increase to HCTZ 25 mg daily, nurse visit for recheck in 2-4 weeks - BP goal <130/80 - monitor and log blood pressures at home - check around the same time each day in a relaxed setting - Limit salt to <2000 mg/day - Follow DASH eating plan (heart healthy diet) - limit alcohol to 2 standard drinks per day for men and 1 per day for women - avoid tobacco products - get at least 2 hours of regular aerobic exercise weekly Patient aware of signs/symptoms requiring further/urgent evaluation.       Relevant Medications   hydrochlorothiazide (HYDRODIURIL) 25 MG tablet   Other Visit Diagnoses       Left facial pain    -   Primary Suspected Trigeminal Neuralgia Severe intermittent left-sided facial pain, numbness, and headaches. Pain is triggered by talking and chewing. Episodes are increasing in frequency and intensity. Dental issues have been ruled out. -Start Tegretol 100 mg twice daily, with potential to increase to two tablets twice daily if tolerated. -Order MRI to identify potential causes of irritation. -Refer to neurology for further evaluation and management. -Provide Percocet for severe pain episodes, with caution advised regarding driving.   Relevant Medications   carbamazepine (TEGRETOL XR) 100 MG 12 hr tablet   oxyCODONE-acetaminophen (PERCOCET) 5-325 MG tablet   Other Relevant Orders   Ambulatory referral to Neurology   MR FACE/TRIGEMINAL WO/W CM          Meds ordered this encounter  Medications   carbamazepine (TEGRETOL XR) 100 MG 12 hr tablet    Sig: Take 1 tablet (100 mg total) by mouth 2 (two) times daily. Can increase to 2 tablets twice daily if symptoms not improving.    Dispense:  90 tablet    Refill:  1    Supervising Provider:   Danise Edge A [[id number]]   hydrochlorothiazide (HYDRODIURIL) 25 MG tablet    Sig: Take 1 tablet (25 mg total) by mouth daily.    Dispense:  90 tablet    Refill:  1    Supervising Provider:   Danise Edge A [[id number]]   oxyCODONE-acetaminophen (PERCOCET) 5-325 MG tablet    Sig: Take 1-2 tablets by mouth every 6 (six) hours as needed for severe pain (pain score 7-10). Use sparingly to avoid tolerance/dependence    Dispense:  15 tablet    Refill:  0    Supervising Provider:   Danise Edge A [[id number]]    Return in about 2 weeks (around 12/15/2023) for BP check with nurse.  Clayborne Dana, NP

## 2023-12-02 DIAGNOSIS — F411 Generalized anxiety disorder: Secondary | ICD-10-CM | POA: Diagnosis not present

## 2023-12-04 ENCOUNTER — Encounter: Payer: Self-pay | Admitting: Family Medicine

## 2023-12-04 DIAGNOSIS — R519 Headache, unspecified: Secondary | ICD-10-CM

## 2023-12-05 MED ORDER — OXYCODONE-ACETAMINOPHEN 5-325 MG PO TABS: 1.0000 | ORAL_TABLET | Freq: Four times a day (QID) | ORAL | 0 refills | Status: DC | PRN

## 2023-12-08 ENCOUNTER — Telehealth: Payer: Self-pay | Admitting: *Deleted

## 2023-12-08 MED ORDER — OXYCODONE-ACETAMINOPHEN 5-325 MG PO TABS: 1.0000 | ORAL_TABLET | Freq: Four times a day (QID) | ORAL | 0 refills | Status: DC | PRN

## 2023-12-08 NOTE — Telephone Encounter (Signed)
 Cancel the [facility] Rx for Percocet. I'm resending to [facility] instead. PDMP reviewed.

## 2023-12-08 NOTE — Addendum Note (Signed)
 Addended by: Clayborne Dana on: 12/08/2023 09:38 AM   Modules accepted: Orders

## 2023-12-08 NOTE — Telephone Encounter (Signed)
 Called and LVM to cancel RX in [place].

## 2023-12-08 NOTE — Telephone Encounter (Signed)
 Copied from CRM [id number]. Topic: General - Other >> Dec 08, 2023 12:04 PM Truddie Crumble wrote: Reason for CRM: [facility] from [facility] called stating they was told to cancel a prescription for oxycodone but it was already picked up CB [phone]

## 2023-12-08 NOTE — Telephone Encounter (Signed)
 Called the [facility] and they cancelled their Percocet Rx.

## 2023-12-09 ENCOUNTER — Ambulatory Visit: Admitting: Neurology

## 2023-12-09 ENCOUNTER — Encounter (INDEPENDENT_AMBULATORY_CARE_PROVIDER_SITE_OTHER): Payer: Self-pay | Admitting: Neurology

## 2023-12-09 ENCOUNTER — Encounter: Payer: Self-pay | Admitting: Neurology

## 2023-12-09 VITALS — BP 138/85 | HR 78 | Ht 63.0 in | Wt 143.0 lb

## 2023-12-09 DIAGNOSIS — G43709 Chronic migraine without aura, not intractable, without status migrainosus: Secondary | ICD-10-CM

## 2023-12-09 DIAGNOSIS — R519 Headache, unspecified: Secondary | ICD-10-CM

## 2023-12-09 MED ORDER — DULOXETINE HCL 30 MG PO CPEP
30.0000 mg | ORAL_CAPSULE | Freq: Every day | ORAL | 5 refills | Status: DC
Start: 2023-12-09 — End: 2023-12-09

## 2023-12-09 MED ORDER — AJOVY 225 MG/1.5ML ~~LOC~~ SOAJ: 225.0000 mg | SUBCUTANEOUS | 11 refills | Status: DC

## 2023-12-09 MED ORDER — NURTEC 75 MG PO TBDP: ORAL_TABLET | ORAL | 11 refills | Status: DC

## 2023-12-09 NOTE — Addendum Note (Signed)
 Addended by: Levert Feinstein on: 12/09/2023 03:26 PM   Modules accepted: Orders
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
## 2023-12-10 ENCOUNTER — Telehealth: Payer: Self-pay | Admitting: Neurology

## 2023-12-10 DIAGNOSIS — F411 Generalized anxiety disorder: Secondary | ICD-10-CM | POA: Diagnosis not present

## 2023-12-10 NOTE — Telephone Encounter (Signed)
 Referral sent to [facility]; phone #: [phone], fax#: [phone].

## 2023-12-11 ENCOUNTER — Telehealth: Payer: Self-pay

## 2023-12-11 ENCOUNTER — Other Ambulatory Visit (HOSPITAL_COMMUNITY): Payer: Self-pay

## 2023-12-11 MED ORDER — NURTEC 75 MG PO TBDP: ORAL_TABLET | ORAL | 0 refills | Status: DC

## 2023-12-11 NOTE — Telephone Encounter (Signed)
 NURTEC pa needed

## 2023-12-11 NOTE — Telephone Encounter (Signed)
 Call to patient after receiving my chart messages. Patient reports pain in left side of face, numbness, and slurred speech. She reports migraines associated with this at times. She saw Dr. Terrace Arabia last week and  Has MRI scheduled for next Thursday. She reports pian, numbness, slurred speech and weight loss due t being unable to eat. Advised that some of those symptoms are stroke symptoms and although not acute, have been persistent over time, she should get evaluated at ER. I also advised we would  try and request sooner MRI appointment.  Discussed this with Maralyn Sago, NP and Delorise Jackson

## 2023-12-11 NOTE — Telephone Encounter (Signed)
 Pharmacy Patient Advocate Encounter   Received notification from Pt Calls Messages that prior authorization for Nurtec 75MG  dispersible tablets is required/requested.   Insurance verification completed.   The patient is insured through [facility] .   Prior Authorization for Nurtec 75MG  dispersible tablets has been APPROVED from 12-11-2023 to 12-10-2024   PA #/Case ID/Reference #: [id number]

## 2023-12-11 NOTE — Telephone Encounter (Signed)
 PA request has been Approved. New Encounter has been or will be created for follow up. For additional info see Pharmacy Prior Auth telephone encounter from 12-11-2023.

## 2023-12-12 ENCOUNTER — Ambulatory Visit
Admission: RE | Admit: 2023-12-12 | Discharge: 2023-12-12 | Disposition: A | Discharge status: Home / Self Care | Source: Ambulatory Visit | Attending: Family Medicine | Admitting: Family Medicine

## 2023-12-12 DIAGNOSIS — R519 Headache, unspecified: Secondary | ICD-10-CM | POA: Diagnosis not present

## 2023-12-12 DIAGNOSIS — H538 Other visual disturbances: Secondary | ICD-10-CM | POA: Diagnosis not present

## 2023-12-12 MED ORDER — GADOPICLENOL 0.5 MMOL/ML IV SOLN
6.5000 mL | Freq: Once | INTRAVENOUS | Status: AC | PRN
  Administered 2023-12-12: 6.5 mL via INTRAVENOUS

## 2023-12-15 ENCOUNTER — Other Ambulatory Visit: Payer: Self-pay | Admitting: Family Medicine

## 2023-12-15 DIAGNOSIS — R519 Headache, unspecified: Secondary | ICD-10-CM

## 2023-12-15 MED ORDER — OXYCODONE-ACETAMINOPHEN 5-325 MG PO TABS: 1.0000 | ORAL_TABLET | Freq: Four times a day (QID) | ORAL | 0 refills | Status: DC | PRN

## 2023-12-16 NOTE — Telephone Encounter (Signed)
 Please see the MyChart message reply(ies) for my assessment and plan.    This patient gave consent for this Medical Advice Message and is aware that it may result in a bill to [employer], as well as the possibility of receiving a bill for a co-payment or deductible. They are an established patient, but are not seeking medical advice exclusively about a problem treated during an in person or video visit in the last seven days. I did not recommend an in person or video visit within seven days of my reply.    I spent a total of 5 minutes cumulative time within 7 days through [employer].  Levert Feinstein, MD

## 2023-12-18 DIAGNOSIS — F411 Generalized anxiety disorder: Secondary | ICD-10-CM | POA: Diagnosis not present

## 2023-12-19 ENCOUNTER — Other Ambulatory Visit

## 2023-12-19 ENCOUNTER — Ambulatory Visit

## 2023-12-22 DIAGNOSIS — G43709 Chronic migraine without aura, not intractable, without status migrainosus: Secondary | ICD-10-CM | POA: Diagnosis not present

## 2023-12-22 NOTE — Telephone Encounter (Signed)
 Please see the MyChart message reply(ies) for my assessment and plan.    This patient gave consent for this Medical Advice Message and is aware that it may result in a bill to [employer], as well as the possibility of receiving a bill for a co-payment or deductible. They are an established patient, but are not seeking medical advice exclusively about a problem treated during an in person or video visit in the last seven days. I did not recommend an in person or video visit within seven days of my reply.    I spent a total of 5 minutes cumulative time within 7 days through [employer].  Levert Feinstein, MD

## 2023-12-23 DIAGNOSIS — F411 Generalized anxiety disorder: Secondary | ICD-10-CM | POA: Diagnosis not present

## 2023-12-23 MED ORDER — GABAPENTIN 300 MG PO CAPS: 300.0000 mg | ORAL_CAPSULE | Freq: Three times a day (TID) | ORAL | 0 refills | Status: DC

## 2023-12-23 MED ORDER — OXYCODONE-ACETAMINOPHEN 5-325 MG PO TABS
1.0000 | ORAL_TABLET | Freq: Four times a day (QID) | ORAL | 0 refills | Status: DC | PRN
Start: 2023-12-23 — End: 2024-01-02

## 2023-12-23 NOTE — Addendum Note (Signed)
 Addended by: Hyman Hopes B on: 12/23/2023 11:32 AM   Modules accepted: Orders

## 2023-12-23 NOTE — Addendum Note (Signed)
 Addended by: Hyman Hopes B on: 12/23/2023 11:34 AM   Modules accepted: Orders

## 2023-12-23 NOTE — Addendum Note (Signed)
 Addended by: Hyman Hopes B on: 12/23/2023 01:53 PM   Modules accepted: Orders

## 2023-12-24 ENCOUNTER — Encounter: Payer: Self-pay | Admitting: Family Medicine

## 2023-12-24 DIAGNOSIS — R519 Headache, unspecified: Secondary | ICD-10-CM

## 2023-12-24 DIAGNOSIS — R930 Abnormal findings on diagnostic imaging of skull and head, not elsewhere classified: Secondary | ICD-10-CM

## 2023-12-24 DIAGNOSIS — J029 Acute pharyngitis, unspecified: Secondary | ICD-10-CM

## 2023-12-24 MED ORDER — BACLOFEN 10 MG PO TABS: 5.0000 mg | ORAL_TABLET | Freq: Three times a day (TID) | ORAL | 0 refills | Status: DC

## 2023-12-24 NOTE — Addendum Note (Signed)
 Addended by: Hyman Hopes B on: 12/24/2023 11:41 AM   Modules accepted: Orders

## 2023-12-24 NOTE — Telephone Encounter (Signed)
 Contacted [facility] to check in status of referral for pain clinic. Left voicemail for referral Coordinator, Sherrie to call [facility] back with status of referral.

## 2023-12-25 ENCOUNTER — Encounter: Payer: Self-pay | Admitting: Neurology

## 2023-12-25 NOTE — Telephone Encounter (Signed)
 Jessica Molina returned phone call, not in network with [facility]. Refaxed to [facility]. Phone: [phone], Fax: [phone]

## 2023-12-30 DIAGNOSIS — F4312 Post-traumatic stress disorder, chronic: Secondary | ICD-10-CM | POA: Diagnosis not present

## 2023-12-30 DIAGNOSIS — F411 Generalized anxiety disorder: Secondary | ICD-10-CM | POA: Diagnosis not present

## 2023-12-30 DIAGNOSIS — F418 Other specified anxiety disorders: Secondary | ICD-10-CM | POA: Diagnosis not present

## 2023-12-30 DIAGNOSIS — F331 Major depressive disorder, recurrent, moderate: Secondary | ICD-10-CM | POA: Diagnosis not present

## 2024-01-02 MED ORDER — OXYCODONE-ACETAMINOPHEN 5-325 MG PO TABS: 1.0000 | ORAL_TABLET | Freq: Four times a day (QID) | ORAL | 0 refills | Status: DC | PRN

## 2024-01-02 NOTE — Addendum Note (Signed)
 Addended by: Hyman Hopes B on: 01/02/2024 09:31 AM   Modules accepted: Orders

## 2024-01-02 NOTE — Addendum Note (Signed)
 Addended by: Hyman Hopes B on: 01/02/2024 10:31 AM   Modules accepted: Orders

## 2024-01-06 DIAGNOSIS — F411 Generalized anxiety disorder: Secondary | ICD-10-CM | POA: Diagnosis not present

## 2024-01-06 NOTE — Addendum Note (Signed)
 Addended by: Hyman Hopes B on: 01/06/2024 10:27 AM   Modules accepted: Orders

## 2024-01-06 NOTE — Telephone Encounter (Addendum)
 Contacted patient to notify have resent referral [facility] and gave her their phone number.  Patient called back number was incorrect. Patient called to correct number, they have the referral and is scheduled 01/07/24.

## 2024-01-07 DIAGNOSIS — G43709 Chronic migraine without aura, not intractable, without status migrainosus: Secondary | ICD-10-CM | POA: Diagnosis not present

## 2024-01-07 DIAGNOSIS — R519 Headache, unspecified: Secondary | ICD-10-CM | POA: Diagnosis not present

## 2024-01-15 ENCOUNTER — Other Ambulatory Visit: Payer: Self-pay | Admitting: Family Medicine

## 2024-01-15 DIAGNOSIS — F411 Generalized anxiety disorder: Secondary | ICD-10-CM | POA: Diagnosis not present

## 2024-01-15 DIAGNOSIS — R519 Headache, unspecified: Secondary | ICD-10-CM

## 2024-01-21 DIAGNOSIS — G43709 Chronic migraine without aura, not intractable, without status migrainosus: Secondary | ICD-10-CM | POA: Diagnosis not present

## 2024-01-21 DIAGNOSIS — R519 Headache, unspecified: Secondary | ICD-10-CM | POA: Diagnosis not present

## 2024-01-23 DIAGNOSIS — F411 Generalized anxiety disorder: Secondary | ICD-10-CM | POA: Diagnosis not present

## 2024-01-27 DIAGNOSIS — F411 Generalized anxiety disorder: Secondary | ICD-10-CM | POA: Diagnosis not present

## 2024-02-03 DIAGNOSIS — F411 Generalized anxiety disorder: Secondary | ICD-10-CM | POA: Diagnosis not present

## 2024-02-17 DIAGNOSIS — F411 Generalized anxiety disorder: Secondary | ICD-10-CM | POA: Diagnosis not present

## 2024-02-18 DIAGNOSIS — G43709 Chronic migraine without aura, not intractable, without status migrainosus: Secondary | ICD-10-CM | POA: Diagnosis not present

## 2024-02-18 DIAGNOSIS — R519 Headache, unspecified: Secondary | ICD-10-CM | POA: Diagnosis not present

## 2024-03-02 DIAGNOSIS — F411 Generalized anxiety disorder: Secondary | ICD-10-CM | POA: Diagnosis not present

## 2024-03-09 DIAGNOSIS — F411 Generalized anxiety disorder: Secondary | ICD-10-CM | POA: Diagnosis not present

## 2024-03-11 DIAGNOSIS — F3289 Other specified depressive episodes: Secondary | ICD-10-CM | POA: Diagnosis not present

## 2024-03-11 DIAGNOSIS — B372 Candidiasis of skin and nail: Secondary | ICD-10-CM | POA: Diagnosis not present

## 2024-03-11 DIAGNOSIS — N951 Menopausal and female climacteric states: Secondary | ICD-10-CM | POA: Diagnosis not present

## 2024-03-12 ENCOUNTER — Encounter: Payer: Self-pay | Admitting: Family Medicine

## 2024-03-12 DIAGNOSIS — R519 Headache, unspecified: Secondary | ICD-10-CM

## 2024-03-12 MED ORDER — BACLOFEN 10 MG PO TABS: 10.0000 mg | ORAL_TABLET | Freq: Three times a day (TID) | ORAL | 2 refills | Status: DC

## 2024-03-16 DIAGNOSIS — F411 Generalized anxiety disorder: Secondary | ICD-10-CM | POA: Diagnosis not present

## 2024-03-17 DIAGNOSIS — R519 Headache, unspecified: Secondary | ICD-10-CM | POA: Diagnosis not present

## 2024-03-17 DIAGNOSIS — G43709 Chronic migraine without aura, not intractable, without status migrainosus: Secondary | ICD-10-CM | POA: Diagnosis not present

## 2024-03-23 DIAGNOSIS — F411 Generalized anxiety disorder: Secondary | ICD-10-CM | POA: Diagnosis not present

## 2024-03-23 DIAGNOSIS — F418 Other specified anxiety disorders: Secondary | ICD-10-CM | POA: Diagnosis not present

## 2024-03-23 DIAGNOSIS — F331 Major depressive disorder, recurrent, moderate: Secondary | ICD-10-CM | POA: Diagnosis not present

## 2024-03-23 DIAGNOSIS — F4312 Post-traumatic stress disorder, chronic: Secondary | ICD-10-CM | POA: Diagnosis not present

## 2024-04-08 DIAGNOSIS — F411 Generalized anxiety disorder: Secondary | ICD-10-CM | POA: Diagnosis not present

## 2024-04-15 DIAGNOSIS — F411 Generalized anxiety disorder: Secondary | ICD-10-CM | POA: Diagnosis not present

## 2024-04-16 DIAGNOSIS — R519 Headache, unspecified: Secondary | ICD-10-CM | POA: Diagnosis not present

## 2024-04-16 DIAGNOSIS — G43709 Chronic migraine without aura, not intractable, without status migrainosus: Secondary | ICD-10-CM | POA: Diagnosis not present

## 2024-04-18 DIAGNOSIS — H52223 Regular astigmatism, bilateral: Secondary | ICD-10-CM | POA: Diagnosis not present

## 2024-04-22 DIAGNOSIS — F411 Generalized anxiety disorder: Secondary | ICD-10-CM | POA: Diagnosis not present

## 2024-04-29 DIAGNOSIS — F411 Generalized anxiety disorder: Secondary | ICD-10-CM | POA: Diagnosis not present

## 2024-05-06 DIAGNOSIS — N951 Menopausal and female climacteric states: Secondary | ICD-10-CM | POA: Diagnosis not present

## 2024-05-06 DIAGNOSIS — F411 Generalized anxiety disorder: Secondary | ICD-10-CM | POA: Diagnosis not present

## 2024-05-13 DIAGNOSIS — F411 Generalized anxiety disorder: Secondary | ICD-10-CM | POA: Diagnosis not present

## 2024-05-14 DIAGNOSIS — R519 Headache, unspecified: Secondary | ICD-10-CM | POA: Diagnosis not present

## 2024-05-14 DIAGNOSIS — G43709 Chronic migraine without aura, not intractable, without status migrainosus: Secondary | ICD-10-CM | POA: Diagnosis not present

## 2024-05-20 DIAGNOSIS — F411 Generalized anxiety disorder: Secondary | ICD-10-CM | POA: Diagnosis not present

## 2024-05-27 ENCOUNTER — Other Ambulatory Visit: Payer: Self-pay | Admitting: Family Medicine

## 2024-05-27 DIAGNOSIS — F411 Generalized anxiety disorder: Secondary | ICD-10-CM | POA: Diagnosis not present

## 2024-05-27 DIAGNOSIS — I10 Essential (primary) hypertension: Secondary | ICD-10-CM

## 2024-06-03 DIAGNOSIS — F411 Generalized anxiety disorder: Secondary | ICD-10-CM | POA: Diagnosis not present

## 2024-06-10 ENCOUNTER — Ambulatory Visit: Admitting: Neurology

## 2024-06-10 DIAGNOSIS — N951 Menopausal and female climacteric states: Secondary | ICD-10-CM | POA: Diagnosis not present

## 2024-06-10 DIAGNOSIS — N393 Stress incontinence (female) (male): Secondary | ICD-10-CM | POA: Diagnosis not present

## 2024-06-10 DIAGNOSIS — F411 Generalized anxiety disorder: Secondary | ICD-10-CM | POA: Diagnosis not present

## 2024-06-10 DIAGNOSIS — R32 Unspecified urinary incontinence: Secondary | ICD-10-CM | POA: Diagnosis not present

## 2024-06-15 DIAGNOSIS — F331 Major depressive disorder, recurrent, moderate: Secondary | ICD-10-CM | POA: Diagnosis not present

## 2024-06-15 DIAGNOSIS — F4312 Post-traumatic stress disorder, chronic: Secondary | ICD-10-CM | POA: Diagnosis not present

## 2024-06-15 DIAGNOSIS — F418 Other specified anxiety disorders: Secondary | ICD-10-CM | POA: Diagnosis not present

## 2024-06-17 DIAGNOSIS — F411 Generalized anxiety disorder: Secondary | ICD-10-CM | POA: Diagnosis not present

## 2024-06-22 DIAGNOSIS — F411 Generalized anxiety disorder: Secondary | ICD-10-CM | POA: Diagnosis not present

## 2024-06-24 DIAGNOSIS — F411 Generalized anxiety disorder: Secondary | ICD-10-CM | POA: Diagnosis not present

## 2024-06-24 DIAGNOSIS — Z1231 Encounter for screening mammogram for malignant neoplasm of breast: Secondary | ICD-10-CM | POA: Diagnosis not present

## 2024-06-25 DIAGNOSIS — G43709 Chronic migraine without aura, not intractable, without status migrainosus: Secondary | ICD-10-CM | POA: Diagnosis not present

## 2024-06-25 DIAGNOSIS — R519 Headache, unspecified: Secondary | ICD-10-CM | POA: Diagnosis not present

## 2024-06-28 DIAGNOSIS — F411 Generalized anxiety disorder: Secondary | ICD-10-CM | POA: Diagnosis not present

## 2024-06-30 ENCOUNTER — Ambulatory Visit: Admitting: Family Medicine

## 2024-07-02 DIAGNOSIS — F411 Generalized anxiety disorder: Secondary | ICD-10-CM | POA: Diagnosis not present

## 2024-07-12 DIAGNOSIS — F411 Generalized anxiety disorder: Secondary | ICD-10-CM | POA: Diagnosis not present

## 2024-07-13 DIAGNOSIS — F411 Generalized anxiety disorder: Secondary | ICD-10-CM | POA: Diagnosis not present

## 2024-07-22 DIAGNOSIS — R519 Headache, unspecified: Secondary | ICD-10-CM | POA: Diagnosis not present

## 2024-07-22 DIAGNOSIS — F411 Generalized anxiety disorder: Secondary | ICD-10-CM | POA: Diagnosis not present

## 2024-07-22 DIAGNOSIS — G43709 Chronic migraine without aura, not intractable, without status migrainosus: Secondary | ICD-10-CM | POA: Diagnosis not present

## 2024-07-29 DIAGNOSIS — F411 Generalized anxiety disorder: Secondary | ICD-10-CM | POA: Diagnosis not present

## 2024-08-05 DIAGNOSIS — F411 Generalized anxiety disorder: Secondary | ICD-10-CM | POA: Diagnosis not present

## 2024-08-05 DIAGNOSIS — F4312 Post-traumatic stress disorder, chronic: Secondary | ICD-10-CM | POA: Diagnosis not present

## 2024-08-05 DIAGNOSIS — F331 Major depressive disorder, recurrent, moderate: Secondary | ICD-10-CM | POA: Diagnosis not present

## 2024-08-05 DIAGNOSIS — F418 Other specified anxiety disorders: Secondary | ICD-10-CM | POA: Diagnosis not present

## 2024-08-10 DIAGNOSIS — F411 Generalized anxiety disorder: Secondary | ICD-10-CM | POA: Diagnosis not present

## 2024-08-12 DIAGNOSIS — F411 Generalized anxiety disorder: Secondary | ICD-10-CM | POA: Diagnosis not present

## 2024-08-17 DIAGNOSIS — F411 Generalized anxiety disorder: Secondary | ICD-10-CM | POA: Diagnosis not present

## 2024-08-19 DIAGNOSIS — F411 Generalized anxiety disorder: Secondary | ICD-10-CM | POA: Diagnosis not present

## 2024-08-23 DIAGNOSIS — F411 Generalized anxiety disorder: Secondary | ICD-10-CM | POA: Diagnosis not present

## 2024-08-27 ENCOUNTER — Other Ambulatory Visit: Payer: Self-pay | Admitting: Family Medicine

## 2024-08-27 DIAGNOSIS — I10 Essential (primary) hypertension: Secondary | ICD-10-CM

## 2024-08-31 DIAGNOSIS — F411 Generalized anxiety disorder: Secondary | ICD-10-CM | POA: Diagnosis not present

## 2024-09-02 DIAGNOSIS — F411 Generalized anxiety disorder: Secondary | ICD-10-CM | POA: Diagnosis not present

## 2024-09-07 DIAGNOSIS — F411 Generalized anxiety disorder: Secondary | ICD-10-CM | POA: Diagnosis not present

## 2024-09-09 DIAGNOSIS — F411 Generalized anxiety disorder: Secondary | ICD-10-CM | POA: Diagnosis not present

## 2024-09-14 DIAGNOSIS — F411 Generalized anxiety disorder: Secondary | ICD-10-CM | POA: Diagnosis not present

## 2024-09-16 DIAGNOSIS — F411 Generalized anxiety disorder: Secondary | ICD-10-CM | POA: Diagnosis not present

## 2024-09-21 DIAGNOSIS — F411 Generalized anxiety disorder: Secondary | ICD-10-CM | POA: Diagnosis not present

## 2024-09-26 NOTE — Progress Notes (Incomplete)
 "  Acute Office Visit  Subjective:  Patient ID: Jessica Molina, female    DOB: [birth year]  Age: 48 y.o. MRN: [id number]  CC: No chief complaint on file.     HPI Jessica Molina is here for high blood pressure.    Hypertension: - Medications: HCTZ 25 mg daily. - Compliance: *** - Checking BP at home: *** - Denies any SOB, recurrent headaches, CP, vision changes, LE edema, dizziness, palpitations, or medication side effects. 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, repaired with no special restrictions or treatments to follow   Anxiety    GERD (gastroesophageal reflux disease)    Hypertension    Migraine    PONV (postoperative nausea and vomiting)    hard to wake up after appendectomy   Renal vein stenosis    Kinked    Past Surgical History:  Procedure Laterality Date   ABDOMINAL HYSTERECTOMY     ANEURYSM SURGERY  01/2015   APPENDECTOMY     BTL  2006   CESAREAN SECTION     Twins,  BTL   COLONOSCOPY     Over 20 years in [place]    CYSTOSCOPY N/A 06/25/2016   Procedure: CYSTOSCOPY;  Surgeon: Evalene SHAUNNA Organ, MD;  Location: [facility];  Service: Gynecology;  Laterality: N/A;   LAPAROSCOPIC VAGINAL HYSTERECTOMY WITH SALPINGECTOMY Bilateral 06/25/2016   Procedure: LAPAROSCOPIC ASSISTED VAGINAL HYSTERECTOMY WITH Bilateral SALPINGECTOMY, Lysis of Adhesions;  Surgeon: Evalene SHAUNNA Organ, MD;  Location: [facility];  Service: Gynecology;  Laterality: Bilateral;   RENAL ANGIOPLASTY     x2   TUBAL LIGATION      Family History  Problem Relation Age of Onset   Breast cancer Mother        breast    Thyroid  disease Mother    Cancer Mother 84       breast   Hyperlipidemia Mother    Hypertension Father    Stroke Father    Melanoma Father        melanoma it is terminal    Cancer Father         melanoma   Breast cancer Maternal Grandmother        breast    Thyroid  disease Maternal Grandmother    Cancer Maternal Grandfather        unknown    Cancer Paternal Grandmother        unknow    Colon cancer Neg Hx    Esophageal cancer Neg Hx    Esophageal varices Neg Hx    Stomach cancer Neg Hx     Social History   Socioeconomic History   Marital status: Married    Spouse name: Not on file   Number of children: 4   Years of education: HS+   Highest education level: Associate degree: occupational, scientist, product/process development, or vocational program  Occupational History   Occupation: psychologist, sport and exercise  Tobacco Use   Smoking status: Never   Smokeless tobacco: Never  Vaping Use   Vaping status: Never Used  Substance and Sexual Activity   Alcohol use: Not Currently    Alcohol/week: 0.0 - 3.0 standard drinks of alcohol   Drug use: No   Sexual activity: Not Currently    Partners: Male    Birth control/protection:  Surgical    Comment: hysterectomy  Other Topics Concern   Not on file  Social History Narrative   Lives at home with husband and children.   Right-handed.   2-3 cups caffeine per week.   Social Drivers of Health   Tobacco Use: Low Risk (12/09/2023)   Patient History    Smoking Tobacco Use: Never    Smokeless Tobacco Use: Never    Passive Exposure: Not on file  Financial Resource Strain: Medium Risk (12/01/2023)   Overall Financial Resource Strain (CARDIA)    Difficulty of Paying Living Expenses: Somewhat hard  Food Insecurity: Food Insecurity Present (12/01/2023)   Hunger Vital Sign    Worried About Running Out of Food in the Last Year: Sometimes true    Ran Out of Food in the Last Year: Sometimes true  Transportation Needs: No Transportation Needs (12/01/2023)   PRAPARE - Administrator, Civil Service (Medical): No    Lack of Transportation (Non-Medical): No  Physical Activity: Sufficiently Active (12/01/2023)   Exercise Vital Sign    Days of Exercise per Week: 7 days     Minutes of Exercise per Session: 120 min  Stress: Stress Concern Present (12/01/2023)   [employer] of Occupational Health - Occupational Stress Questionnaire    Feeling of Stress : Very much  Social Connections: Unknown (12/01/2023)   Social Connection and Isolation Panel    Frequency of Communication with Friends and Family: Never    Frequency of Social Gatherings with Friends and Family: Never    Attends Religious Services: Never    Database Administrator or Organizations: No    Attends Engineer, Structural: Not on file    Marital Status: Patient declined  Intimate Partner Violence: Not on file  Depression (PHQ2-9): Low Risk (06/04/2023)   Depression (PHQ2-9)    PHQ-2 Score: 3  Alcohol Screen: Low Risk (06/16/2023)   Alcohol Screen    Last Alcohol Screening Score (AUDIT): 1  Housing: High Risk (12/01/2023)   Housing Stability Vital Sign    Unable to Pay for Housing in the Last Year: Yes    Number of Times Moved in the Last Year: Not on file    Homeless in the Last Year: No  Utilities: Not on file  Health Literacy: Not on file    ROS All ROS negative except what is listed in the HPI.   Objective:   Today's Vitals: LMP 06/19/2016   Physical Exam  Assessment & Plan:   Problem List Items Addressed This Visit   None     Follow-up: No follow-ups on file.   Waddell FURY Almarie, DNP, FNP-C  I,Emily Lagle,acting as a neurosurgeon for Waddell KATHEE Almarie, NP.,have documented all relevant documentation on the behalf of Waddell KATHEE Almarie, NP.   I, Waddell KATHEE Almarie, NP, have reviewed all documentation for this visit. The documentation on 10/01/2024 for the exam, diagnosis, procedures, and orders are all accurate and complete. "

## 2024-10-01 ENCOUNTER — Ambulatory Visit: Admitting: Family Medicine

## 2024-10-11 NOTE — Progress Notes (Signed)
 "  Acute Office Visit  Subjective:  Patient ID: Jessica Molina, female    DOB: 1976/02/10  Age: 49 y.o. MRN: [id number]  CC: No chief complaint on file.     HPI Jessica Molina is here for high blood pressure.    Hypertension: - Medications: HCTZ 25 mg daily. - Compliance: *** - Checking BP at home: *** - Denies any SOB, recurrent headaches, CP, vision changes, LE edema, dizziness, palpitations, or medication side effects. 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, repaired with no special restrictions or treatments to follow   Anxiety    GERD (gastroesophageal reflux disease)    Hypertension    Migraine    PONV (postoperative nausea and vomiting)    hard to wake up after appendectomy   Renal vein stenosis    Kinked    Past Surgical History:  Procedure Laterality Date   ABDOMINAL HYSTERECTOMY     ANEURYSM SURGERY  01/2015   APPENDECTOMY     BTL  2006   CESAREAN SECTION     Twins,  BTL   COLONOSCOPY     Over 20 years in [place]    CYSTOSCOPY N/A 06/25/2016   Procedure: CYSTOSCOPY;  Surgeon: Evalene SHAUNNA Organ, MD;  Location: [facility];  Service: Gynecology;  Laterality: N/A;   LAPAROSCOPIC VAGINAL HYSTERECTOMY WITH SALPINGECTOMY Bilateral 06/25/2016   Procedure: LAPAROSCOPIC ASSISTED VAGINAL HYSTERECTOMY WITH Bilateral SALPINGECTOMY, Lysis of Adhesions;  Surgeon: Evalene SHAUNNA Organ, MD;  Location: [facility];  Service: Gynecology;  Laterality: Bilateral;   RENAL ANGIOPLASTY     x2   TUBAL LIGATION      Family History  Problem Relation Age of Onset   Breast cancer Mother        breast    Thyroid  disease Mother    Cancer Mother 76       breast   Hyperlipidemia Mother    Hypertension Father    Stroke Father    Melanoma Father        melanoma it is terminal    Cancer Father         melanoma   Breast cancer Maternal Grandmother        breast    Thyroid  disease Maternal Grandmother    Cancer Maternal Grandfather        unknown    Cancer Paternal Grandmother        unknow    Colon cancer Neg Hx    Esophageal cancer Neg Hx    Esophageal varices Neg Hx    Stomach cancer Neg Hx     Social History   Socioeconomic History   Marital status: Married    Spouse name: Not on file   Number of children: 4   Years of education: HS+   Highest education level: Associate degree: occupational, scientist, product/process development, or vocational program  Occupational History   Occupation: psychologist, sport and exercise  Tobacco Use   Smoking status: Never   Smokeless tobacco: Never  Vaping Use   Vaping status: Never Used  Substance and Sexual Activity   Alcohol use: Not Currently    Alcohol/week: 0.0 - 3.0 standard drinks of alcohol   Drug use: No   Sexual activity: Not Currently    Partners: Male    Birth control/protection:  Surgical    Comment: hysterectomy  Other Topics Concern   Not on file  Social History Narrative   Lives at home with husband and children.   Right-handed.   2-3 cups caffeine per week.   Social Drivers of Health   Tobacco Use: Low Risk (12/09/2023)   Patient History    Smoking Tobacco Use: Never    Smokeless Tobacco Use: Never    Passive Exposure: Not on file  Financial Resource Strain: Medium Risk (12/01/2023)   Overall Financial Resource Strain (CARDIA)    Difficulty of Paying Living Expenses: Somewhat hard  Food Insecurity: Food Insecurity Present (12/01/2023)   Hunger Vital Sign    Worried About Running Out of Food in the Last Year: Sometimes true    Ran Out of Food in the Last Year: Sometimes true  Transportation Needs: No Transportation Needs (12/01/2023)   PRAPARE - Administrator, Civil Service (Medical): No    Lack of Transportation (Non-Medical): No  Physical Activity: Sufficiently Active (12/01/2023)   Exercise Vital Sign    Days of Exercise per Week: 7 days     Minutes of Exercise per Session: 120 min  Stress: Stress Concern Present (12/01/2023)   [employer] of Occupational Health - Occupational Stress Questionnaire    Feeling of Stress : Very much  Social Connections: Unknown (12/01/2023)   Social Connection and Isolation Panel    Frequency of Communication with Friends and Family: Never    Frequency of Social Gatherings with Friends and Family: Never    Attends Religious Services: Never    Database Administrator or Organizations: No    Attends Engineer, Structural: Not on file    Marital Status: Patient declined  Intimate Partner Violence: Not on file  Depression (PHQ2-9): Low Risk (06/04/2023)   Depression (PHQ2-9)    PHQ-2 Score: 3  Alcohol Screen: Low Risk (06/16/2023)   Alcohol Screen    Last Alcohol Screening Score (AUDIT): 1  Housing: High Risk (12/01/2023)   Housing Stability Vital Sign    Unable to Pay for Housing in the Last Year: Yes    Number of Times Moved in the Last Year: Not on file    Homeless in the Last Year: No  Utilities: Not on file  Health Literacy: Not on file    ROS All ROS negative except what is listed in the HPI.   Objective:   Today's Vitals: LMP 06/19/2016   Physical Exam  Assessment & Plan:   Problem List Items Addressed This Visit   None     Follow-up: No follow-ups on file.   Waddell FURY Almarie, DNP, FNP-C  I,Emily Lagle,acting as a neurosurgeon for Waddell KATHEE Almarie, NP.,have documented all relevant documentation on the behalf of Waddell KATHEE Almarie, NP.   I, Waddell KATHEE Almarie, NP, have reviewed all documentation for this visit. The documentation on 10/13/2024 for the exam, diagnosis, procedures, and orders are all accurate and complete. "

## 2024-10-13 ENCOUNTER — Ambulatory Visit: Admitting: Family Medicine

## 2024-10-13 VITALS — BP 149/78 | HR 72 | Ht 63.0 in | Wt 133.0 lb

## 2024-10-13 DIAGNOSIS — E782 Mixed hyperlipidemia: Secondary | ICD-10-CM

## 2024-10-13 DIAGNOSIS — I10 Essential (primary) hypertension: Secondary | ICD-10-CM

## 2024-10-13 MED ORDER — VALSARTAN 80 MG PO TABS: 80.0000 mg | ORAL_TABLET | Freq: Every day | ORAL | 3 refills | Status: AC

## 2024-10-13 NOTE — Assessment & Plan Note (Signed)
Hx of elevated lipids Repeat labs today

## 2024-10-13 NOTE — Assessment & Plan Note (Signed)
 Blood pressure is not at goal for age and co-morbidities.   Recommendations: continue HCTZ 25 mg daily, add valsartan  80 mg daily, nurse visit for recheck in 2 weeks - BP goal <130/80 - monitor and log blood pressures at home - check around the same time each day in a relaxed setting - Limit salt to <2000 mg/day - Follow DASH eating plan (heart healthy diet) - limit alcohol to 2 standard drinks per day for men and 1 per day for women - avoid tobacco products - get at least 2 hours of regular aerobic exercise weekly Patient aware of signs/symptoms requiring further/urgent evaluation.

## 2024-10-14 ENCOUNTER — Ambulatory Visit: Payer: Self-pay | Admitting: Family Medicine

## 2024-10-14 LAB — CBC WITH DIFFERENTIAL/PLATELET
Basophils Absolute: 0.1 K/uL (ref 0.0–0.1)
Basophils Relative: 0.8 % (ref 0.0–3.0)
Eosinophils Absolute: 0 K/uL (ref 0.0–0.7)
Eosinophils Relative: 0.2 % (ref 0.0–5.0)
HCT: 43 % (ref 36.0–46.0)
Hemoglobin: 14.8 g/dL (ref 12.0–15.0)
Lymphocytes Relative: 30.7 % (ref 12.0–46.0)
Lymphs Abs: 2.7 K/uL (ref 0.7–4.0)
MCHC: 34.3 g/dL (ref 30.0–36.0)
MCV: 92.7 fl (ref 78.0–100.0)
Monocytes Absolute: 0.4 K/uL (ref 0.1–1.0)
Monocytes Relative: 4.9 % (ref 3.0–12.0)
Neutro Abs: 5.6 K/uL (ref 1.4–7.7)
Neutrophils Relative %: 63.4 % (ref 43.0–77.0)
Platelets: 335 K/uL (ref 150.0–400.0)
RBC: 4.64 Mil/uL (ref 3.87–5.11)
RDW: 11.9 % (ref 11.5–15.5)
WBC: 8.8 K/uL (ref 4.0–10.5)

## 2024-10-14 LAB — LIPID PANEL
Cholesterol: 211 mg/dL — ABNORMAL HIGH (ref 28–200)
HDL: 81.8 mg/dL
LDL Cholesterol: 115 mg/dL — ABNORMAL HIGH (ref 10–99)
NonHDL: 128.8
Total CHOL/HDL Ratio: 3
Triglycerides: 68 mg/dL (ref 10.0–149.0)
VLDL: 13.6 mg/dL (ref 0.0–40.0)

## 2024-10-14 LAB — COMPREHENSIVE METABOLIC PANEL WITH GFR
ALT: 13 U/L (ref 3–35)
AST: 21 U/L (ref 5–37)
Albumin: 4.8 g/dL (ref 3.5–5.2)
Alkaline Phosphatase: 57 U/L (ref 39–117)
BUN: 14 mg/dL (ref 6–23)
CO2: 28 meq/L (ref 19–32)
Calcium: 10.1 mg/dL (ref 8.4–10.5)
Chloride: 99 meq/L (ref 96–112)
Creatinine, Ser: 0.76 mg/dL (ref 0.40–1.20)
GFR: 92.1 mL/min
Glucose, Bld: 88 mg/dL (ref 70–99)
Potassium: 3.6 meq/L (ref 3.5–5.1)
Sodium: 137 meq/L (ref 135–145)
Total Bilirubin: 0.6 mg/dL (ref 0.2–1.2)
Total Protein: 7.9 g/dL (ref 6.0–8.3)

## 2024-10-14 LAB — TSH: TSH: 0.82 u[IU]/mL (ref 0.35–5.50)

## 2024-10-29 ENCOUNTER — Ambulatory Visit
